# Patient Record
Sex: Female | Born: 1965 | Race: Asian | Hispanic: No | Smoking: Never smoker
Health system: Southern US, Community
[De-identification: ages and names within clinical notes are randomized; demographics above are authoritative.]

## PROBLEM LIST (undated history)

## (undated) DIAGNOSIS — E039 Hypothyroidism, unspecified: Secondary | ICD-10-CM

## (undated) DIAGNOSIS — D649 Anemia, unspecified: Secondary | ICD-10-CM

## (undated) DIAGNOSIS — J45909 Unspecified asthma, uncomplicated: Secondary | ICD-10-CM

## (undated) DIAGNOSIS — E079 Disorder of thyroid, unspecified: Secondary | ICD-10-CM

## (undated) HISTORY — DX: Hypothyroidism, unspecified: E03.9

## (undated) HISTORY — DX: Unspecified asthma, uncomplicated: J45.909

---

## 1993-01-21 HISTORY — PX: TUBAL LIGATION: SHX77

## 1995-01-22 HISTORY — PX: THYROID SURGERY: SHX805

## 1998-04-20 ENCOUNTER — Other Ambulatory Visit: Admission: RE | Admit: 1998-04-20 | Discharge: 1998-04-20 | Payer: Self-pay | Admitting: Obstetrics and Gynecology

## 1998-12-05 ENCOUNTER — Encounter: Admission: RE | Admit: 1998-12-05 | Discharge: 1998-12-05 | Payer: Self-pay | Admitting: Family Medicine

## 1998-12-05 ENCOUNTER — Encounter: Payer: Self-pay | Admitting: Family Medicine

## 2000-05-29 ENCOUNTER — Encounter: Admission: RE | Admit: 2000-05-29 | Discharge: 2000-05-29 | Payer: Self-pay | Admitting: Family Medicine

## 2000-05-29 ENCOUNTER — Encounter: Payer: Self-pay | Admitting: Family Medicine

## 2000-05-30 ENCOUNTER — Emergency Department (HOSPITAL_COMMUNITY): Admission: EM | Admit: 2000-05-30 | Discharge: 2000-05-30 | Payer: Self-pay

## 2000-06-02 ENCOUNTER — Encounter (INDEPENDENT_AMBULATORY_CARE_PROVIDER_SITE_OTHER): Payer: Self-pay | Admitting: Specialist

## 2000-06-02 ENCOUNTER — Ambulatory Visit (HOSPITAL_COMMUNITY): Admission: RE | Admit: 2000-06-02 | Discharge: 2000-06-02 | Payer: Self-pay | Admitting: Gastroenterology

## 2003-11-17 ENCOUNTER — Ambulatory Visit: Payer: Self-pay | Admitting: Internal Medicine

## 2004-05-16 ENCOUNTER — Ambulatory Visit: Payer: Self-pay | Admitting: Internal Medicine

## 2006-05-22 ENCOUNTER — Ambulatory Visit: Payer: Self-pay | Admitting: Cardiovascular Disease

## 2006-05-28 ENCOUNTER — Ambulatory Visit: Payer: Self-pay | Admitting: Internal Medicine

## 2006-06-03 ENCOUNTER — Ambulatory Visit: Payer: Self-pay

## 2006-06-03 ENCOUNTER — Encounter: Payer: Self-pay | Admitting: Cardiology

## 2006-06-03 LAB — CBC WITH DIFFERENTIAL/PLATELET
BASO%: 2.5 % — ABNORMAL HIGH (ref 0.0–2.0)
Basophils Absolute: 0.1 10*3/uL (ref 0.0–0.1)
EOS%: 2.5 % (ref 0.0–7.0)
Eosinophils Absolute: 0.1 10*3/uL (ref 0.0–0.5)
HCT: 32.9 % — ABNORMAL LOW (ref 34.8–46.6)
HGB: 9.8 g/dL — ABNORMAL LOW (ref 11.6–15.9)
LYMPH%: 36.6 % (ref 14.0–48.0)
MCH: 18 pg — ABNORMAL LOW (ref 26.0–34.0)
MCHC: 29.7 g/dL — ABNORMAL LOW (ref 32.0–36.0)
MCV: 60.6 fL — ABNORMAL LOW (ref 81.0–101.0)
MONO#: 0.3 10*3/uL (ref 0.1–0.9)
MONO%: 7.6 % (ref 0.0–13.0)
NEUT#: 2.1 10*3/uL (ref 1.5–6.5)
NEUT%: 50.8 % (ref 39.6–76.8)
Platelets: 294 10*3/uL (ref 145–400)
RBC: 5.44 10*6/uL — ABNORMAL HIGH (ref 3.70–5.32)
RDW: 15 % — ABNORMAL HIGH (ref 11.3–14.5)
WBC: 4.2 10*3/uL (ref 3.9–10.0)
lymph#: 1.5 10*3/uL (ref 0.9–3.3)

## 2006-06-05 LAB — COMPREHENSIVE METABOLIC PANEL
BUN: 11 mg/dL (ref 6–23)
CO2: 24 mEq/L (ref 19–32)
Creatinine, Ser: 0.79 mg/dL (ref 0.40–1.20)
Glucose, Bld: 87 mg/dL (ref 70–99)
Total Bilirubin: 1 mg/dL (ref 0.3–1.2)

## 2006-06-05 LAB — IRON AND TIBC
TIBC: 416 ug/dL (ref 250–470)
UIBC: 189 ug/dL

## 2006-06-05 LAB — VITAMIN B12: Vitamin B-12: 372 pg/mL (ref 211–911)

## 2006-06-05 LAB — PROTEIN ELECTROPHORESIS, SERUM
Albumin ELP: 58.9 % (ref 55.8–66.1)
Total Protein, Serum Electrophoresis: 7.1 g/dL (ref 6.0–8.3)

## 2006-06-05 LAB — FERRITIN: Ferritin: 30 ng/mL (ref 10–291)

## 2006-06-05 LAB — LACTATE DEHYDROGENASE: LDH: 110 U/L (ref 94–250)

## 2009-08-01 ENCOUNTER — Ambulatory Visit (HOSPITAL_COMMUNITY): Admission: RE | Admit: 2009-08-01 | Discharge: 2009-08-01 | Payer: Self-pay | Admitting: Family Medicine

## 2010-06-05 NOTE — Assessment & Plan Note (Signed)
[facility]                            CARDIOLOGY OFFICE NOTE   Geralynn Ochs              MRN:          [id number]  DATE:05/22/2006                            DOB:          [birth year]    Ms. Beverly Campbell is a pleasant 45 year old [place] refugee who is  referred for chest pain and shortness of breath.  Her chest pain is  atypical, she has had it for 6 months.  She feels a pinching in her  chest.  It is not associated with diaphoresis.  She has had increasing  exertional dyspnea.  She actually finds that when she goes to the gym or  bowls the pain is less noticeable.   The pain can be fleeting, it does not last but a minute or two.  However, she feels it has been progressive over the last 6 months.   She has not had any significant musculoskeletal trauma.   There has been no previous history of cardiac problems.  The patient  does have a history of anemia.  Apparently she saw a hematologist many  years ago.  She had lab work done at [facility].  Of note is a  significant anemia.  Her hemoglobin was only 8.5, her hematocrit was  26.8 and her MCV was quite low at 57.8.   The patient had attributed this in the past to a different reference  value for agents and heavy periods.   From a cardiac perspective otherwise, she has not had any significant  PND or orthopnea, she has not had any significant syncope or  palpitations.  She has noticed some increasing lower extremity edema  over the last week.   There is no history of DVT or PE.   REVIEW OF SYSTEMS:  Otherwise negative.   PAST MEDICAL HISTORY:  Remarkable for occasional headaches.   She is a nonsmoker, nondrinker.  She has no known allergies.  She has  had a previous thyroid nodule removed in 1988 and has been on thyroid  replacement.  She has had a previous tubal ligation.   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  She is divorced.  She has  3 children.  She enjoys bowling and walking  when she has time.   There is a little bit of stress in regards to everything that she does  in raising the 3 kids.   Her mother is alive and has high blood pressure at age 34.  Father died  of colon cancer at age 69.   MEDS:  1. Levoxyl 88 mcg a day.  2. Multivitamins.   She has not started iron yet.   EXAM:  Remarkable for a healthy appearing Asian woman in no distress.  Her vital signs are remarkable for a weight of 156, blood pressure is  114/70, she is not postural.  Pulse 73 and regular, she is afebrile,  respiratory rate is 12.  HEENT:  Normal.  She has a previous thyroidectomy scar.  There is no  thyromegaly currently.  There is no bruits, there is no JVP elevation.  LUNGS:  Clear.  She is not using  accessory muscles.  There is an S1, S2  with normal heart sounds.  PMI is normal.  ABDOMEN:  Benign, I do not feel any masses.  There is no  hepatosplenomegaly.  No AAA.  No hepatojugular reflux.  Distal pulses are intact.  There is no edema, there is no bruits.  Pulses are +3 bilaterally.  SKIN:  Normal in coloration.  There is no particular pallor.  NEUROLOGICAL EXAM:  Unremarkable.  MUSCULAR EXAM:  Normal.  I can actually find no edema on her despite her  subjective complaints.   Her electrocardiogram is totally normal.   I did review the remainder of her labs from [facility].  Her TSH was  normal and she did not have an elevated BUN to go with her anemia.   IMPRESSION:  The patient's chest pain is atypical, it is not always  exertional, it is sharp, it is fleeting, I do not think it is anginal.  If she does have anginal type pain it may actually be from her fairly  significant anemia.  She will be referred for stress Myoview testing.   I would also like to do a 2D echocardiogram to further assess her LV and  RV function in the setting of dyspnea.   More importantly, I would like to refer her to Hematology.  I  encouraged  her to start taking iron.  We will get a serum ferritin and iron level  as well as a TIBC and a retic count.   She clearly has a microcytic anemia which is somewhat profound to say  that it is only an ethnic variation with every menorrhagia.   The patient will follow up with me.  She will see the hematologist and  have further workup for anemia. We will check an Iron, TIBC, Ferritin  and Reticulocyte count    Peter C. Eden Emms, MD, [facility]  Electronically Signed   PCN/MedQ  DD: 05/22/2006  DT: 05/22/2006  Job #: [id number]   cc:   Gabriel Earing, M.D.

## 2010-06-08 NOTE — Procedures (Signed)
[place]  Patient:    Beverly Campbell, Beverly Campbell           MRN: [id number] Proc. Date: 06/02/00 Adm. Date:  [id number] Attending:  Orland Mustard CC:         Crista Luria, M.D., [facility]   Procedure Report  PROCEDURE:  Esophagogastroduodenoscopy and biopsy.  MEDICATIONS:  Hurricaine spray, fentanyl 20 mcg, Versed 3 mg IV.  INDICATION:  Patient with abdominal pain that has been persistent. Gallbladder ultrasound was negative.  DESCRIPTION OF PROCEDURE:  The procedure had been explained to the patient and consent obtained.  Patient in left lateral decubitus position, the Olympus video endoscope inserted blindly into the esophagus and advanced under direct visualization.  The stomach was reached.  The antrum revealed several areas of streaky gastritis and what appeared to be a small, healing ulceration near the junction of the body and the antrum.  It was quite superficial, it was one-third centimeter or so in diameter.  This was biopsied, and a test for Helicobacter was sent as well.  The duodenum, including the bulb and the second portion, was seen well and was unremarkable.  The scope was drawn back into the stomach with the original findings confirmed.  Fundus and cardia seen well on the retroflex view and were normal.  The distal and proximal esophagus were seen well upon withdrawal of the scope and were normal.  The scope was withdrawn.  The patient tolerated the procedure well and was maintained on low-flow oxygen and pulse oximetry throughout the procedure.  ASSESSMENT:  Gastritis with possible small gastric ulcer.  PLAN:  Will continue the patient on Aciphex and see back in the office in one month.  Have her call to report tests in one week, check on the results of the biopsies and pathology. DD:  06/02/00 TD:  06/03/00 Job: [id number]

## 2010-12-06 ENCOUNTER — Other Ambulatory Visit (HOSPITAL_COMMUNITY): Payer: Self-pay | Admitting: Family Medicine

## 2010-12-06 DIAGNOSIS — Z1231 Encounter for screening mammogram for malignant neoplasm of breast: Secondary | ICD-10-CM

## 2011-01-03 ENCOUNTER — Ambulatory Visit (HOSPITAL_COMMUNITY): Admission: RE | Admit: 2011-01-03 | Payer: Self-pay | Source: Ambulatory Visit

## 2011-06-10 ENCOUNTER — Other Ambulatory Visit (HOSPITAL_COMMUNITY): Payer: Self-pay | Admitting: Family Medicine

## 2011-06-10 DIAGNOSIS — Z1231 Encounter for screening mammogram for malignant neoplasm of breast: Secondary | ICD-10-CM

## 2011-07-09 ENCOUNTER — Ambulatory Visit (HOSPITAL_COMMUNITY)
Admission: RE | Admit: 2011-07-09 | Discharge: 2011-07-09 | Disposition: A | Discharge status: Home / Self Care | Payer: 59 | Source: Ambulatory Visit | Attending: Family Medicine | Admitting: Family Medicine

## 2011-07-09 DIAGNOSIS — Z1231 Encounter for screening mammogram for malignant neoplasm of breast: Secondary | ICD-10-CM

## 2012-08-20 ENCOUNTER — Other Ambulatory Visit: Payer: Self-pay

## 2012-08-20 ENCOUNTER — Encounter (HOSPITAL_BASED_OUTPATIENT_CLINIC_OR_DEPARTMENT_OTHER): Payer: Self-pay | Admitting: *Deleted

## 2012-08-20 ENCOUNTER — Emergency Department (HOSPITAL_BASED_OUTPATIENT_CLINIC_OR_DEPARTMENT_OTHER)
Admission: EM | Admit: 2012-08-20 | Discharge: 2012-08-20 | Disposition: A | Discharge status: Home / Self Care | Payer: 59 | Attending: Emergency Medicine | Admitting: Emergency Medicine

## 2012-08-20 ENCOUNTER — Emergency Department (HOSPITAL_BASED_OUTPATIENT_CLINIC_OR_DEPARTMENT_OTHER): Payer: 59

## 2012-08-20 DIAGNOSIS — Z79899 Other long term (current) drug therapy: Secondary | ICD-10-CM | POA: Insufficient documentation

## 2012-08-20 DIAGNOSIS — Z862 Personal history of diseases of the blood and blood-forming organs and certain disorders involving the immune mechanism: Secondary | ICD-10-CM | POA: Insufficient documentation

## 2012-08-20 DIAGNOSIS — R0789 Other chest pain: Secondary | ICD-10-CM | POA: Insufficient documentation

## 2012-08-20 DIAGNOSIS — E079 Disorder of thyroid, unspecified: Secondary | ICD-10-CM | POA: Insufficient documentation

## 2012-08-20 DIAGNOSIS — R079 Chest pain, unspecified: Secondary | ICD-10-CM

## 2012-08-20 HISTORY — DX: Disorder of thyroid, unspecified: E07.9

## 2012-08-20 HISTORY — DX: Anemia, unspecified: D64.9

## 2012-08-20 LAB — CBC WITH DIFFERENTIAL/PLATELET
Eosinophils Relative: 3 % (ref 0–5)
HCT: 31.7 % — ABNORMAL LOW (ref 36.0–46.0)
Lymphocytes Relative: 34 % (ref 12–46)
Lymphs Abs: 1.7 10*3/uL (ref 0.7–4.0)
MCV: 55.4 fL — ABNORMAL LOW (ref 78.0–100.0)
Monocytes Relative: 9 % (ref 3–12)
Neutro Abs: 2.7 10*3/uL (ref 1.7–7.7)
RBC: 5.72 MIL/uL — ABNORMAL HIGH (ref 3.87–5.11)
WBC: 4.9 10*3/uL (ref 4.0–10.5)

## 2012-08-20 LAB — COMPREHENSIVE METABOLIC PANEL
ALT: 14 U/L (ref 0–35)
CO2: 26 mEq/L (ref 19–32)
Calcium: 9.9 mg/dL (ref 8.4–10.5)
Chloride: 102 mEq/L (ref 96–112)
Creatinine, Ser: 0.8 mg/dL (ref 0.50–1.10)
GFR calc Af Amer: >90 mL/min (ref >90)
GFR calc non Af Amer: 87 mL/min — ABNORMAL LOW (ref >90)
Glucose, Bld: 98 mg/dL (ref 70–99)
Sodium: 140 mEq/L (ref 135–145)
Total Bilirubin: 0.9 mg/dL (ref 0.3–1.2)

## 2012-08-20 NOTE — ED Provider Notes (Signed)
CSN: [id number]     Arrival date & time 08/20/12  1500 History     First MD Initiated Contact with Patient 08/20/12 [phone]     Chief Complaint  Patient presents with  . Chest Pain   (Consider location/radiation/quality/duration/timing/severity/associated sxs/prior Treatment) HPI Comments: Patient sent from pcp office for evaluation of chest pain.  She tells me she had an episode of tightness in the chest that occurred last week and resolved after about 20 minutes.  Since that time she has had additional episodes of pressure in her throat and left side of her neck.  She denies shortness of breath, nausea, diaphoresis, or radiation to the arm or jaw.  She denies any exertional component.    Patient is a 47 y.o. female presenting with chest pain. The history is provided by the patient.  Chest Pain Pain location:  Substernal area Pain quality: throbbing   Pain radiates to:  Does not radiate Pain radiates to the back: no   Pain severity:  Moderate Onset quality:  Sudden Timing:  Intermittent Chronicity:  New Context: not breathing   Relieved by:  Nothing Worsened by:  Nothing tried   Past Medical History  Diagnosis Date  . Thyroid disease   . Anemia    History reviewed. No pertinent past surgical history. No family history on file. History  Substance Use Topics  . Smoking status: Never Smoker   . Smokeless tobacco: Not on file  . Alcohol Use: Yes   OB History   Grav Para Term Preterm Abortions TAB SAB Ect Mult Living                 Review of Systems  Cardiovascular: Positive for chest pain.  All other systems reviewed and are negative.    Allergies  Review of patient's allergies indicates no known allergies.  Home Medications   Current Outpatient Rx  Name  Route  Sig  Dispense  Refill  . ferrous sulfate 325 (65 FE) MG tablet   Oral   Take 325 mg by mouth daily with breakfast.         . levothyroxine (SYNTHROID, LEVOTHROID) 100 MCG tablet   Oral   Take 100  mcg by mouth daily before breakfast.          BP 130/90  Pulse 72  Temp(Src) 98.5 F (36.9 C) (Oral)  Resp 18  Wt 180 lb (81.647 kg)  SpO2 100%  LMP 08/17/2012 Physical Exam  Nursing note and vitals reviewed. Constitutional: She is oriented to person, place, and time. She appears well-developed and well-nourished. No distress.  HENT:  Head: Normocephalic and atraumatic.  Neck: Normal range of motion. Neck supple.  Cardiovascular: Normal rate and regular rhythm.  Exam reveals no gallop and no friction rub.   No murmur heard. Pulmonary/Chest: Effort normal and breath sounds normal. No respiratory distress. She has no wheezes.  Abdominal: Soft. Bowel sounds are normal. She exhibits no distension. There is no tenderness.  Musculoskeletal: Normal range of motion.  Neurological: She is alert and oriented to person, place, and time.  Skin: Skin is warm and dry. She is not diaphoretic.    ED Course   Procedures (including critical care time)  Labs Reviewed - No data to display No results found. No diagnosis found.   Date: 08/20/2012  Rate: 56  Rhythm: sinus bradycardia  QRS Axis: normal  Intervals: normal  ST/T Wave abnormalities: normal  Conduction Disutrbances:none  Narrative Interpretation:   Old EKG Reviewed: none available  MDM  The patient presents with symptoms that are atypical for cardiac pain. The workup was unremarkable including EKG and troponin. She has no cardiac risk factors and had a negative stress test performed approximately 3 years ago in [place]. She's been having discomfort in the past week and her troponin remains negative.  She will be discharged to home with instructions to take ibuprofen as an anti-inflammatory and for her discomfort. She is to followup with cardiology if her symptoms are not resolving in the next few days and return to the ER if her symptoms worsen or change.   Geoffery Lyons, MD 08/20/12 [phone]

## 2012-08-20 NOTE — ED Notes (Signed)
Chest pain last week. Today she is having pain in her throat and soreness in her chest. She had an EKG at [facility] today that was sinus bradycardia. Heaviness in her head and chest. She feels tired.

## 2013-03-19 ENCOUNTER — Telehealth: Payer: Self-pay | Admitting: Internal Medicine

## 2013-03-19 NOTE — Telephone Encounter (Signed)
S/W PATIENT AND GAVE NEW PATIENT APPT FOR 03/11 @ 10:30 W/DR. CHISM REFERRING DR. Rosana Hoes CLOWARD DX- CONSIDER FOR IV IRON THERAPY WELCOME PACKET MAILED.

## 2013-03-29 ENCOUNTER — Other Ambulatory Visit (HOSPITAL_BASED_OUTPATIENT_CLINIC_OR_DEPARTMENT_OTHER): Payer: 59

## 2013-03-29 ENCOUNTER — Ambulatory Visit: Payer: 59

## 2013-03-29 ENCOUNTER — Encounter: Payer: Self-pay | Admitting: Internal Medicine

## 2013-03-29 ENCOUNTER — Ambulatory Visit (HOSPITAL_BASED_OUTPATIENT_CLINIC_OR_DEPARTMENT_OTHER): Payer: 59 | Admitting: Internal Medicine

## 2013-03-29 ENCOUNTER — Other Ambulatory Visit: Payer: Self-pay | Admitting: Internal Medicine

## 2013-03-29 ENCOUNTER — Encounter (INDEPENDENT_AMBULATORY_CARE_PROVIDER_SITE_OTHER): Payer: Self-pay

## 2013-03-29 VITALS — BP 128/80 | HR 89 | Temp 98.5°F | Resp 18 | Wt 180.7 lb

## 2013-03-29 DIAGNOSIS — D649 Anemia, unspecified: Secondary | ICD-10-CM

## 2013-03-29 DIAGNOSIS — D509 Iron deficiency anemia, unspecified: Secondary | ICD-10-CM

## 2013-03-29 LAB — CBC & DIFF AND RETIC
BASO%: 1.1 % (ref 0.0–2.0)
BASOS ABS: 0.1 10*3/uL (ref 0.0–0.1)
EOS ABS: 0.2 10*3/uL (ref 0.0–0.5)
EOS%: 4.5 % (ref 0.0–7.0)
HCT: 31.8 % — ABNORMAL LOW (ref 34.8–46.6)
HEMOGLOBIN: 10.4 g/dL — AB (ref 11.6–15.9)
IMMATURE RETIC FRACT: 14.3 % — AB (ref 1.60–10.00)
LYMPH#: 1.4 10*3/uL (ref 0.9–3.3)
LYMPH%: 25.9 % (ref 14.0–49.7)
MCH: 20.1 pg — ABNORMAL LOW (ref 25.1–34.0)
MCHC: 32.7 g/dL (ref 31.5–36.0)
MCV: 61.4 fL — AB (ref 79.5–101.0)
MONO#: 0.4 10*3/uL (ref 0.1–0.9)
MONO%: 7.7 % (ref 0.0–14.0)
NEUT%: 60.8 % (ref 38.4–76.8)
NEUTROS ABS: 3.2 10*3/uL (ref 1.5–6.5)
Platelets: 225 10*3/uL (ref 145–400)
RBC: 5.18 10*6/uL (ref 3.70–5.45)
RDW: 15.8 % — AB (ref 11.2–14.5)
RETIC CT ABS: 177.67 10*3/uL — AB (ref 33.70–90.70)
Retic %: 3.43 % — ABNORMAL HIGH (ref 0.70–2.10)
WBC: 5.3 10*3/uL (ref 3.9–10.3)

## 2013-03-29 LAB — TECHNOLOGIST REVIEW

## 2013-03-29 LAB — COMPREHENSIVE METABOLIC PANEL (CC13)
ALBUMIN: 3.8 g/dL (ref 3.5–5.0)
ALT: 13 U/L (ref 0–55)
AST: 12 U/L (ref 5–34)
Alkaline Phosphatase: 50 U/L (ref 40–150)
Anion Gap: 9 mEq/L (ref 3–11)
BUN: 13 mg/dL (ref 7.0–26.0)
CALCIUM: 8.9 mg/dL (ref 8.4–10.4)
CHLORIDE: 105 meq/L (ref 98–109)
CO2: 26 mEq/L (ref 22–29)
Creatinine: 0.8 mg/dL (ref 0.6–1.1)
Glucose: 99 mg/dl (ref 70–140)
POTASSIUM: 3.9 meq/L (ref 3.5–5.1)
Sodium: 141 mEq/L (ref 136–145)
Total Bilirubin: 0.87 mg/dL (ref 0.20–1.20)
Total Protein: 6.8 g/dL (ref 6.4–8.3)

## 2013-03-29 LAB — IRON AND TIBC CHCC
%SAT: 14 % — ABNORMAL LOW (ref 21–57)
Iron: 49 ug/dL (ref 41–142)
TIBC: 359 ug/dL (ref 236–444)
UIBC: 309 ug/dL (ref 120–384)

## 2013-03-29 LAB — FERRITIN CHCC: Ferritin: 22 ng/ml (ref 9–269)

## 2013-03-29 LAB — CHCC SMEAR

## 2013-03-29 LAB — LACTATE DEHYDROGENASE (CC13): LDH: 128 U/L (ref 125–245)

## 2013-03-29 NOTE — Progress Notes (Signed)
Checked in new pt with no financial concerns. °

## 2013-03-29 NOTE — Patient Instructions (Signed)
Ferumoxytol injection What is this medicine? FERUMOXYTOL is an iron complex. Iron is used to make healthy red blood cells, which carry oxygen and nutrients throughout the body. This medicine is used to treat iron deficiency anemia in people with chronic kidney disease. This medicine may be used for other purposes; ask your health care provider or pharmacist if you have questions. COMMON BRAND NAME(S): Feraheme  What should I tell my health care provider before I take this medicine? They need to know if you have any of these conditions: -anemia not caused by low iron levels -high levels of iron in the blood -magnetic resonance imaging (MRI) test scheduled -an unusual or allergic reaction to iron, other medicines, foods, dyes, or preservatives -pregnant or trying to get pregnant -breast-feeding How should I use this medicine? This medicine is for injection into a vein. It is given by a health care professional in a hospital or clinic setting. Talk to your pediatrician regarding the use of this medicine in children. Special care may be needed. Overdosage: If you think you've taken too much of this medicine contact a poison control center or emergency room at once. Overdosage: If you think you have taken too much of this medicine contact a poison control center or emergency room at once. NOTE: This medicine is only for you. Do not share this medicine with others. What if I miss a dose? It is important not to miss your dose. Call your doctor or health care professional if you are unable to keep an appointment. What may interact with this medicine? This medicine may interact with the following medications: -other iron products This list may not describe all possible interactions. Give your health care provider a list of all the medicines, herbs, non-prescription drugs, or dietary supplements you use. Also tell them if you smoke, drink alcohol, or use illegal drugs. Some items may interact with your  medicine. What should I watch for while using this medicine? Visit your doctor or healthcare professional regularly. Tell your doctor or healthcare professional if your symptoms do not start to get better or if they get worse. You may need blood work done while you are taking this medicine. You may need to follow a special diet. Talk to your doctor. Foods that contain iron include: whole grains/cereals, dried fruits, beans, or peas, leafy green vegetables, and organ meats (liver, kidney). What side effects may I notice from receiving this medicine? Side effects that you should report to your doctor or health care professional as soon as possible: -allergic reactions like skin rash, itching or hives, swelling of the face, lips, or tongue -breathing problems -changes in blood pressure -feeling faint or lightheaded, falls -fever or chills -flushing, sweating, or hot feelings -swelling of the ankles or feet Side effects that usually do not require medical attention (Report these to your doctor or health care professional if they continue or are bothersome.): -diarrhea -headache -nausea, vomiting -stomach pain This list may not describe all possible side effects. Call your doctor for medical advice about side effects. You may report side effects to FDA at 1-800-FDA-1088. Where should I keep my medicine? This drug is given in a hospital or clinic and will not be stored at home. NOTE: This sheet is a summary. It may not cover all possible information. If you have questions about this medicine, talk to your doctor, pharmacist, or health care provider.  2014, Elsevier/Gold Standard. (2011-08-23 15:23:36) Iron Deficiency Anemia, Adult Anemia is a condition in which there are less  red blood cells or hemoglobin in the blood than normal. Hemoglobin is this part of red blood cells that carries oxygen. Iron deficiency anemia is anemia caused by too little iron. It is the most common type of anemia. It may  leave you tired and short of breath. CAUSES   Lack of iron in the diet.  Poor absorption of iron, as seen with intestinal disorders.  Intestinal bleeding.  Heavy periods. SIGNS AND SYMPTOMS  Mild anemia may not be noticeable. Symptoms may include:  Fatigue.  Headache.  Pale skin.  Weakness.  Tiredness.  Shortness of breath.  Dizziness.  Cold hands and feet.  Fast or irregular heartbeat. DIAGNOSIS  Diagnosis requires a thorough evaluation and physical exam by your health care provider. Blood tests are generally used to confirm iron deficiency anemia. Additional tests may be done to find the underlying cause of your anemia. These may include:  Testing for blood in the stool (fecal occult blood test).  A procedure to see inside the colon and rectum (colonoscopy).  A procedure to see inside the esophagus and stomach (endoscopy). TREATMENT  Iron deficiency anemia is treated by correcting the cause of the deficiency. Treatment may involve:  Adding iron-rich foods to your diet.  Taking iron supplements. Pregnant or breastfeeding women need to take extra iron, because their normal diet usually does not provide the required amount.  Taking vitamins. Vitamin C improves the absorption of iron. Your health care provider may recommend taking your iron tablets with a glass of orange juice or vitamin C supplement.  Medicines to make heavy menstrual flow lighter.  Surgery. HOME CARE INSTRUCTIONS   Take iron as directed by your health care provider.  If you cannot tolerate taking iron supplements by mouth, talk to your health care provider about taking them through a vein (intravenously) or an injection into a muscle.  For the best iron absorption, iron supplements should be taken on an empty stomach. If you cannot tolerate them on an empty stomach, you may need to take them with food.  Do not drink milk or take antacids at the same time as your iron supplements. Milk and  antacids may interfere with the absorption of iron.  Iron supplements can cause constipation. Make sure to include fiber in your diet to prevent constipation. A stool softener may also be recommended.  Take vitamins as directed by your health care provider.  Eat a diet rich in iron. Foods high in iron include liver, lean beef, whole-grain bread, eggs, dried fruit, and dark green, leafy vegetables. SEEK IMMEDIATE MEDICAL CARE IF:   You faint. If this happens, do not drive. Call your local emergency services (911 in [place] if no other help is available.  You have chest pain.  You feel nauseous or vomit.  You have severe or increased shortness of breath with activity.  You feel weak.  You have a rapid heartbeat.  You have unexplained sweating.  You become lightheaded when getting up from a chair or bed. MAKE SURE YOU:   Understand these instructions.  Will watch your condition.  Will get help right away if you are not doing well or get worse. Document Released: 01/05/2000 Document Revised: 10/28/2012 Document Reviewed: 09/14/2012 [facility] Patient Information 2014 [place]. Ferritin This is done to test for anemia. Anemia occurs when the amount of hemoglobin (found in the red blood cells) drops below normal. Hemoglobin is necessary for the transportation of oxygen throughout the body. Blood tests may show a variety of  common, treatable abnormalities that can lead to problems associated with anemia. Iron deficiency anemia is the most common of the anemias. It is usually due to bleeding. In women, iron deficiency may be due to heavy menstrual periods. In older women and in men, the bleeding is usually from disease of the intestines. In children and in pregnant women, the body needs more iron. Iron deficiency may be due to simply not eating enough iron in the diet. Iron deficiency may also result from some extreme diets. Treatment of iron deficiency usually involves iron supplements.  In older women and in men, there is usually some further testing needed to determine why the person is iron deficient.  PREPARATION FOR TEST A blood sample is obtained by inserting a needle into a vein in the arm. NORMAL FINDINGS Female: 12-300ng/ml or 12-300  g/L (SI units) Female:  10-150 ng/ml or 10-150  g/L (SI units) Children/adolescent:  Newborn: 25-200 ng/ml  1 month: 200-600 ng/ml  2-5 months: 50-200 ng/ml  6 months-15 years: 7-142 ng/ml Ranges for normal findings may vary among different laboratories and hospitals. You should always check with your doctor after having lab work or other tests done to discuss the meaning of your test results and whether your values are considered within normal limits. MEANING OF TEST  Your caregiver will go over the test results with you and discuss the importance and meaning of your results, as well as treatment options and the need for additional tests if necessary. OBTAINING THE TEST RESULTS  It is your responsibility to obtain your test results. Ask the lab or department performing the test when and how you will get your results. Document Released: 01/31/2004 Document Revised: 04/01/2011 Document Reviewed: 12/18/2007 [facility] Patient Information 2014 [place], [place].

## 2013-03-31 ENCOUNTER — Telehealth: Payer: Self-pay | Admitting: Internal Medicine

## 2013-03-31 NOTE — Telephone Encounter (Signed)
, °

## 2013-03-31 NOTE — Progress Notes (Signed)
[place] [phone]   [phone]  NEW PATIENT EVALUATION   Name: Beverly Campbell Date: 03/31/2013 MRN: [id number] DOB: Feb 08, 1965  PCP: Christie Nottingham., MD   REFERRING PHYSICIAN: Christie Nottingham, MD  REASON FOR REFERRAL: Iron deficency anemia (IDA)    HISTORY OF PRESENT ILLNESS:Beverly Campbell is a 48 y.o. female who is .[place] refugee who is being referred to our office for IDA.  She has been referred to hematology several years ago based on symptoms of anemia.  Her hemoglobin was 8.5, hematocrit of 26.6 and MCV of 57.8 at that time.  She attribute her IDA to menorrhagia.  She has been seen by gynecology and reports having an ultrasound consistent with fibroids.  She also reports that they discussed endometrial ablation but she declined previously.  She report long-standing history of menorrhagia with her last period last week.  She reports it last 5-7 days with heavy flow (up to 7-10 pads per day) doing the first few days.  She also reports feeling lightheadedness doing her periods.  In fact, she was recently evaluated for dizziness and vertigo symptoms.  She was treated with meclizine and diagnosed with benign positional vertigo.  However, she is started to have copious diarrhea on this medication.  She denies picca or pagophagia.  She has had a previous thyroid nodule removed in 1988 and has been on thyroid replacement.  She denies a family history of anemia.   PAST MEDICAL HISTORY:  has a past medical history of Thyroid disease; Anemia; and Hypothyroidism.     PAST SURGICAL HISTORY:No past surgical history on file.   CURRENT MEDICATIONS: has a current medication list which includes the following prescription(s): ferrous sulfate, levothyroxine, meclizine, and ondansetron.   ALLERGIES: Review of patient's allergies indicates no known allergies.   SOCIAL HISTORY:  reports that she has never smoked. She does not have any  smokeless tobacco history on file. She reports that she drinks alcohol. She reports that she does not use illicit drugs.   FAMILY HISTORY: Mother is alive and has HTN at age 48.  Father died of colon cancer at age 75.   LABORATORY DATA:  Results for orders placed in visit on 03/29/13 (from the past 48 hour(s))  [facility] SMEAR     Status: None   Collection Time    03/29/13 11:16 AM      Result Value Ref Range   Smear Result Smear Available    CBC & DIFF AND RETIC     Status: Abnormal   Collection Time    03/29/13 11:16 AM      Result Value Ref Range   WBC 5.3  3.9 - 10.3 10e3/uL   NEUT# 3.2  1.5 - 6.5 10e3/uL   HGB 10.4 (*) 11.6 - 15.9 g/dL   HCT 31.8 (*) 34.8 - 46.6 %   Platelets 225  145 - 400 10e3/uL   MCV 61.4 (*) 79.5 - 101.0 fL   MCH 20.1 (*) 25.1 - 34.0 pg   MCHC 32.7  31.5 - 36.0 g/dL   RBC 5.18  3.70 - 5.45 10e6/uL   RDW 15.8 (*) 11.2 - 14.5 %   lymph# 1.4  0.9 - 3.3 10e3/uL   MONO# 0.4  0.1 - 0.9 10e3/uL   Eosinophils Absolute 0.2  0.0 - 0.5 10e3/uL   Basophils Absolute 0.1  0.0 - 0.1 10e3/uL   NEUT% 60.8  38.4 - 76.8 %   LYMPH% 25.9  14.0 - 49.7 %  MONO% 7.7  0.0 - 14.0 %   EOS% 4.5  0.0 - 7.0 %   BASO% 1.1  0.0 - 2.0 %   Retic % 3.43 (*) 0.70 - 2.10 %   Retic Ct Abs 177.67 (*) 33.70 - 90.70 10e3/uL   Immature Retic Fract 14.30 (*) 1.60 - 10.00 %  FERRITIN [facility]     Status: None   Collection Time    03/29/13 11:16 AM      Result Value Ref Range   Ferritin 22  9 - 269 ng/ml  TECHNOLOGIST REVIEW     Status: None   Collection Time    03/29/13 11:16 AM      Result Value Ref Range   Technologist Review       Value: Occ Large & giant platelets, moderate target cells, few helmets and ovalocytes  LACTATE DEHYDROGENASE (CC13)     Status: None   Collection Time    03/29/13 11:16 AM      Result Value Ref Range   LDH 128  125 - 245 U/L  COMPREHENSIVE METABOLIC PANEL (YW73)     Status: None   Collection Time    03/29/13 11:16 AM      Result Value Ref Range   Sodium  141  136 - 145 mEq/L   Potassium 3.9  3.5 - 5.1 mEq/L   Chloride 105  98 - 109 mEq/L   CO2 26  22 - 29 mEq/L   Glucose 99  70 - 140 mg/dl   BUN 13.0  7.0 - 26.0 mg/dL   Creatinine 0.8  0.6 - 1.1 mg/dL   Total Bilirubin 0.87  0.20 - 1.20 mg/dL   Alkaline Phosphatase 50  40 - 150 U/L   AST 12  5 - 34 U/L   ALT 13  0 - 55 U/L   Total Protein 6.8  6.4 - 8.3 g/dL   Albumin 3.8  3.5 - 5.0 g/dL   Calcium 8.9  8.4 - 10.4 mg/dL   Anion Gap 9  3 - 11 mEq/L  IRON AND TIBC [facility]     Status: Abnormal   Collection Time    03/29/13 11:17 AM      Result Value Ref Range   Iron 49  41 - 142 ug/dL   TIBC 359  236 - 444 ug/dL   UIBC 309  120 - 384 ug/dL   %SAT 14 (*) 21 - 57 %       RADIOGRAPHY: No results found.     REVIEW OF SYSTEMS:  Constitutional: Denies fevers, chills or abnormal weight loss Eyes: Denies blurriness of vision Ears, nose, mouth, throat, and face: Denies mucositis or sore throat Respiratory: Denies cough, dyspnea or wheezes Cardiovascular: Denies palpitation, chest discomfort or lower extremity swelling Gastrointestinal:  Denies nausea, heartburn or change in bowel habits Skin: Denies abnormal skin rashes Lymphatics: Denies new lymphadenopathy or easy bruising Neurological:Denies numbness, tingling or new weaknesses; she does report dizziness.  Behavioral/Psych: Mood is stable, no new changes  All other systems were reviewed with the patient and are negative.  PHYSICAL EXAM:  weight is 180 lb 11.2 oz (81.965 kg). Her oral temperature is 98.5 F (36.9 C). Her blood pressure is 128/80 and her pulse is 89. Her respiration is 18.    GENERAL:alert, no distress and comfortable SKIN: skin color, texture, turgor are normal, no rashes or significant lesions;  EYES: normal, Conjunctiva are pink and non-injected, sclera clear OROPHARYNX:no exudate, no erythema and lips, buccal mucosa,  and tongue normal  NECK: supple, thyroidectomy scar, non-tender, without nodularity LYMPH:  no  palpable lymphadenopathy in the cervical, axillary or inguinal LUNGS: clear to auscultation and percussion with normal breathing effort HEART: regular rate & rhythm and no murmurs and no lower extremity edema ABDOMEN:abdomen soft, non-tender and normal bowel sounds Musculoskeletal:no cyanosis of digits and no clubbing  NEURO: alert & oriented x 3 with fluent speech, no focal motor/sensory deficits   IMPRESSION: Brigid Blue is a 48 y.o. female with a history of    PLAN:  1.  IDA.   --We reviewed her labs consistent with moderate IDA.  We will facilitate receipt of intravenous feraheme given her persistence in her microcytic, hypoproliferative anemia.  She was instructed on the symptoms of anemia including  Palpitations, chest pain or discomfort.   She will continue her oral ferrous sulfate 325 mg bid.   She has also been referred to Gynecology to determine if endometrial ablation is still warranted.  Her ferritin is 22; Hemoglobin of 10.4 with MCV of 61.4; RDW of 15.8.  Technologist review of her peripheral smear revealed occasional large and giant platelets, moderate target cells, few helmets and ovalocytes.   2. Follow up.  --We will repeat labs monthly including iron indices and have her return for follow up in 2 months.   All questions were answered. The patient knows to call the clinic with any problems, questions or concerns. We can certainly see the patient much sooner if necessary.  I spent 25 minutes counseling the patient face to face. The total time spent in the appointment was 45 minutes.    Fatoumata Albaugh, MD 03/31/2013 5:26 AM

## 2013-04-01 ENCOUNTER — Ambulatory Visit: Payer: 59

## 2013-04-01 ENCOUNTER — Telehealth: Payer: Self-pay | Admitting: *Deleted

## 2013-04-01 NOTE — Telephone Encounter (Signed)
Patient called and left message to cancel her appt for this afternoon. I have canceled appts and left message to call me back to reschedule.  JMW

## 2013-04-01 NOTE — Telephone Encounter (Signed)
Patient called back and moved her appt from tomorrow to Monday

## 2013-04-05 ENCOUNTER — Telehealth: Payer: Self-pay | Admitting: Internal Medicine

## 2013-04-05 ENCOUNTER — Ambulatory Visit (HOSPITAL_BASED_OUTPATIENT_CLINIC_OR_DEPARTMENT_OTHER): Payer: 59

## 2013-04-05 VITALS — BP 115/76 | HR 66 | Temp 98.5°F | Resp 18

## 2013-04-05 DIAGNOSIS — D509 Iron deficiency anemia, unspecified: Secondary | ICD-10-CM

## 2013-04-05 MED ORDER — FERUMOXYTOL INJECTION 510 MG/17 ML
1020.0000 mg | Freq: Once | INTRAVENOUS | Status: AC
  Administered 2013-04-05: 1020 mg via INTRAVENOUS
  Filled 2013-04-05: qty 34

## 2013-04-05 MED ORDER — SODIUM CHLORIDE 0.9 % IV SOLN
Freq: Once | INTRAVENOUS | Status: AC
  Administered 2013-04-05: 09:00:00 via INTRAVENOUS

## 2013-04-05 NOTE — Patient Instructions (Signed)
(  Feraheme) Ferumoxytol injection What is this medicine? FERUMOXYTOL is an iron complex. Iron is used to make healthy red blood cells, which carry oxygen and nutrients throughout the body. This medicine is used to treat iron deficiency anemia in people with chronic kidney disease. This medicine may be used for other purposes; ask your health care provider or pharmacist if you have questions. COMMON BRAND NAME(S): Feraheme  What should I tell my health care provider before I take this medicine? They need to know if you have any of these conditions: -anemia not caused by low iron levels -high levels of iron in the blood -magnetic resonance imaging (MRI) test scheduled -an unusual or allergic reaction to iron, other medicines, foods, dyes, or preservatives -pregnant or trying to get pregnant -breast-feeding How should I use this medicine? This medicine is for injection into a vein. It is given by a health care professional in a hospital or clinic setting. Talk to your pediatrician regarding the use of this medicine in children. Special care may be needed. Overdosage: If you think you've taken too much of this medicine contact a poison control center or emergency room at once. Overdosage: If you think you have taken too much of this medicine contact a poison control center or emergency room at once. NOTE: This medicine is only for you. Do not share this medicine with others. What if I miss a dose? It is important not to miss your dose. Call your doctor or health care professional if you are unable to keep an appointment. What may interact with this medicine? This medicine may interact with the following medications: -other iron products This list may not describe all possible interactions. Give your health care provider a list of all the medicines, herbs, non-prescription drugs, or dietary supplements you use. Also tell them if you smoke, drink alcohol, or use illegal drugs. Some items may  interact with your medicine. What should I watch for while using this medicine? Visit your doctor or healthcare professional regularly. Tell your doctor or healthcare professional if your symptoms do not start to get better or if they get worse. You may need blood work done while you are taking this medicine. You may need to follow a special diet. Talk to your doctor. Foods that contain iron include: whole grains/cereals, dried fruits, beans, or peas, leafy green vegetables, and organ meats (liver, kidney). What side effects may I notice from receiving this medicine? Side effects that you should report to your doctor or health care professional as soon as possible: -allergic reactions like skin rash, itching or hives, swelling of the face, lips, or tongue -breathing problems -changes in blood pressure -feeling faint or lightheaded, falls -fever or chills -flushing, sweating, or hot feelings -swelling of the ankles or feet Side effects that usually do not require medical attention (Report these to your doctor or health care professional if they continue or are bothersome.): -diarrhea -headache -nausea, vomiting -stomach pain This list may not describe all possible side effects. Call your doctor for medical advice about side effects. You may report side effects to FDA at 1-800-FDA-1088. Where should I keep my medicine? This drug is given in a hospital or clinic and will not be stored at home. NOTE: This sheet is a summary. It may not cover all possible information. If you have questions about this medicine, talk to your doctor, pharmacist, or health care provider.  2014, [employer]vier/Gold Standard. (2011-08-23 15:23:36)

## 2013-04-13 ENCOUNTER — Ambulatory Visit: Payer: Self-pay | Admitting: Physician Assistant

## 2013-04-26 ENCOUNTER — Other Ambulatory Visit (HOSPITAL_BASED_OUTPATIENT_CLINIC_OR_DEPARTMENT_OTHER): Payer: 59

## 2013-04-26 ENCOUNTER — Other Ambulatory Visit: Payer: 59

## 2013-04-26 DIAGNOSIS — D509 Iron deficiency anemia, unspecified: Secondary | ICD-10-CM

## 2013-04-26 LAB — IRON AND TIBC CHCC
%SAT: 45 % (ref 21–57)
IRON: 117 ug/dL (ref 41–142)
TIBC: 258 ug/dL (ref 236–444)
UIBC: 141 ug/dL (ref 120–384)

## 2013-04-26 LAB — CBC WITH DIFFERENTIAL/PLATELET
BASO%: 2.1 % — ABNORMAL HIGH (ref 0.0–2.0)
BASOS ABS: 0.1 10*3/uL (ref 0.0–0.1)
EOS%: 4.3 % (ref 0.0–7.0)
Eosinophils Absolute: 0.2 10*3/uL (ref 0.0–0.5)
HCT: 32.3 % — ABNORMAL LOW (ref 34.8–46.6)
HEMOGLOBIN: 10.4 g/dL — AB (ref 11.6–15.9)
LYMPH#: 1.6 10*3/uL (ref 0.9–3.3)
LYMPH%: 30.7 % (ref 14.0–49.7)
MCH: 20.4 pg — AB (ref 25.1–34.0)
MCHC: 32.2 g/dL (ref 31.5–36.0)
MCV: 63.4 fL — ABNORMAL LOW (ref 79.5–101.0)
MONO#: 0.5 10*3/uL (ref 0.1–0.9)
MONO%: 9 % (ref 0.0–14.0)
NEUT%: 53.9 % (ref 38.4–76.8)
NEUTROS ABS: 2.9 10*3/uL (ref 1.5–6.5)
Platelets: 196 10*3/uL (ref 145–400)
RBC: 5.09 10*6/uL (ref 3.70–5.45)
RDW: 17.3 % — AB (ref 11.2–14.5)
WBC: 5.3 10*3/uL (ref 3.9–10.3)

## 2013-04-26 LAB — FERRITIN CHCC: FERRITIN: 273 ng/mL — AB (ref 9–269)

## 2013-05-10 ENCOUNTER — Ambulatory Visit: Payer: Self-pay

## 2013-05-24 ENCOUNTER — Ambulatory Visit: Payer: 59

## 2013-05-24 ENCOUNTER — Other Ambulatory Visit: Payer: 59

## 2013-05-24 ENCOUNTER — Other Ambulatory Visit (HOSPITAL_BASED_OUTPATIENT_CLINIC_OR_DEPARTMENT_OTHER): Payer: 59

## 2013-05-24 ENCOUNTER — Telehealth: Payer: Self-pay | Admitting: Internal Medicine

## 2013-05-24 ENCOUNTER — Ambulatory Visit (HOSPITAL_BASED_OUTPATIENT_CLINIC_OR_DEPARTMENT_OTHER): Payer: 59 | Admitting: Internal Medicine

## 2013-05-24 VITALS — BP 118/81 | HR 70 | Temp 97.7°F | Resp 18 | Ht 61.0 in | Wt 176.5 lb

## 2013-05-24 DIAGNOSIS — N920 Excessive and frequent menstruation with regular cycle: Secondary | ICD-10-CM

## 2013-05-24 DIAGNOSIS — D509 Iron deficiency anemia, unspecified: Secondary | ICD-10-CM

## 2013-05-24 LAB — CBC WITH DIFFERENTIAL/PLATELET
BASO%: 2.7 % — ABNORMAL HIGH (ref 0.0–2.0)
Basophils Absolute: 0.1 10*3/uL (ref 0.0–0.1)
EOS%: 4.6 % (ref 0.0–7.0)
Eosinophils Absolute: 0.2 10*3/uL (ref 0.0–0.5)
HCT: 32.4 % — ABNORMAL LOW (ref 34.8–46.6)
HGB: 10.6 g/dL — ABNORMAL LOW (ref 11.6–15.9)
LYMPH%: 35.5 % (ref 14.0–49.7)
MCH: 21 pg — ABNORMAL LOW (ref 25.1–34.0)
MCHC: 32.7 g/dL (ref 31.5–36.0)
MCV: 64 fL — ABNORMAL LOW (ref 79.5–101.0)
MONO#: 0.5 10*3/uL (ref 0.1–0.9)
MONO%: 10.3 % (ref 0.0–14.0)
NEUT#: 2.1 10*3/uL (ref 1.5–6.5)
NEUT%: 46.9 % (ref 38.4–76.8)
Platelets: 213 10*3/uL (ref 145–400)
RBC: 5.06 10*6/uL (ref 3.70–5.45)
RDW: 17.9 % — ABNORMAL HIGH (ref 11.2–14.5)
WBC: 4.5 10*3/uL (ref 3.9–10.3)
lymph#: 1.6 10*3/uL (ref 0.9–3.3)

## 2013-05-24 LAB — IRON AND TIBC CHCC
%SAT: 48 % (ref 21–57)
Iron: 127 ug/dL (ref 41–142)
TIBC: 265 ug/dL (ref 236–444)
UIBC: 138 ug/dL (ref 120–384)

## 2013-05-24 LAB — FERRITIN CHCC: Ferritin: 108 ng/mL (ref 9–269)

## 2013-05-24 NOTE — Progress Notes (Signed)
Beverly [facility] OFFICE PROGRESS NOTE  Beverly Campbell., MD [place] [address]  DIAGNOSIS: IDA (iron deficiency anemia)  Chief Complaint  Patient presents with  . Follow-up    CURRENT TREATMENT:  She received feraheme 1,020 mg on 04/05/2013.  Ferrous Sulfate 325 mg bid.   INTERVAL HISTORY: Beverly Campbell 48 y.o. female  [place] refugee who was referred to our office for IDA and seen on 03/29/2013. She reports continued heavy menstruation.  Her LMP started on 04/23 and stopped on 04/29.  She used 10 pads per day for the first few days.  She uses tampons and pads for the first three days due it being heavy with changes in pads every other hour.  She had an ultrasound consistent with uterine fibroids and is scheduled for vaginal ultrasound on the 14th of May with [facility]. She denies picca, shortness of breath, chest pain.    MEDICAL HISTORY: Past Medical History  Diagnosis Date  . Thyroid disease   . Anemia   . Hypothyroidism     INTERIM HISTORY: has IDA (iron deficiency anemia) on her problem list.    ALLERGIES:  has No Known Allergies.  MEDICATIONS: has a current medication list which includes the following prescription(s): ferrous sulfate, levothyroxine, meclizine, and ondansetron.  SURGICAL HISTORY: No past surgical history on file.  REVIEW OF SYSTEMS:   Constitutional: Denies fevers, chills or abnormal weight loss Eyes: Denies blurriness of vision Ears, nose, mouth, throat, and face: Denies mucositis or sore throat Respiratory: Denies cough, dyspnea or wheezes Cardiovascular: Denies palpitation, chest discomfort or lower extremity swelling Gastrointestinal:  Denies nausea, heartburn or change in bowel habits Skin: Denies abnormal skin rashes Lymphatics: Denies new lymphadenopathy or easy bruising Neurological:Denies numbness, tingling or new weaknesses Behavioral/Psych: Mood is stable, no new changes  All other systems were  reviewed with the patient and are negative.  PHYSICAL EXAMINATION: ECOG PERFORMANCE STATUS: 0 - Asymptomatic  Blood pressure 118/81, pulse 70, temperature 97.7 F (36.5 C), temperature source Oral, resp. rate 18, height 5\' 1"  (1.549 m), weight 176 lb 8 oz (80.06 kg), SpO2 100.00%.  GENERAL:alert, no distress and comfortable  SKIN: skin color, texture, turgor are normal, no rashes or significant lesions;  EYES: normal, Conjunctiva are pink and non-injected, sclera clear  OROPHARYNX:no exudate, no erythema and lips, buccal mucosa, and tongue normal  NECK: supple, thyroidectomy scar, non-tender, without nodularity  LYMPH: no palpable lymphadenopathy in the cervical, axillary or inguinal  LUNGS: clear to auscultation and percussion with normal breathing effort  HEART: regular rate & rhythm and no murmurs and no lower extremity edema  ABDOMEN:abdomen soft, non-tender and normal bowel sounds  Musculoskeletal:no cyanosis of digits and no clubbing  NEURO: alert & oriented x 3 with fluent speech, no focal motor/sensory deficits  Labs:  Lab Results  Component Value Date   WBC 4.5 05/24/2013   HGB 10.6* 05/24/2013   HCT 32.4* 05/24/2013   MCV 64.0* 05/24/2013   PLT 213 05/24/2013   NEUTROABS 2.1 05/24/2013      Chemistry      Component Value Date/Time   NA 141 03/29/2013 1116   NA 140 08/20/2012 1542   K 3.9 03/29/2013 1116   K 3.8 08/20/2012 1542   CL 102 08/20/2012 1542   CO2 26 03/29/2013 1116   CO2 26 08/20/2012 1542   BUN 13.0 03/29/2013 1116   BUN 11 08/20/2012 1542   CREATININE 0.8 03/29/2013 1116   CREATININE 0.80 08/20/2012 1542  Component Value Date/Time   CALCIUM 8.9 03/29/2013 1116   CALCIUM 9.9 08/20/2012 1542   ALKPHOS 50 03/29/2013 1116   ALKPHOS 55 08/20/2012 1542   AST 12 03/29/2013 1116   AST 17 08/20/2012 1542   ALT 13 03/29/2013 1116   ALT 14 08/20/2012 1542   BILITOT 0.87 03/29/2013 1116   BILITOT 0.9 08/20/2012 1542     CBC:  Recent Labs Lab 05/24/13 0915  WBC 4.5  NEUTROABS 2.1   HGB 10.6*  HCT 32.4*  MCV 64.0*  PLT 213    Anemia work up No results found for this basename: VITAMINB12, FOLATE, FERRITIN, TIBC, IRON, RETICCTPCT,  in the last 72 hours  Studies:  No results found.   RADIOGRAPHIC STUDIES: No results found.  ASSESSMENT: Beverly Campbell 48 y.o. female with a history of IDA (iron deficiency anemia)   PLAN:   1. IDA, moderate.  --We reviewed her labs consistent with moderate IDA. We will facilitate receipt of intravenous feraheme given her persistence in her microcytic, hypoproliferative anemia based on the results of her ferritin today (which is pending). She was instructed on the symptoms of anemia including Palpitations, chest pain or discomfort.  She was referred to Gynecology to determine if endometrial ablation is still warranted and was found to have uterine fibroids and is scheduled for vaginal ultrasound this month. Continue ferrous sulfate 325 mg bid as tolerated.   2. Follow up.  --We will repeat labs monthly including iron indices and have her return for follow up in 3 months.   All questions were answered. The patient knows to call the clinic with any problems, questions or concerns. We can certainly see the patient much sooner if necessary.  I spent 15 minutes counseling the patient face to face. The total time spent in the appointment was 25 minutes.    Concha Norway, MD 05/24/2013 9:51 AM  HPI

## 2013-05-24 NOTE — Patient Instructions (Signed)
Iron Deficiency Anemia, Adult Anemia is a condition in which there are less red blood cells or hemoglobin in the blood than normal. Hemoglobin is this part of red blood cells that carries oxygen. Iron deficiency anemia is anemia caused by too little iron. It is the most common type of anemia. It may leave you tired and short of breath. CAUSES   Lack of iron in the diet.  Poor absorption of iron, as seen with intestinal disorders.  Intestinal bleeding.  Heavy periods. SIGNS AND SYMPTOMS  Mild anemia may not be noticeable. Symptoms may include:  Fatigue.  Headache.  Pale skin.  Weakness.  Tiredness.  Shortness of breath.  Dizziness.  Cold hands and feet.  Fast or irregular heartbeat. DIAGNOSIS  Diagnosis requires a thorough evaluation and physical exam by your health care provider. Blood tests are generally used to confirm iron deficiency anemia. Additional tests may be done to find the underlying cause of your anemia. These may include:  Testing for blood in the stool (fecal occult blood test).  A procedure to see inside the colon and rectum (colonoscopy).  A procedure to see inside the esophagus and stomach (endoscopy). TREATMENT  Iron deficiency anemia is treated by correcting the cause of the deficiency. Treatment may involve:  Adding iron-rich foods to your diet.  Taking iron supplements. Pregnant or breastfeeding women need to take extra iron, because their normal diet usually does not provide the required amount.  Taking vitamins. Vitamin C improves the absorption of iron. Your health care provider may recommend taking your iron tablets with a glass of orange juice or vitamin C supplement.  Medicines to make heavy menstrual flow lighter.  Surgery. HOME CARE INSTRUCTIONS   Take iron as directed by your health care provider.  If you cannot tolerate taking iron supplements by mouth, talk to your health care provider about taking them through a vein  (intravenously) or an injection into a muscle.  For the best iron absorption, iron supplements should be taken on an empty stomach. If you cannot tolerate them on an empty stomach, you may need to take them with food.  Do not drink milk or take antacids at the same time as your iron supplements. Milk and antacids may interfere with the absorption of iron.  Iron supplements can cause constipation. Make sure to include fiber in your diet to prevent constipation. A stool softener may also be recommended.  Take vitamins as directed by your health care provider.  Eat a diet rich in iron. Foods high in iron include liver, lean beef, whole-grain bread, eggs, dried fruit, and dark green, leafy vegetables. SEEK IMMEDIATE MEDICAL CARE IF:   You faint. If this happens, do not drive. Call your local emergency services (911 in [place] if no other help is available.  You have chest pain.  You feel nauseous or vomit.  You have severe or increased shortness of breath with activity.  You feel weak.  You have a rapid heartbeat.  You have unexplained sweating.  You become lightheaded when getting up from a chair or bed. MAKE SURE YOU:   Understand these instructions.  Will watch your condition.  Will get help right away if you are not doing well or get worse. Document Released: 01/05/2000 Document Revised: 10/28/2012 Document Reviewed: 09/14/2012 [facility] Patient Information 2014 [place]. Uterine Fibroid A uterine fibroid is a growth (tumor) that occurs in your uterus. This type of tumor is not cancerous and does not spread out of the uterus. You  can have one or many fibroids. Fibroids can vary in size, weight, and where they grow in the uterus. Some can become quite large. Most fibroids do not require medical treatment, but some can cause pain or heavy bleeding during and between periods. CAUSES  A fibroid is the result of a single uterine cell that keeps growing (unregulated), which is  different than most cells in the human body. Most cells have a control mechanism that keeps them from reproducing without control.  SIGNS AND SYMPTOMS   Bleeding.  Pelvic pain and pressure.  Bladder problems due to the size of the fibroid.  Infertility and miscarriages depending on the size and location of the fibroid. DIAGNOSIS  Uterine fibroids are diagnosed through a physical exam. Your health care provider may feel the lumpy tumors during a pelvic exam. Ultrasonography may be done to get information regarding size, location, and number of tumors.  TREATMENT   Your health care provider may recommend watchful waiting. This involves getting the fibroid checked by your health care provider to see if it grows or shrinks.   Hormone treatment or an intrauterine device (IUD) may be prescribed.   Surgery may be needed to remove the fibroids (myomectomy) or the uterus (hysterectomy). This depends on your situation. When fibroids interfere with fertility and a woman wants to become pregnant, a health care provider may recommend having the fibroids removed.  [place] care depends on how you were treated. In general:   Keep all follow-up appointments with your health care provider.   Only take over-the-counter or prescription medicines as directed by your health care provider. If you were prescribed a hormone treatment, take the hormone medicines exactly as directed. Do not take aspirin. It can cause bleeding.   Talk to your health care provider about taking iron pills.  If your periods are troublesome but not so heavy, lie down with your feet raised slightly above your heart. Place cold packs on your lower abdomen.   If your periods are heavy, write down the number of pads or tampons you use per month. Bring this information to your health care provider.   Include green vegetables in your diet.  SEEK IMMEDIATE MEDICAL CARE IF:  You have pelvic pain or cramps not  controlled with medicines.   You have a sudden increase in pelvic pain.   You have an increase in bleeding between and during periods.   You have excessive periods and soak tampons or pads in a half hour or less.  You feel lightheaded or have fainting episodes. Document Released: 01/05/2000 Document Revised: 10/28/2012 Document Reviewed: 08/06/2012 [facility] Patient Information 2014 [place], [place]. Menorrhagia Menorrhagia is the medical term for when your menstrual periods are heavy or last longer than usual. With menorrhagia, every period you have may cause enough blood loss and cramping that you are unable to maintain your usual activities. CAUSES  In some cases, the cause of heavy periods is unknown, but a number of conditions may cause menorrhagia. Common causes include:  A problem with the hormone-producing thyroid gland (hypothyroid).  Noncancerous growths in the uterus (polyps or fibroids).  An imbalance of the estrogen and progesterone hormones.  One of your ovaries not releasing an egg during one or more months.  Side effects of having an intrauterine device (IUD).  Side effects of some medicines, such as anti-inflammatory medicines or blood thinners.  A bleeding disorder that stops your blood from clotting normally. SIGNS AND SYMPTOMS  During a normal  period, bleeding lasts between 4 and 8 days. Signs that your periods are too heavy include:  You routinely have to change your pad or tampon every 1 or 2 hours because it is completely soaked.  You pass blood clots larger than 1 inch (2.5 cm) in size.  You have bleeding for more than 7 days.  You need to use pads and tampons at the same time because of heavy bleeding.  You need to wake up to change your pads or tampons during the night.  You have symptoms of anemia, such as tiredness, fatigue, or shortness of breath. DIAGNOSIS  Your health care provider will perform a physical exam and ask you questions about  your symptoms and menstrual history. Other tests may be ordered based on what the health care provider finds during the exam. These tests can include:  Blood tests To check if you are pregnant or have hormonal changes, a bleeding or thyroid disorder, low iron levels (anemia), or other problems.  Endometrial biopsy Your health care provider takes a sample of tissue from the inside of your uterus to be examined under a microscope.  Pelvic ultrasound This test uses sound waves to make a picture of your uterus, ovaries, and vagina. The pictures can show if you have fibroids or other growths.  Hysteroscopy For this test, your health care provider will use a small telescope to look inside your uterus. Based on the results of your initial tests, your health care provider may recommend further testing. TREATMENT  Treatment may not be needed. If it is needed, your health care provider may recommend treatment with one or more medicines first. If these do not reduce bleeding enough, a surgical treatment might be an option. The best treatment for you will depend on:   Whether you need to prevent pregnancy.  Your desire to have children in the future.  The cause and severity of your bleeding.  Your opinion and personal preference.  Medicines for menorrhagia may include:  Birth control methods that use hormones These include the pill, skin patch, vaginal ring, shots that you get every 3 months, hormonal IUD, and implant. These treatments reduce bleeding during your menstrual period.  Medicines that thicken blood and slow bleeding.  Medicines that reduce swelling, such as ibuprofen.  Medicines that contain a synthetic hormone called progestin.   Medicines that make the ovaries stop working for a short time.  You may need surgical treatment for menorrhagia if the medicines are unsuccessful. Treatment options include:  Dilation and curettage (D&C) In this procedure, your health care provider  opens (dilates) your cervix and then scrapes or suctions tissue from the lining of your uterus to reduce menstrual bleeding.  Operative hysteroscopy This procedure uses a tiny tube with a light (hysteroscope) to view your uterine cavity and can help in the surgical removal of a polyp that may be causing heavy periods.  Endometrial ablation Through various techniques, your health care provider permanently destroys the entire lining of your uterus (endometrium). After endometrial ablation, most women have little or no menstrual flow. Endometrial ablation reduces your ability to become pregnant.  Endometrial resection This surgical procedure uses an electrosurgical wire loop to remove the lining of the uterus. This procedure also reduces your ability to become pregnant.  Hysterectomy Surgical removal of the uterus and cervix is a permanent procedure that stops menstrual periods. Pregnancy is not possible after a hysterectomy. This procedure requires anesthesia and hospitalization. HOME CARE INSTRUCTIONS   Only take over-the-counter or  prescription medicines as directed by your health care provider. Take prescribed medicines exactly as directed. Do not change or switch medicines without consulting your health care provider.  Take any prescribed iron pills exactly as directed by your health care provider. Long-term heavy bleeding may result in low iron levels. Iron pills help replace the iron your body lost from heavy bleeding. Iron may cause constipation. If this becomes a problem, increase the bran, fruits, and roughage in your diet.  Do not take aspirin or medicines that contain aspirin 1 week before or during your menstrual period. Aspirin may make the bleeding worse.  If you need to change your sanitary pad or tampon more than once every 2 hours, stay in bed and rest as much as possible until the bleeding stops.  Eat well-balanced meals. Eat foods high in iron. Examples are leafy green vegetables,  meat, liver, eggs, and whole grain breads and cereals. Do not try to lose weight until the abnormal bleeding has stopped and your blood iron level is back to normal. SEEK MEDICAL CARE IF:   You soak through a pad or tampon every 1 or 2 hours, and this happens every time you have a period.  You need to use pads and tampons at the same time because you are bleeding so much.  You need to change your pad or tampon during the night.  You have a period that lasts for more than 8 days.  You pass clots bigger than 1 inch wide.  You have irregular periods that happen more or less often than once a month.  You feel dizzy or faint.  You feel very weak or tired.  You feel short of breath or feel your heart is beating too fast when you exercise.  You have nausea and vomiting or diarrhea while you are taking your medicine.  You have any problems that may be related to the medicine you are taking. SEEK IMMEDIATE MEDICAL CARE IF:   You soak through 4 or more pads or tampons in 2 hours.  You have any bleeding while you are pregnant. MAKE SURE YOU:   Understand these instructions.  Will watch your condition.  Will get help right away if you are not doing well or get worse. Document Released: 01/07/2005 Document Revised: 10/28/2012 Document Reviewed: 06/28/2012 [facility] Patient Information 2014 [place].

## 2013-05-24 NOTE — Telephone Encounter (Signed)
gve the pt her June,july and aug 2015 appt calendar along with the avs.

## 2013-06-24 ENCOUNTER — Other Ambulatory Visit: Payer: 59

## 2013-06-24 ENCOUNTER — Other Ambulatory Visit: Payer: Self-pay | Admitting: Medical Oncology

## 2013-06-25 ENCOUNTER — Telehealth: Payer: Self-pay | Admitting: Internal Medicine

## 2013-06-25 NOTE — Telephone Encounter (Signed)
LEFT VM ASKING PT TO CALL THE OFFICE TO R/S 6/4 MISSED LAB APPT PER POF 6/4.

## 2013-07-26 ENCOUNTER — Other Ambulatory Visit: Payer: 59

## 2013-08-19 ENCOUNTER — Telehealth: Payer: Self-pay | Admitting: Internal Medicine

## 2013-08-19 NOTE — Telephone Encounter (Signed)
, °

## 2013-08-24 ENCOUNTER — Ambulatory Visit: Payer: 59

## 2013-08-24 ENCOUNTER — Other Ambulatory Visit: Payer: 59

## 2013-09-09 ENCOUNTER — Other Ambulatory Visit: Payer: 59

## 2013-09-09 ENCOUNTER — Ambulatory Visit: Payer: 59

## 2013-12-30 ENCOUNTER — Ambulatory Visit: Payer: Self-pay | Admitting: Oncology

## 2014-01-21 ENCOUNTER — Ambulatory Visit: Payer: Self-pay | Admitting: Oncology

## 2014-07-12 ENCOUNTER — Other Ambulatory Visit: Payer: Self-pay | Admitting: Nurse Practitioner

## 2014-07-12 DIAGNOSIS — Z1231 Encounter for screening mammogram for malignant neoplasm of breast: Secondary | ICD-10-CM

## 2014-07-13 ENCOUNTER — Ambulatory Visit
Admission: RE | Admit: 2014-07-13 | Discharge: 2014-07-13 | Disposition: A | Discharge status: Home / Self Care | Payer: 59 | Source: Ambulatory Visit | Attending: Nurse Practitioner | Admitting: Nurse Practitioner

## 2014-07-13 DIAGNOSIS — Z1231 Encounter for screening mammogram for malignant neoplasm of breast: Secondary | ICD-10-CM

## 2014-08-18 ENCOUNTER — Telehealth: Payer: Self-pay | Admitting: Gastroenterology

## 2014-08-18 NOTE — Telephone Encounter (Signed)
Colonoscopy triage patient speaks good [place]

## 2014-08-23 ENCOUNTER — Telehealth: Payer: Self-pay | Admitting: Gastroenterology

## 2014-08-23 ENCOUNTER — Other Ambulatory Visit: Payer: Self-pay

## 2014-08-23 NOTE — Telephone Encounter (Signed)
Patient has Colonoscopy 10-21-2014 [facility] and has [facility] insurance

## 2014-08-23 NOTE — Telephone Encounter (Signed)
Gastroenterology Pre-Procedure Review  Request Date: 10-21-2014 Requesting Physician: PCP turner  PATIENT REVIEW QUESTIONS: The patient responded to the following health history questions as indicated:    1. Are you having any GI issues? no 2. Do you have a personal history of Polyps? no 3. Do you have a family history of Colon Cancer or Polyps? yes (patient thinks dad had colon cancer) 4. Diabetes Mellitus? no 5. Joint replacements in the past 12 months?no 6. Major health problems in the past 3 months?no 7. Any artificial heart valves, MVP, or defibrillator?no    MEDICATIONS & ALLERGIES:    Patient reports the following regarding taking any anticoagulation/antiplatelet therapy:   Plavix, Coumadin, Eliquis, Xarelto, Lovenox, Pradaxa, Brilinta, or Effient? no Aspirin? no  Patient confirms/reports the following medications:  Current Outpatient Prescriptions  Medication Sig Dispense Refill   ferrous sulfate 325 (65 FE) MG tablet Take 325 mg by mouth daily with breakfast.     levothyroxine (SYNTHROID, LEVOTHROID) 100 MCG tablet Take 100 mcg by mouth daily before breakfast.     meclizine (ANTIVERT) 25 MG tablet Take 25 mg by mouth 3 (three) times daily as needed for dizziness.     ondansetron (ZOFRAN) 8 MG tablet Take by mouth every 8 (eight) hours as needed for nausea or vomiting.     No current facility-administered medications for this visit.    Patient confirms/reports the following allergies:  No Known Allergies  No orders of the defined types were placed in this encounter.    AUTHORIZATION INFORMATION Primary Insurance: 1D#: Group #:  Secondary Insurance: 1D#: Group #:  SCHEDULE INFORMATION: Date: 10-21-2014 Time: Location:[facility]

## 2014-08-29 ENCOUNTER — Telehealth: Payer: Self-pay | Admitting: Gastroenterology

## 2014-08-29 NOTE — Telephone Encounter (Signed)
Authorization has been obtained for CPT: 45378--Colonoscopy. Authorization# [id number] [employer] online.

## 2014-09-15 ENCOUNTER — Encounter: Payer: Self-pay | Admitting: Obstetrics and Gynecology

## 2014-10-21 ENCOUNTER — Ambulatory Visit: Admit: 2014-10-21 | Payer: 59 | Admitting: Gastroenterology

## 2014-10-21 SURGERY — COLONOSCOPY WITH PROPOFOL
Anesthesia: Choice

## 2014-10-25 ENCOUNTER — Ambulatory Visit (INDEPENDENT_AMBULATORY_CARE_PROVIDER_SITE_OTHER): Payer: 59 | Admitting: Obstetrics and Gynecology

## 2014-10-25 ENCOUNTER — Encounter: Payer: Self-pay | Admitting: Obstetrics and Gynecology

## 2014-10-25 VITALS — BP 125/83 | HR 89 | Temp 98.9°F | Resp 16 | Ht 61.0 in | Wt 171.8 lb

## 2014-10-25 DIAGNOSIS — N939 Abnormal uterine and vaginal bleeding, unspecified: Secondary | ICD-10-CM | POA: Diagnosis not present

## 2014-10-25 DIAGNOSIS — N921 Excessive and frequent menstruation with irregular cycle: Secondary | ICD-10-CM

## 2014-10-25 DIAGNOSIS — N923 Ovulation bleeding: Secondary | ICD-10-CM

## 2014-10-25 LAB — POCT URINE PREGNANCY: Preg Test, Ur: NEGATIVE

## 2014-10-26 ENCOUNTER — Encounter: Payer: Self-pay | Admitting: Obstetrics and Gynecology

## 2014-10-26 DIAGNOSIS — E079 Disorder of thyroid, unspecified: Secondary | ICD-10-CM | POA: Insufficient documentation

## 2014-10-26 NOTE — Progress Notes (Signed)
GYNECOLOGY CLINIC PROGRESS NOTE  Subjective:     Beverly Campbell is an 49 y.o. P57 woman with h/o chronic anemia and hypothyroidism who presents for abnormal menses.  Referred from [facility].  Patient states that she has always had heavy menses since onset of menarche, however over the past year the bleeding has increased even more.  Menses are lasting 7-10 days. She uses approximately 10 pads per day in the first 3 days of her cycle. Clots are 3-4 cm in size. Dysmenorrhea:moderate, occurring first 1-2 days of flow.  Notes that last menstrual cycle she had 2 periods within the month.  The first period was her usual cycle, lasting 7-10 days, then ~ 1 eek later, she had a 2nd episode of bleeding that was light flow, but lasted 6 days.  Reports that this period was more painful than her previous periods, and had more unilateral pain (left sided). Cyclic symptoms include: irritability, moodiness, pelvic pain and hot flushes. Current contraception: none. History of infertility: no. History of abnormal Pap smear: no.  Menstrual History: OB History -  SVD x 3, full term, no complications.  Menarche age: 35 Patient's last menstrual period was 10/05/2014.    Past Medical History  Diagnosis Date  . Anemia   . Hypothyroidism    No past surgical history on file.  Family History  Problem Relation Age of Onset  . Colon cancer Father Deceased, age 62  . Hypertension Mother    Social History   Social History  . Marital Status: Divorced    Spouse Name: N/A  . Number of Children: N/A  . Years of Education: N/A   Occupational History  . Not on file.   Social History Main Topics  . Smoking status: Never Smoker   . Smokeless tobacco: Not on file  . Alcohol Use: Yes  . Drug Use: No  . Sexual Activity: Not on file   Other Topics Concern  . Not on file   Social History Narrative    Current Outpatient Prescriptions on File Prior to Visit  Medication Sig Dispense Refill  .  ferrous sulfate 325 (65 FE) MG tablet Take 325 mg by mouth daily with breakfast.    . levothyroxine (SYNTHROID, LEVOTHROID) 100 MCG tablet Take 100 mcg by mouth daily before breakfast.    . meclizine (ANTIVERT) 25 MG tablet Take 25 mg by mouth 3 (three) times daily as needed for dizziness.    . ondansetron (ZOFRAN) 8 MG tablet Take by mouth every 8 (eight) hours as needed for nausea or vomiting.     No current facility-administered medications on file prior to visit.    No Known Allergies  Review of Systems Pertinent items noted in HPI and remainder of comprehensive ROS otherwise negative.    Objective:    BP 125/83 mmHg  Pulse 89  Temp(Src) 98.9 F (37.2 C) (Oral)  Resp 16  Ht 5\' 1"  (1.549 m)  Wt 171 lb 12.8 oz (77.928 kg)  BMI 32.48 kg/m2  LMP 10/05/2014  General:   alert and no distress  Skin:    normal and no rash or abnormalities  Abdomen:  soft, non-tender; bowel sounds normal; no masses,  no organomegaly  Pelvic:   VULVA: normal appearing vulva with no masses, tenderness or lesions, VAGINA: normal appearing vagina with normal color and discharge, no lesions. BLADDER:  Physical Exam notes no bladder neck or suprapubic tenderness.  Normal urethra.  CERVIX: normal appearing cervix without discharge or lesions, Nabothian cyst at  3 o'clock,  UTERUS: enlarged to 13 week's size, irregular, mobile,  ADNEXA: normal adnexa in size, nontender and no masses,  RECTAL: normal external sphincter, no lesions. Internal exam not performed. Rectovaginal septum normal  Extremities: Extremities: extremities normal, atraumatic, no cyanosis or edema  Neurologic: Neurologic: Grossly normal       Labs from [facility] ([facility] (10/05/14):  H/H - 10.3/32.6 LH 3.2  Pelvic Ultrasound (10/05/14):  Uterus 13.6 x 8.6 x 10.2 cm.  2 fibroids noted, 1) 5.5 x 5.2 x 5.7 cm, and 2) 3.9 x 4.1 x 2.6 cm.  Endometrial stripe thickness 7 mm.  Right ovary not identified.  Left ovary wnl, no masses.    Assessment:    Menometrorrhagia (possibly perimenopausal bleeding)  uterine fibroids   Vasomotor symptoms   Plan:   All questions answered. Blood tests: Free T4 level and TSH. Endometrial biopsy - see separate procedure note. Discussed management options for abnormal uterine bleeding including tranexamic acid (Lysteda), oral progesterone (Megace), Depo Provera,  endometrial ablation (Novasure/Hydrothermal Ablation) or hysterectomy as definitive surgical management.  Discussed risks and benefits of each method.   Patient is not a candidate for an IUD due to uterine size.  Patient desires endometrial ablation.  Informed patient that her uterine size is at the upper limits of normal for a successful ablation to be completed (specifically the Novasure, however Thermachoice might still be an option).  She also has a fibroid uterus, which, depending on the location of the fibroids could be contraindication to performing an ablation.  Will need to contact [facility] for further details of fibroid location (i.e. Fundal, cervical, etc) and nature (submucosal vs intramural vs subserosal). May be possible to perform Myosure also depending on location of fibroids. In addition, is also willing to consider Lupron as a means to aid with heavy menses and shrink fibroid uterus.  Declines definitive management with hysterectomy at this time. Printed patient education handouts were given to the patient to review at home. Bleeding precautions reviewed.  To continue iron therapy per hematology.  Follow up in 2 weeks for further discussion of management options.  Depending on which option patient chooses, will also need to address management of vasomotor symptoms.     Endometrial Biopsy Procedure Note  Pre-operative Diagnosis: Abnormal uterine bleeding (menometrorrhagia), perimenopausal, fibroid uterus  Post-operative Diagnosis: same  Indications: abnormal uterine bleeding, enlarged uterus  Procedure Details    Urine pregnancy test was done today and result was negative.  The risks (including infection, bleeding, pain, and uterine perforation) and benefits of the procedure were explained to the patient and Verbal informed consent was obtained.  Antibiotic prophylaxis against endocarditis was not indicated.   The patient was placed in the dorsal lithotomy position.  Bimanual exam showed the uterus to be in the anteroflexed position.  A Graves' speculum inserted in the vagina, and the cervix prepped with povidone iodine.  A sharp tenaculum was applied to the anterior lip of the cervix for stabilization.  A sterile uterine sound was used to sound the uterus to a depth of 13 cm.  A Pipelle endometrial aspirator was used to sample the endometrium.  Sample was sent for pathologic examination.  Condition: Stable  Complications: None  Plan: The patient was advised to call for any fever or for prolonged or severe pain or bleeding. She was advised to use OTC ibuprofen as needed for mild to moderate pain. She was advised to avoid vaginal intercourse for 48 hours or until the bleeding has  completely stopped.   Rubie Maid, MD [facility]

## 2014-10-27 LAB — PATHOLOGY

## 2014-11-08 ENCOUNTER — Encounter: Payer: Self-pay | Admitting: Obstetrics and Gynecology

## 2014-11-08 ENCOUNTER — Ambulatory Visit (INDEPENDENT_AMBULATORY_CARE_PROVIDER_SITE_OTHER): Payer: 59 | Admitting: Obstetrics and Gynecology

## 2014-11-08 VITALS — BP 126/82 | HR 82 | Resp 16 | Ht 62.0 in | Wt 172.2 lb

## 2014-11-08 DIAGNOSIS — N841 Polyp of cervix uteri: Secondary | ICD-10-CM | POA: Diagnosis not present

## 2014-11-08 DIAGNOSIS — D251 Intramural leiomyoma of uterus: Secondary | ICD-10-CM | POA: Diagnosis not present

## 2014-11-08 DIAGNOSIS — N924 Excessive bleeding in the premenopausal period: Secondary | ICD-10-CM

## 2014-11-08 NOTE — Progress Notes (Signed)
GYNECOLOGY PROGRESS NOTE  Subjective:    Patient ID: Beverly Campbell, female    DOB: 03-24-1965, 49 y.o.   MRN: [id number]  HPI  Patient is a 49 y.o. [id number] female who presents for f/u discussion of biopsy results and management options for abnormal perimenopausal bleeding and fibroid uterus.   The following portions of the patient's history were reviewed and updated as appropriate: allergies, current medications, past family history, past medical history, past social history, past surgical history and problem list.  Review of Systems A comprehensive review of systems was negative.   Objective:   Blood pressure 126/82, pulse 82, resp. rate 16, height 5\' 2"  (1.575 m), weight 172 lb 3.2 oz (78.109 kg), last menstrual period 10/05/2014. General appearance: alert and no distress Exam deferred.   Endometrial Biopsy (10/25/2014):  SECRETORY ENDOMETRIUM.  NO HYPERPLASIA OR CARCINOMA.  SMALL BENIGN ENDOCERVICAL POLYP.   Labs from [facility] (10/05/14):  H/H - 10.3/32.6 LH 3.2  Pelvic Ultrasound (10/05/14):  Uterus 13.6 x 8.6 x 10.2 cm.  2 fibroids noted, 1) 5.5 x 5.2 x 5.7 cm, and 2) 3.9 x 4.1 x 2.6 cm.  Endometrial stripe thickness 7 mm.  Right ovary not identified. Left ovary wnl, no masses.  Additional information obtained from radiologist (addendum report): Fibroids appear to be intramural, with the larger fibroid at fundal location, and smaller fibroid right lateral.   Assessment:   1. Fibroid uterus 2. Perimenopausal bleeding 3. Small endocervical polyp, benign  Plan:   Discussed management options for abnormal uterine bleeding including tranexamic acid (Lysteda), oral progesterone, Depo Provera, Mirena IUD, endometrial ablation (Hydrothermal Ablation;due to large uterine size and fibroids would not benefit from Novasure) or hysterectomy as definitive surgical management.  Discussed risks and benefits of each method.   Also within discussion was  management of fibroid uterus, would could be treated with the majority of treatment options noted above, with the addition of Uterine Artery Embolization and Lupron.  After lengthy discussion, patient now desires trial of Lupron to help shrink fibroids and decrease bleeding.  Printed patient education handouts were given to the patient to review at home.  Discussed side effects of medication and use for no longer than 6 months. Will order.   Endocervical polyp - discussion had on endocervical polyp and likely benign nature of polyp.  Patient notes understanding. To f/u once Lupron injection arrives. Scheduled for nurse visit.    A total of 15 minutes were spent face-to-face with the patient during this encounter and over half of that time dealt with counseling and coordination of care.   Rubie Maid, MD [facility]

## 2014-11-09 DIAGNOSIS — D251 Intramural leiomyoma of uterus: Secondary | ICD-10-CM | POA: Insufficient documentation

## 2014-11-09 DIAGNOSIS — N924 Excessive bleeding in the premenopausal period: Secondary | ICD-10-CM | POA: Insufficient documentation

## 2014-12-23 ENCOUNTER — Telehealth: Payer: Self-pay

## 2014-12-23 NOTE — Telephone Encounter (Signed)
Called this pt to inform her that her insurance will not pay for Lupron. Pt states that her insurance had already called her and informed her of this. Asked pt if she has thought about her other options that were previously discussed at last office visit. Pt states that she has not given it much thought, and would like more time to think about it specifically next year. Advised pt that it was at her discretion when she made follow up appt however advised pt of the risks of waiting. Also reiterated choices to pt (IUD, ablation, hysterectomy, oral progesterone, depo-provera).

## 2015-08-22 ENCOUNTER — Other Ambulatory Visit: Payer: Self-pay | Admitting: Nurse Practitioner

## 2015-08-22 DIAGNOSIS — Z1231 Encounter for screening mammogram for malignant neoplasm of breast: Secondary | ICD-10-CM

## 2015-09-06 ENCOUNTER — Ambulatory Visit: Payer: 59

## 2015-09-06 ENCOUNTER — Inpatient Hospital Stay: Payer: 59 | Admitting: Hematology and Oncology

## 2015-09-12 ENCOUNTER — Ambulatory Visit
Admission: RE | Admit: 2015-09-12 | Discharge: 2015-09-12 | Disposition: A | Discharge status: Home / Self Care | Payer: 59 | Source: Ambulatory Visit | Attending: Nurse Practitioner | Admitting: Nurse Practitioner

## 2015-09-12 ENCOUNTER — Other Ambulatory Visit: Payer: Self-pay | Admitting: Nurse Practitioner

## 2015-09-12 DIAGNOSIS — Z1231 Encounter for screening mammogram for malignant neoplasm of breast: Secondary | ICD-10-CM

## 2015-09-13 ENCOUNTER — Inpatient Hospital Stay: Payer: 59 | Attending: Hematology and Oncology | Admitting: Hematology and Oncology

## 2015-09-13 ENCOUNTER — Other Ambulatory Visit: Payer: Self-pay | Admitting: Hematology and Oncology

## 2015-09-13 ENCOUNTER — Encounter: Payer: Self-pay | Admitting: Hematology and Oncology

## 2015-09-13 VITALS — BP 111/76 | HR 66 | Temp 98.7°F | Resp 18 | Ht 61.0 in | Wt 161.5 lb

## 2015-09-13 DIAGNOSIS — E039 Hypothyroidism, unspecified: Secondary | ICD-10-CM | POA: Diagnosis not present

## 2015-09-13 DIAGNOSIS — D509 Iron deficiency anemia, unspecified: Secondary | ICD-10-CM | POA: Diagnosis not present

## 2015-09-13 DIAGNOSIS — N920 Excessive and frequent menstruation with regular cycle: Secondary | ICD-10-CM

## 2015-09-13 DIAGNOSIS — Z79899 Other long term (current) drug therapy: Secondary | ICD-10-CM | POA: Diagnosis not present

## 2015-09-13 DIAGNOSIS — D569 Thalassemia, unspecified: Secondary | ICD-10-CM | POA: Diagnosis not present

## 2015-09-13 DIAGNOSIS — D649 Anemia, unspecified: Secondary | ICD-10-CM

## 2015-09-13 DIAGNOSIS — N924 Excessive bleeding in the premenopausal period: Secondary | ICD-10-CM

## 2015-09-13 NOTE — Progress Notes (Addendum)
[facility] day:  09/13/15  Chief Complaint: Beverly Campbell is a 50 y.o. female with anemia who is referred in consultation by Leretha Pol, NP for assessment and management.  HPI:  The patient notes anemia her "whole life".  She moved to the [place] from [place] in 1980.  She believes that she was anemic as a teenager when she started having menses.  Records dating back to 06/03/2006 revealed persistent microcytic anemia.  MCV has ranged from 55. to 64.  Ferritin has ranged to 22 to 273.  Iron saturation has ranged from 14% to 55%.  Labs on 06/03/2006 noted a normal CMP, B12, folate, and SPEP.  The patient has a history of iron deficiency anemia secondary to heavy menses.  She was initially seen by Dr. Concha Norway on 03/31/2013.  Labs on 03/29/2013 included a hematocrit of 31.8, hemoglobin 10.4, and MCV 61.4.  Ferritin was 22.  She received Feraheme 1020 mg on 04/05/2013.  Hematocrit was 32.3 with a ferritin of 273 on 04/26/2013.  On 05/24/2013, hematocrit was 32.4 with a ferritin of 108.  She has seen gynecology.  Ultrasound was consistent with fibroids.  She initially declined endometrial ablation.  Menses last 7-10 days with heavy flow (7-10 pads/day plus tampons for the first 3 days).  She described passing clots 3-4 cm in size.  Last menses was only heavy x 2 days.  She did not use tampons, but 20 pads the first day.  She describes "gushing blood" on standing up.  She was seen by Dr. Rubie Maid, gynecologist, on 10/25/2014.  Ultrasound on 10/05/2014 revealed 2 fibroids (5.5 x 5.2 x 5.7 cm and 3.9 x 4.1 x 2.6 cm).  She underwent endometrial biopsy on 10/25/2014.  Biopsy was benign.  Patient decided on a trial of Lupron after follow-up discussions with Dr. Marcelline Mates on 11/08/2014.  However, note from 12/23/2014 revealed insurance denial of Lupron.  Other options were discussed (IUD, ablation, hysterectomy, oral progesterone,  Depo-Provera).  She has not had a colonoscopy as she just recently turned 21.  She denies any melena or hematochezia.  She denies any hematuria.  Diet is "healthy".  She eats a lot of chicken and fish.  She eats little red meat.  She eats green leafy vegetables.  She eats ice (pica).  She takes a MVI with iron (15 mg).  It is a natural preparation called [employer] with opc-3, MVI, activated B complex, and calcium plus.  She notes nausea with oral iron.  Labs on 08/24/2015 at [facility] revealed a hematocrit of 30.2, hemoglobin 9.3, MCV 58, platelets 257,000, and WBC 3800.  CMP revealed a creatinine of 0.67, calcium 8.9, protein 6.1, albumen 3.9, and normal LFTs.  Ferritin was 8 (low),  Iron studies revealed a saturation of 8% (low) and TIBC 365 (normal).  TSH was 1.050 with a free T4 of 1.74.  B12 was 642 with a folate of > 20.  Symptomatically, she feels "pretty good".  Energy level is good.  She denies any shortness of breath unless she does strenuous exercise.  She denies any family history of anemia or thalassemia.  Bilateral mammogram on 09/12/2015 revealed no evidence of malignancy.   Past Medical History:  Diagnosis Date  . Anemia   . Hypothyroidism   . Thyroid disease     Past Surgical History:  Procedure Laterality Date  . THYROID SURGERY  1997   had thyroid removed  . [place]  Family History  Problem Relation Age of Onset  . Colon cancer Father   . Hypertension Mother   . Diabetes Mother     Social History:  reports that she has never smoked. She does not have any smokeless tobacco history on file. She reports that she drinks alcohol. She reports that she does not use drugs.  The patient is from [place].  She came to the [place] in 1980.  She lives in [place].  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  She has 3 children (ages 71, 68, 28) who are alive and well.  No family history of anemia or thalassesmia.  The patient is alone  today.  Allergies: No Known Allergies  Current Medications: Current Outpatient Prescriptions  Medication Sig Dispense Refill  . levothyroxine (SYNTHROID, LEVOTHROID) 100 MCG tablet Take 100 mcg by mouth daily before breakfast.    . Multiple Vitamins-Iron (MULTIVITAMIN/IRON PO) Take by mouth.    . ferrous sulfate 325 (65 FE) MG tablet Take 325 mg by mouth daily with breakfast.    . meclizine (ANTIVERT) 25 MG tablet Take 25 mg by mouth 3 (three) times daily as needed for dizziness.    . ondansetron (ZOFRAN) 8 MG tablet Take by mouth every 8 (eight) hours as needed for nausea or vomiting.     No current facility-administered medications for this visit.     Review of Systems:  GENERAL:  Feels "pretty good".  Active.  No fevers, sweats or weight loss. PERFORMANCE STATUS (ECOG):  0 HEENT:  No visual changes, runny nose, sore throat, mouth sores or tenderness.  No vision check in 2 years. Lungs: Shortness of breath with strenuous exercise".  No cough.  No hemoptysis. Cardiac:  No chest pain, palpitations, orthopnea, or PND. GI:  No nausea, vomiting, diarrhea, constipation, melena or hematochezia.  No prior colonoscopy. GU:  No urgency, frequency, dysuria, or hematuria. Musculoskeletal:  No back pain.  No joint pain.  No muscle tenderness. Extremities:  No pain or swelling. Skin:  No rashes or skin changes. Neuro:  Rare headache.  No numbness or weakness, balance or coordination issues. Endocrine:  No diabetes, thyroid issues, hot flashes or night sweats. Psych:  No mood changes, depression or anxiety. Pain:  No focal pain. Review of systems:  All other systems reviewed and found to be negative.  Physical Exam: Blood pressure 111/76, pulse 66, temperature 98.7 F (37.1 C), temperature source Tympanic, resp. rate 18, height 5\' 1"  (1.549 m), weight 161 lb 7.8 oz (73.2 kg), last menstrual period 09/09/2015. GENERAL:  Well developed, well nourished, woman sitting comfortably in the exam room  in no acute distress. MENTAL STATUS:  Alert and oriented to person, place and time. HEAD:  Long brown hair.  Normocephalic, atraumatic, face symmetric, no Cushingoid features. EYES:  Glasses.  Brown eyes.  Pupils equal round and reactive to light and accomodation.  No conjunctivitis or scleral icterus. ENT:  Oropharynx clear without lesion.  Tongue normal. Mucous membranes moist.  RESPIRATORY:  Clear to auscultation without rales, wheezes or rhonchi. CARDIOVASCULAR:  Regular rate and rhythm without murmur, rub or gallop. ABDOMEN:  Soft, non-tender, with active bowel sounds, and no hepatosplenomegaly.  No masses. SKIN:  No rashes, ulcers or lesions. EXTREMITIES: No edema, no skin discoloration or tenderness.  No palpable cords. LYMPH NODES: No palpable cervical, supraclavicular, axillary or inguinal adenopathy  NEUROLOGICAL: Unremarkable. PSYCH:  Appropriate.   No visits with results within 3 Day(s) from this visit.  Latest known  visit with results is:  Office Visit on 10/25/2014  Component Date Value Ref Range Status  . Preg Test, Ur 10/25/2014 Negative  Negative Final  . PATH REPORT.SITE OF ORIGIN [facility] 10/27/2014 Comment   Final   Comment: Material submitted:                                        Marland Kitchen EMDOMETRIUM, BIOPSY   . Marland Kitchen 10/27/2014 Comment   Final   Comment: Clinician provided ICD-10: N93.9 N92.1   . PATH REPORT.FINAL DX [facility] 10/27/2014 Comment   Final   Comment:  Diagnosis: EMDOMETRIUM, BIOPSY: SECRETORY ENDOMETRIUM. NO HYPERPLASIA OR CARCINOMA. SMALL BENIGN ENDOCERVICAL POLYP. BUL/10/27/2014    . SIGNED OUT BY: 10/27/2014 Comment   Final   Comment: Electronically signed:                                     . Hoy Finlay, MD, Pathologist   . GROSS DESCRIPTION: 10/27/2014 Comment   Final   Comment: Johney Maine description:                                         . 1 Container, formalin-filled, labeled with patient identification. EMDOMETRIUM, BIOPSY: Received in  formalin is 1.5 X 3 X .3 cm in aggregate of tan and brown material.  Tissue is submitted in toto in 1 cassette(s). /SBB /SBB   . Marland Kitchen 10/27/2014 Comment   Final   Comment: Pathologist provided ICD-10: N84.1   . PAYMENT PROCEDURE 10/27/2014 Comment   Final   Comment: CPT                                                        . [id number]     Assessment:  Elysa Hornback is a 50 y.o. female from [place] with a long standing history of anemia.  She has a history of iron deficiency anemia secondary to menorrhagia.  She likely has underlying thalassemia as her RBC have remained microcytic despite replete iron stores in the past.  Oral iron causes diarrhea.  She has never had a colonoscopy (just turned 29).  She denies any melena or hematochezia.  Diet is good.  She has ice pica.  Labs on 08/24/2015 revealed a hematocrit of 30.2, hemoglobin 9.3, MCV 58, platelets 257,000, and WBC 3800.  Ferritin was 8 (low),  Iron saturation was 8% (low) and TIBC 365 (normal).  B12, folate, and TSH were normal.  She received Feraheme 1020 mg on 04/05/2013.  Ferritin improved from 22 to 273.  Hematocrit increased to 32.3 with MCV 63.4 (microcytic indices).  Symptomatically, she feels "pretty good".  Exam is unremarkable.  Plan: 1.  Discuss recent labs and diagnosis of iron deficiency.  Anemia is secondary to heavy menses.  She is currently following up with gynecology.  Multiple options are being discussed. 2.  Discuss IV versus oral ion.  Patient interested in IV iron secondary to GI issues with oral iron.  Discuss response to IV iron in the past.  Despite a normal ferritin, she remained  mildy anemic and microcytic.  The etiology is felt secondary to thalassemia.  We discussed checking a hemoglobinopathy panel. 3.  Preauth Feraheme. 4.  [facility] slip for:  CBC with diff, ferritin, hemoglobinopathy panel. 5.  RTC for Feraheme 510 mg IV. 6.  [facility] slip for: CBC with diff, ferritin in 5 weeks. 7.  Anticipate  colonoscopy this year. 8.  RTC in 5-6 weeks for MD assessment, review of labs, and +/- Feraheme.   Lequita Asal, MD  09/13/2015, 10:15 AM

## 2015-09-13 NOTE — Progress Notes (Signed)
Patient is here for new evaluation no complaints.

## 2015-09-15 ENCOUNTER — Encounter: Payer: Self-pay | Admitting: Hematology and Oncology

## 2015-09-15 DIAGNOSIS — D582 Other hemoglobinopathies: Secondary | ICD-10-CM | POA: Insufficient documentation

## 2015-09-20 ENCOUNTER — Inpatient Hospital Stay: Payer: 59

## 2015-09-20 VITALS — BP 108/74 | HR 68 | Temp 98.4°F | Resp 18

## 2015-09-20 DIAGNOSIS — D509 Iron deficiency anemia, unspecified: Secondary | ICD-10-CM | POA: Diagnosis not present

## 2015-09-20 MED ORDER — SODIUM CHLORIDE 0.9 % IV SOLN
Freq: Once | INTRAVENOUS | Status: AC
  Administered 2015-09-20: 10:00:00 via INTRAVENOUS
  Filled 2015-09-20: qty 1000

## 2015-09-20 MED ORDER — SODIUM CHLORIDE 0.9 % IV SOLN
510.0000 mg | Freq: Once | INTRAVENOUS | Status: AC
  Administered 2015-09-20: 510 mg via INTRAVENOUS
  Filled 2015-09-20: qty 17

## 2015-09-20 NOTE — Patient Instructions (Signed)

## 2015-10-12 ENCOUNTER — Encounter: Payer: Self-pay | Admitting: Hematology and Oncology

## 2015-10-18 ENCOUNTER — Encounter: Payer: Self-pay | Admitting: Hematology and Oncology

## 2015-10-18 ENCOUNTER — Inpatient Hospital Stay: Payer: 59 | Attending: Hematology and Oncology | Admitting: Hematology and Oncology

## 2015-10-18 VITALS — BP 110/78 | HR 67 | Wt 162.5 lb

## 2015-10-18 DIAGNOSIS — D509 Iron deficiency anemia, unspecified: Secondary | ICD-10-CM | POA: Diagnosis not present

## 2015-10-18 DIAGNOSIS — N920 Excessive and frequent menstruation with regular cycle: Secondary | ICD-10-CM | POA: Insufficient documentation

## 2015-10-18 DIAGNOSIS — R17 Unspecified jaundice: Secondary | ICD-10-CM

## 2015-10-18 DIAGNOSIS — D582 Other hemoglobinopathies: Secondary | ICD-10-CM | POA: Diagnosis not present

## 2015-10-18 DIAGNOSIS — Z79899 Other long term (current) drug therapy: Secondary | ICD-10-CM | POA: Insufficient documentation

## 2015-10-18 DIAGNOSIS — E039 Hypothyroidism, unspecified: Secondary | ICD-10-CM | POA: Insufficient documentation

## 2015-10-18 NOTE — Progress Notes (Signed)
Patient ambulates independently without assistance, brought to exam room 3.  Patient denies pain or discomfort at this time.  Vitals stable and documented

## 2015-10-18 NOTE — Progress Notes (Signed)
[facility] day:  10/18/15  Chief Complaint: Beverly Campbell is a 50 y.o. female with anemia who is seen for review of work-up and discussion regarding direction of therapy.  HPI:  The patient was last seen in the hematology clinic on 09/13/2015. At that time, she was seen for initial consultation.  She is from [place] with a long standing history of anemia.  She had a history of iron deficiency anemia secondary to menorrhagia.  She likely had an underlying thalassemia as her RBC had remained microcytic despite repletion of iron stores in the past.  Oral iron caused diarrhea.  CBC on 08/24/2015 revealed a hematocrit of 30.2, hemoglobin 11.9, and MCV 58.  Ferritin was 8 with an iron saturation of 8%.  She received Feraheme 510 mg on 09/20/2015.  Hemoglobin electrophoresis on 09/15/2015 revealed 1.2% Hgb F, 0% Hgb A, 0% Hgb S,Hgb C 0%, hemoglobin A2 5.2% (high) and hemoglobin variant 93.6% (high). 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  Symptomatically, she denies any symptoms. She is less tired. She plans to schedule her colonoscopy. She denies any melena or hematochezia.   Past Medical History:  Diagnosis Date  . Anemia   . Hypothyroidism   . Thyroid disease     Past Surgical History:  Procedure Laterality Date  . THYROID SURGERY  1997   had thyroid removed  . TUBAL LIGATION  1995    Family History  Problem Relation Age of Onset  . Colon cancer Father   . Hypertension Mother   . Diabetes Mother     Social History:  reports that she has never smoked. She does not have any smokeless tobacco history on file. She reports that she drinks alcohol. She reports that she does not use drugs.  The patient is from [place].  She came to the [place] in 1980.  She lives in [place].  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  She has 3 children (ages 24, 58, 47) who are alive and well.  No family  history of anemia or thalassesmia.  The patient is alone today.  Allergies: No Known Allergies  Current Medications: Current Outpatient Prescriptions  Medication Sig Dispense Refill  . levothyroxine (SYNTHROID, LEVOTHROID) 100 MCG tablet Take 100 mcg by mouth daily before breakfast.    . meclizine (ANTIVERT) 25 MG tablet Take 25 mg by mouth 3 (three) times daily as needed for dizziness.    . Multiple Vitamins-Iron (MULTIVITAMIN/IRON PO) Take by mouth.    . ondansetron (ZOFRAN) 8 MG tablet Take by mouth every 8 (eight) hours as needed for nausea or vomiting.    . ferrous sulfate 325 (65 FE) MG tablet Take 325 mg by mouth daily with breakfast.     No current facility-administered medications for this visit.     Review of Systems:  GENERAL:  Feels "good".  Less tired.  No fevers, sweats or weight loss. PERFORMANCE STATUS (ECOG):  0 HEENT:  No visual changes, runny nose, sore throat, mouth sores or tenderness.   Lungs:  No shortness of breath or cough.  No hemoptysis. Cardiac:  No chest pain, palpitations, orthopnea, or PND. GI:  No nausea, vomiting, diarrhea, constipation, melena or hematochezia.  Plans to schedule 1st colonoscopy. GU:  No urgency, frequency, dysuria, or hematuria. Musculoskeletal:  No back pain.  No joint pain.  No muscle tenderness. Extremities:  No pain or swelling. Skin:  No rashes or skin changes. Neuro:  Rare headache.  No numbness or weakness, balance or coordination issues. Endocrine:  No diabetes, thyroid issues, hot flashes or night sweats. Psych:  No mood changes, depression or anxiety. Pain:  No focal pain. Review of systems:  All other systems reviewed and found to be negative.  Physical Exam: Blood pressure 110/78, pulse 67, weight 162 lb 7.7 oz (73.7 kg). GENERAL:  Well developed, well nourished, woman sitting comfortably in the exam room in no acute distress. MENTAL STATUS:  Alert and oriented to person, place and time. HEAD:  Long brown hair.   Normocephalic, atraumatic, face symmetric, no Cushingoid features. EYES:  Glasses.  Brown eyes.  Pupils equal round and reactive to light and accomodation.  No conjunctivitis.  Faint scleral icterus. ENT:  Oropharynx clear without lesion.  Tongue normal. Mucous membranes moist.  RESPIRATORY:  Clear to auscultation without rales, wheezes or rhonchi. CARDIOVASCULAR:  Regular rate and rhythm without murmur, rub or gallop. ABDOMEN:  Soft, non-tender, with active bowel sounds, and no hepatosplenomegaly.  No masses. SKIN:  Tan.  No rashes, ulcers or lesions. EXTREMITIES: No edema, no skin discoloration or tenderness.  No palpable cords. LYMPH NODES: No palpable cervical, supraclavicular, axillary or inguinal adenopathy  NEUROLOGICAL: Unremarkable. PSYCH:  Appropriate.   No visits with results within 3 Day(s) from this visit.  Latest known visit with results is:  Office Visit on 10/25/2014  Component Date Value Ref Range Status  . Preg Test, Ur 10/25/2014 Negative  Negative Final  . PATH REPORT.SITE OF ORIGIN [facility] 10/27/2014 Comment   Final   Comment: Material submitted:                                        Marland Kitchen EMDOMETRIUM, BIOPSY   . Marland Kitchen 10/27/2014 Comment   Final   Comment: Clinician provided ICD-10: N93.9 N92.1   . PATH REPORT.FINAL DX [facility] 10/27/2014 Comment   Final   Comment:  Diagnosis: EMDOMETRIUM, BIOPSY: SECRETORY ENDOMETRIUM. NO HYPERPLASIA OR CARCINOMA. SMALL BENIGN ENDOCERVICAL POLYP. BUL/10/27/2014    . SIGNED OUT BY: 10/27/2014 Comment   Final   Comment: Electronically signed:                                     . Hoy Finlay, MD, Pathologist   . GROSS DESCRIPTION: 10/27/2014 Comment   Final   Comment: Johney Maine description:                                         . 1 Container, formalin-filled, labeled with patient identification. EMDOMETRIUM, BIOPSY: Received in formalin is 1.5 X 3 X .3 cm in aggregate of tan and brown material.  Tissue is submitted in toto in 1  cassette(s). /SBB /SBB   . Marland Kitchen 10/27/2014 Comment   Final   Comment: Pathologist provided ICD-10: N84.1   . PAYMENT PROCEDURE 10/27/2014 Comment   Final   Comment: CPT                                                        . [id number]  [facility] Labs: CBC on 08/24/2015 revealed a hematocrit of 30.2, hemoglobin 11.9, and MCV 58.  Ferritin was 8 with an iron saturation of 8% and a TIBC of 365. CBC on 10/12/2015 revealed a hematocrit of 33.5, hemoglobin 10.5, and MCV 60.  Ferritin was 98.   Assessment:  Geneva Cerny is a 50 y.o. female from [place] with a long standing history of anemia.  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  Oral iron causes diarrhea.  She has never had a colonoscopy (just turned 60).  She denies any melena or hematochezia.  Diet is good.  Ice pica has resolved.  Normal labs on 08/24/2015 included:  B12, folate, and TSH.  She received Feraheme 1020 mg on 04/05/2013.  Ferritin improved from 22 to 273.  Hematocrit increased to 32.3.  Microcytic indices persisted.  She received Feraheme 510 mg on 09/20/2015.  Ferritin improved from 8 to 98.  Symptomatically, she feels good.  Exam reveals faint scleral icterus.  Plan: 1.  Review [facility] labs.  Discuss hemoglobin E disease.  It is a rare form of thalassemia caused by a mutation in the beta globin chain.  RBC life span is shortened.  Anemia is usually mild.  RBCs are microcytic.  No intervention is needed. 2.  Discuss need to monitor ferritin as patient's RBCs will aways be microcytic.  When ferritin or iron saturations low +/- elevated TIBC, iron deficiency can be documented. 3.  Encourage follow-up colonoscopy. 4.  [facility] labs in 3 and 6 months 5.  RTC in 6 months for MD assess, review of [facility] labs and+/- Feraheme   Lequita Asal, MD  10/18/2015, 10:50 AM

## 2015-10-18 NOTE — Patient Instructions (Signed)
Hemoglobin Variants Hemoglobin is a substance in red blood cells that carries oxygen to the tissues of the body. A hemoglobin variant is an abnormal form of hemoglobin. Hemoglobin variants are caused by gene mutations. These mutations are passed down through families (inherited). DO HEMOGLOBIN VARIANTS AFFECT HEALTH? Different hemoglobin variants can affect a person's health in different ways. Hemoglobin variants can:  Cause a type of blood disorder called hemoglobinopathy.  Limit the ability of hemoglobin to carry oxygen.  Cause red blood cells to have an abnormal size or shape.  Cause red blood cells to break down too soon. The most common health problem caused by hemoglobin variants is hemolytic anemia. Anemia is a condition in which you do not have enough red blood cells to carry oxygen throughout your body. Hemolytic anemia occurs when your red blood cells are being destroyed faster than they are being produced.  WHAT ARE THE HEMOGLOBIN VARIANTS? There are six hemoglobin variants. The most common variants include:  Hemoglobin S. This variant causessickle cell disease. In sickle cell diseases, red-blood cells have an abnormal "sickle" shape. These sickle cells can clog small blood vessels. They can also cause hemolytic anemia.  Hemoglobin C. This variant causes hemolytic anemia.  Hemoglobin E. This variant causes red blood cells to be smaller than normal. It may also cause enlargement of the spleen and hemolytic anemia. This variant is more common in people from [place]. Less common hemoglobin variants include:  Hemoglobin F. This variant is common in infants but uncommon in children older than 2 years. Large amounts of hemoglobin F in the blood after age 26 can mean a child has a type of anemia called beta thalassemia major.  Hemoglobin H. This variant may be found in some cases of another type of childhood anemia (alpha thalassemia). This condition begins in early childhood. It  causes mild anemia and enlargement of the spleen and liver. It is more common among people from [place], [place], the [place], and the [place].  Hemoglobin Bart's. This variant causes a severe type of alpha thalassemia. HOW ARE HEMOGLOBIN VARIANTS IDENTIFIED? Hemoglobin variants can be identified with a set of blood tests called hemoglobinopathy evaluation. The tests require a blood sample from a vein in the hand or arm. Tests may include:  A hemoglobin solubility test. This test looks for hemoglobin S.  Hemoglobin electrophoresis. This test looks for many different hemoglobin variants.  Hemoglobin isoelectric focusing and high performance liquid chromatography.  Genetic blood tests. These are done to check for the mutations that cause hemoglobinopathy. WHY IS TESTING FOR HEMOGLOBIN VARIANTS DONE? A person may be tested for hemoglobin variants if he or she has:  Hemoglobinopathy or a family history of hemoglobinopathy.  Anemia.  An abnormal level of hemoglobin.  Signs or symptoms of hemolytic anemia. In the [place], laws in many states require newborns to be tested for many hemoglobin variants.   This information is not intended to replace advice given to you by your health care provider. Make sure you discuss any questions you have with your health care provider.   Document Released: 02/10/2004 Document Revised: 01/28/2014 Document Reviewed: 04/09/2013 Elsevier Interactive Patient Education [employer].

## 2015-11-24 ENCOUNTER — Telehealth: Payer: Self-pay | Admitting: Gastroenterology

## 2015-11-24 NOTE — Telephone Encounter (Signed)
Left voice message for patient to call and schedule appointment for Consult colonoscopy and anemia with GI

## 2015-11-27 ENCOUNTER — Encounter: Payer: Self-pay | Admitting: Nurse Practitioner

## 2015-11-27 ENCOUNTER — Encounter: Payer: Self-pay | Admitting: Internal Medicine

## 2015-12-05 ENCOUNTER — Encounter: Payer: 59 | Admitting: Obstetrics and Gynecology

## 2015-12-19 ENCOUNTER — Ambulatory Visit (INDEPENDENT_AMBULATORY_CARE_PROVIDER_SITE_OTHER): Payer: 59 | Admitting: Gastroenterology

## 2015-12-19 ENCOUNTER — Other Ambulatory Visit: Payer: Self-pay

## 2015-12-19 ENCOUNTER — Encounter: Payer: Self-pay | Admitting: Gastroenterology

## 2015-12-19 VITALS — BP 116/74 | HR 80 | Temp 98.7°F | Ht 61.0 in | Wt 167.0 lb

## 2015-12-19 DIAGNOSIS — D500 Iron deficiency anemia secondary to blood loss (chronic): Secondary | ICD-10-CM | POA: Diagnosis not present

## 2015-12-19 MED ORDER — NA SULFATE-K SULFATE-MG SULF 17.5-3.13-1.6 GM/177ML PO SOLN: 1.0000 | ORAL | 0 refills | Status: DC

## 2015-12-19 NOTE — Progress Notes (Signed)
Gastroenterology Consultation  Referring Provider:     Ronnell Freshwater, NP Primary Care Physician:  Ronnell Freshwater, NP Primary Gastroenterologist:  Dr. Jonathon Bellows  Reason for Consultation:     Anemia         HPI:   Beverly Campbell is a 50 y.o. y/o female referred for consultation & management  by Dr. Ronnell Freshwater, NP.  She has a history of iron deficiency anemia felt secondary to menorrhagia .She also has underlying thalassemia. She follows with Hematology Dr Mike Gip. She has never had a colonoscopy . She is on iron rep;lacement therapy.  Per Dr Drenda Freeze last note CBC on 08/24/2015 revealed a hematocrit of 30.2, hemoglobin 11.9, and MCV 58.  Ferritin was 8 with an iron saturation of 8%.  She says that she has had anemia all her life. She says that it got worse a while back. Denies any hematemesis, blood in stool. No blood in the urine. She has heavy perios, lasts 5-7 days, changes a "heavy pad every hour" . She says she has had a colonoscopy 15 years back again for anemia. Her father passed from colon cancer at age 34. No change in bowel habits. Not on any blood thinners.     Past Medical History:  Diagnosis Date  . Anemia   . Hypothyroidism   . Thyroid disease     Past Surgical History:  Procedure Laterality Date  . THYROID SURGERY  1997   had thyroid removed  . TUBAL LIGATION  1995    Prior to Admission medications   Medication Sig Start Date End Date Taking? Authorizing Provider  levothyroxine (SYNTHROID, LEVOTHROID) 100 MCG tablet Take 100 mcg by mouth daily before breakfast.   Yes Historical Provider, MD  ferrous sulfate 325 (65 FE) MG tablet Take 325 mg by mouth daily with breakfast.    Historical Provider, MD  meclizine (ANTIVERT) 25 MG tablet Take 25 mg by mouth 3 (three) times daily as needed for dizziness.    Historical Provider, MD  Multiple Vitamins-Iron (MULTIVITAMIN/IRON PO) Take by mouth.    Historical Provider, MD  ondansetron (ZOFRAN) 8  MG tablet Take by mouth every 8 (eight) hours as needed for nausea or vomiting.    Historical Provider, MD    Family History  Problem Relation Age of Onset  . Colon cancer Father   . Hypertension Mother   . Diabetes Mother      Social History  Substance Use Topics  . Smoking status: Never Smoker  . Smokeless tobacco: Never Used  . Alcohol use Yes    Allergies as of 12/19/2015  . (No Known Allergies)    Review of Systems:    All systems reviewed and negative except where noted in HPI.   Physical Exam:  BP 116/74   Pulse 80   Temp 98.7 F (37.1 C) (Oral)   Ht 5\' 1"  (1.549 m)   Wt 167 lb (75.8 kg)   BMI 31.55 kg/m  No LMP recorded. Psych:  Alert and cooperative. Normal mood and affect. General:   Alert,  Well-developed, well-nourished, pleasant and cooperative in NAD Head:  Normocephalic and atraumatic. Eyes:  Sclera clear, no icterus.   Conjunctiva pink. Ears:  Normal auditory acuity. Nose:  No deformity, discharge, or lesions. Mouth:  No deformity or lesions,oropharynx pink & moist. Neck:  Supple; no masses or thyromegaly. Lungs:  Respirations even and unlabored.  Clear throughout to auscultation.   No wheezes, crackles, or rhonchi. No acute distress. Heart:  Regular rate and rhythm; no murmurs, clicks, rubs, or gallops. Abdomen:  Normal bowel sounds.  No bruits.  Soft, non-tender and non-distended without masses, hepatosplenomegaly or hernias noted.  No guarding or rebound tenderness.    Msk:  Symmetrical without gross deformities. Good, equal movement & strength bilaterally. Pulses:  Normal pulses noted.   Imaging Studies: No results found.  Assessment and Plan:   Beverly Campbell is a 50 y.o. y/o female has been referred for anemia . She has a family history of colon cancer. She has not had a colonoscopy in 10 years.  She also has iron deficiency anemia. Most likely from menorrhagia.   Plan:  1. Plan for EGD+colonoscopy   I have discussed  alternative options, risks & benefits,  which include, but are not limited to, bleeding, infection, perforation,respiratory complication & drug reaction.  The patient agrees with this plan & written consent will be obtained.     Follow up in 3 months   Dr Jonathon Bellows MD

## 2015-12-20 ENCOUNTER — Ambulatory Visit (INDEPENDENT_AMBULATORY_CARE_PROVIDER_SITE_OTHER): Payer: 59 | Admitting: Obstetrics and Gynecology

## 2015-12-20 ENCOUNTER — Encounter: Payer: Self-pay | Admitting: Obstetrics and Gynecology

## 2015-12-20 VITALS — BP 110/72 | HR 82 | Ht 61.0 in | Wt 164.7 lb

## 2015-12-20 DIAGNOSIS — D250 Submucous leiomyoma of uterus: Secondary | ICD-10-CM | POA: Diagnosis not present

## 2015-12-20 DIAGNOSIS — D251 Intramural leiomyoma of uterus: Secondary | ICD-10-CM | POA: Diagnosis not present

## 2015-12-20 DIAGNOSIS — E039 Hypothyroidism, unspecified: Secondary | ICD-10-CM

## 2015-12-20 DIAGNOSIS — N924 Excessive bleeding in the premenopausal period: Secondary | ICD-10-CM

## 2015-12-20 NOTE — Patient Instructions (Signed)
Endometrial Ablation Endometrial ablation removes the lining of the uterus (endometrium). It is usually a same-day, outpatient treatment. Ablation helps avoid major surgery, such as surgery to remove the cervix and uterus (hysterectomy). After endometrial ablation, you will have little or no menstrual bleeding and may not be able to have children. However, if you are premenopausal, you will need to use a reliable method of birth control following the procedure because of the small chance that pregnancy can occur. There are different reasons to have this procedure. These reasons include:  Heavy periods.  Bleeding that is causing anemia.  Irregular bleeding.  Bleeding fibroids on the lining inside the uterus if they are smaller than 3 centimeters. This procedure may not be possible for you if:   You want to have children in the future.   You have severe cramps with your menstrual period.   You have precancerous or cancerous cells in your uterus.   You were recently pregnant.   You have gone through menopause.   You have had major surgery on your uterus, resulting in thinning of the uterine wall. Surgeries may include:  The removal of one or more uterine fibroids (myomectomy).  A cesarean section with a classic (vertical) incision on your uterus. Ask your health care provider what type of cesarean you had. Sometimes the scar on your skin is different than the scar on your uterus. Even if you have had surgery on your uterus, certain types of ablation may still be safe for you. Talk with your health care provider. LET YOUR HEALTH CARE PROVIDER KNOW ABOUT:  Any allergies you have.  All medicines you are taking, including vitamins, herbs, eye drops, creams, and over-the-counter medicines.  Previous problems you or members of your family have had with the use of anesthetics.  Any blood disorders you have.  Previous surgeries you have had.  Medical conditions you have. RISKS AND  COMPLICATIONS  Generally, this is a safe procedure. However, as with any procedure, complications can occur. Possible complications include:  Perforation of the uterus.  Bleeding.  Infection of the uterus, bladder, or vagina.  Injury to surrounding organs.  An air bubble to the lung (air embolus).  Pregnancy following the procedure.  Failure of the procedure to help the problem, requiring hysterectomy.  Decreased ability to diagnose cancer in the lining of the uterus. BEFORE THE PROCEDURE  The lining of the uterus must be tested to make sure there is no pre-cancerous or cancer cells present.  An ultrasound may be performed to look at the size of the uterus and to check for abnormalities.  Medicines may be given to thin the lining of the uterus. PROCEDURE  During the procedure, your health care provider will use a tool called a resectoscope to help see inside your uterus. There are different ways to remove the lining of your uterus.   Radiofrequency - This method uses a radiofrequency-alternating electric current to remove the lining of the uterus.  Cryotherapy - This method uses extreme cold to freeze the lining of the uterus.  Heated-Free Liquid - This method uses heated salt (saline) solution to remove the lining of the uterus.  Microwave - This method uses high-energy microwaves to heat up the lining of the uterus to remove it.  Thermal balloon - This method involves inserting a catheter with a balloon tip into the uterus. The balloon tip is filled with heated fluid to remove the lining of the uterus. AFTER THE PROCEDURE  After your procedure, do   not have sexual intercourse or insert anything into your vagina until permitted by your health care provider. After the procedure, you may experience:  Cramps.  Vaginal discharge.  Frequent urination. This information is not intended to replace advice given to you by your health care provider. Make sure you discuss any  questions you have with your health care provider. Document Released: 11/17/2003 Document Revised: 09/28/2014 Document Reviewed: 06/10/2012 Elsevier Interactive Patient Education  2017 Elsevier Inc.  

## 2015-12-20 NOTE — Progress Notes (Signed)
GYNECOLOGY PROGRESS NOTE  Subjective:    Patient ID: Beverly Campbell, female    DOB: Mar 19, 1965, 50 y.o.   MRN: [id number]  HPI  Patient is a 50 y.o. [id number] female with a h/o abnormal uterine bleeding and fibroid uterus who presents for consultation for possible surgical management. Considering endometrial ablation.  Of note, patient was seen last year for similar complaint, however decided to postpone intervention as her symptoms spontaneously improved.   However over the past 6 months, her symptoms have returned.   Menses are lasting 7-10 days. She uses approximately 8-10 pads per day in the first 3 days of her cycle. Clots are 3-4 cm in size. Dysmenorrhea:moderate, occurring first 1-2 days of flow.  Is also still having occasional intermenstrual bleeding.    Past Medical History:  Diagnosis Date  . Anemia   . Hypothyroidism   . Thyroid disease     Family History  Problem Relation Age of Onset  . Colon cancer Father   . Hypertension Mother   . Diabetes Mother     Past Surgical History:  Procedure Laterality Date  . THYROID SURGERY  1997   had thyroid removed  . TUBAL LIGATION  1995    Social History  Substance Use Topics  . Smoking status: Never Smoker  . Smokeless tobacco: Never Used  . Alcohol use Yes    Current Outpatient Prescriptions on File Prior to Visit  Medication Sig Dispense Refill  . levothyroxine (SYNTHROID, LEVOTHROID) 100 MCG tablet Take 100 mcg by mouth daily before breakfast.    . Multiple Vitamins-Iron (MULTIVITAMIN/IRON PO) Take by mouth.     No current facility-administered medications on file prior to visit.     No Known Allergies   Review of Systems Pertinent items noted in HPI and remainder of comprehensive ROS otherwise negative.   Objective:   Blood pressure 110/72, pulse 82, height 5\' 1"  (1.549 m), weight 164 lb 11.2 oz (74.7 kg), last menstrual period 12/09/2015. General appearance: alert and no distress Abdomen:  soft, non-tender; bowel sounds normal; no masses,  no organomegaly Pelvic: external genitalia normal, rectovaginal septum normal.  Vagina without discharge.  Cervix normal appearing, no lesions and no motion tenderness.  Uterus mobile, nontender, normal shape and size.  Adnexae non-palpable, nontender bilaterally.  Extremities: extremities normal, atraumatic, no cyanosis or edema Neurologic: Grossly normal   Labs (from outside office, [facility]):  10/12/15: 3.7>10.5/33.5<188   Imaging (Pelvic Ultrasound) 09/06/2015:   Finding:  Uterus is enlarged at 12.7 x 8.1 x 11.8 cm. Heterogeneous in echotexture with multiple solid masses consistent with fibroid formation noted' largest of which is 3.5 x 3.7 x 3.9 cm, possibly all intramural in location but with the potential submucosal location not excluded.   Endometrium: Not abnormally thickened; No abnormal fluid collection Ovaries unremarkable in size and echotexture. Normal adnexa. Call-de-sac: No significant free fluid visible.  Impression: Enlarged leiomyomatous uterus   Pathology:  Endometrial Biopsy (10/25/2014):  SECRETORY ENDOMETRIUM.  NO HYPERPLASIA OR CARCINOMA.  SMALL BENIGN ENDOCERVICAL POLYP.   Assessment:   Abnormal uterine bleeding Fibroid uterus H/o hypothyroidism  Plan:   1. Abnormal uterine bleeding -  Bleeding is likely due to perimenopausal status, in addition to fibroid uterus. Discussed management options for abnormal uterine bleeding including tranexamic acid (Lysteda), oral progesterone, Depo Provera, Mirena IUD, endometrial ablation, or hysterectomy as definitive surgical management.  Patient attempted to try Lupron last year but was denied by insurance.  Discussed risks and benefits of each method.  patient would like to have endometrial ablation, however is concerned as to whether her insurance will cover this or not. Will have patient discuss with the financial billing department to see if surgery is a  viable option for her. Otherwise patient may be willing to consider Depo-Provera. Prior to any interventions however, inform patient of the need to repeat her endometrial biopsy as it has been over a year.  2. Fibroid uterus - Appears that the uterus and fibroids have slightly decreased in size from prior ultrasound last year. Fibroids with intramural and possible submucosal component. If endometrial ablation in possible for patient, will likely also need a hysteroscopic fibroid resection using the Myosure.  3. History of hypothyroidism - Patient notes that she has had her thyroid levels checked within the past months, and were normal. Not likely a cause of abnormal uterine bleeding if normal.   Patient to schedule follow-up appointment for preop if approved for surgical intervention by her insurance. Otherwise patient can schedule an appointment for Depo-Provera and the medication will be called into her pharmacy.  Perform endometrial biopsy at time of next appointment.   A total of 25 minutes were spent face-to-face with the patient during this encounter and over half of that time involved counseling and coordination of care.  Rubie Maid, MD [facility]

## 2015-12-29 ENCOUNTER — Telehealth: Payer: Self-pay | Admitting: Gastroenterology

## 2015-12-29 ENCOUNTER — Encounter: Payer: Self-pay | Admitting: *Deleted

## 2015-12-29 NOTE — Telephone Encounter (Signed)
Please obtain prior authorization for patient with [facility] insurance. Patient is having and EGD and Colonoscopy at [facility] on 01/01/16. CPT: H7044205 and [id number].

## 2015-12-29 NOTE — Telephone Encounter (Signed)
The notification/prior authorization case information was transmitted on 12/29/2015 at 9:46 AM CST.  The notification/prior authorization reference number is [id number]  Received from [facility]

## 2016-01-01 ENCOUNTER — Encounter
Admission: RE | Disposition: A | Discharge status: Home / Self Care | Payer: Self-pay | Source: Ambulatory Visit | Attending: Gastroenterology

## 2016-01-01 ENCOUNTER — Ambulatory Visit: Payer: 59 | Admitting: Anesthesiology

## 2016-01-01 ENCOUNTER — Encounter: Payer: Self-pay | Admitting: Anesthesiology

## 2016-01-01 ENCOUNTER — Ambulatory Visit
Admission: RE | Admit: 2016-01-01 | Discharge: 2016-01-01 | Disposition: A | Discharge status: Home / Self Care | Payer: 59 | Source: Ambulatory Visit | Attending: Gastroenterology | Admitting: Gastroenterology

## 2016-01-01 DIAGNOSIS — K3189 Other diseases of stomach and duodenum: Secondary | ICD-10-CM | POA: Diagnosis not present

## 2016-01-01 DIAGNOSIS — K648 Other hemorrhoids: Secondary | ICD-10-CM

## 2016-01-01 DIAGNOSIS — E039 Hypothyroidism, unspecified: Secondary | ICD-10-CM | POA: Diagnosis not present

## 2016-01-01 DIAGNOSIS — K5730 Diverticulosis of large intestine without perforation or abscess without bleeding: Secondary | ICD-10-CM | POA: Diagnosis not present

## 2016-01-01 DIAGNOSIS — D508 Other iron deficiency anemias: Secondary | ICD-10-CM | POA: Diagnosis not present

## 2016-01-01 DIAGNOSIS — D509 Iron deficiency anemia, unspecified: Secondary | ICD-10-CM | POA: Insufficient documentation

## 2016-01-01 HISTORY — PX: ESOPHAGOGASTRODUODENOSCOPY (EGD) WITH PROPOFOL: SHX5813

## 2016-01-01 HISTORY — PX: COLONOSCOPY WITH PROPOFOL: SHX5780

## 2016-01-01 LAB — POCT PREGNANCY, URINE: Preg Test, Ur: NEGATIVE

## 2016-01-01 SURGERY — COLONOSCOPY WITH PROPOFOL
Anesthesia: General

## 2016-01-01 MED ORDER — SODIUM CHLORIDE 0.9 % IV SOLN
INTRAVENOUS | Status: DC
  Administered 2016-01-01: 1000 mL via INTRAVENOUS

## 2016-01-01 MED ORDER — LIDOCAINE 2% (20 MG/ML) 5 ML SYRINGE
INTRAMUSCULAR | Status: DC | PRN
  Administered 2016-01-01: 25 mg via INTRAVENOUS

## 2016-01-01 MED ORDER — PROPOFOL 500 MG/50ML IV EMUL
INTRAVENOUS | Status: DC | PRN
  Administered 2016-01-01: 120 ug/kg/min via INTRAVENOUS

## 2016-01-01 MED ORDER — SODIUM CHLORIDE 0.9 % IV SOLN
INTRAVENOUS | Status: DC
  Administered 2016-01-01: 11:00:00 via INTRAVENOUS

## 2016-01-01 MED ORDER — FENTANYL CITRATE (PF) 100 MCG/2ML IJ SOLN
INTRAMUSCULAR | Status: DC | PRN
  Administered 2016-01-01: 50 ug via INTRAVENOUS

## 2016-01-01 MED ORDER — MIDAZOLAM HCL 5 MG/5ML IJ SOLN
INTRAMUSCULAR | Status: DC | PRN
  Administered 2016-01-01: 1 mg via INTRAVENOUS

## 2016-01-01 MED ORDER — PROPOFOL 10 MG/ML IV BOLUS
INTRAVENOUS | Status: DC | PRN
  Administered 2016-01-01: 70 mg via INTRAVENOUS
  Administered 2016-01-01: 30 mg via INTRAVENOUS

## 2016-01-01 MED ORDER — GLYCOPYRROLATE 0.2 MG/ML IJ SOLN
INTRAMUSCULAR | Status: DC | PRN
  Administered 2016-01-01: 0.2 mg via INTRAVENOUS

## 2016-01-01 NOTE — H&P (Signed)
  Jonathon Bellows MD [address] [place], [place] [address] Phone: [phone] Fax : [phone]  Primary Care Physician:  Ronnell Freshwater, NP Primary Gastroenterologist:  Dr. Jonathon Bellows   Pre-Procedure History & Physical: HPI:  Beverly Campbell is a 50 y.o. female is here for an endoscopy and colonoscopy.   Past Medical History:  Diagnosis Date  . Anemia   . Hypothyroidism   . Thyroid disease     Past Surgical History:  Procedure Laterality Date  . THYROID SURGERY  1997   had thyroid removed  . TUBAL LIGATION  1995    Prior to Admission medications   Medication Sig Start Date End Date Taking? Authorizing Provider  levothyroxine (SYNTHROID, LEVOTHROID) 100 MCG tablet Take 100 mcg by mouth daily before breakfast.   Yes Historical Provider, MD  Multiple Vitamins-Iron (MULTIVITAMIN/IRON PO) Take by mouth.   Yes Historical Provider, MD    Allergies as of 12/19/2015  . (No Known Allergies)    Family History  Problem Relation Age of Onset  . Colon cancer Father   . Hypertension Mother   . Diabetes Mother     Social History   Social History  . Marital status: Divorced    Spouse name: N/A  . Number of children: N/A  . Years of education: N/A   Occupational History  . Not on file.   Social History Main Topics  . Smoking status: Never Smoker  . Smokeless tobacco: Never Used  . Alcohol use Yes  . Drug use: No  . Sexual activity: Yes    Birth control/ protection: None   Other Topics Concern  . Not on file   Social History Narrative  . No narrative on file    Review of Systems: See HPI, otherwise negative ROS  Physical Exam: BP 112/74   Pulse 78   Temp 98.4 F (36.9 C) (Tympanic)   Resp 16   Ht 5\' 1"  (1.549 m)   Wt 152 lb (68.9 kg)   LMP 12/09/2015 Comment: Urine Preg. Negative.  SpO2 99%   BMI 28.72 kg/m  General:   Alert,  pleasant and cooperative in NAD Head:  Normocephalic and atraumatic. Neck:  Supple; no masses or  thyromegaly. Lungs:  Clear throughout to auscultation.    Heart:  Regular rate and rhythm. Abdomen:  Soft, nontender and nondistended. Normal bowel sounds, without guarding, and without rebound.   Neurologic:  Alert and  oriented x4;  grossly normal neurologically.  Impression/Plan: Beverly Campbell is here for an endoscopy and colonoscopy to be performed for iron deficiency anemia  Risks, benefits, limitations, and alternatives regarding  endoscopy and colonoscopy have been reviewed with the patient.  Questions have been answered.  All parties agreeable.   Jonathon Bellows, MD  01/01/2016, 10:05 AM

## 2016-01-01 NOTE — Op Note (Signed)
[facility] Gastroenterology Patient Name: Beverly Campbell Procedure Date: 01/01/2016 10:31 AM MRN: [id number] Account #: [id number] Date of Birth: Mar 19, 1965 Admit Type: Outpatient Age: 50 Room: [facility] ENDO ROOM 1 Gender: Female Note Status: Finalized Procedure:            Colonoscopy Indications:          Iron deficiency anemia Providers:            Jonathon Bellows MD, MD Referring MD:         No Local Md, MD (Referring MD) Medicines:            Monitored Anesthesia Care Complications:        No immediate complications. Procedure:            Pre-Anesthesia Assessment:                       - Prior to the procedure, a History and Physical was                        performed, and patient medications, allergies and                        sensitivities were reviewed. The patient's tolerance of                        previous anesthesia was reviewed.                       - The risks and benefits of the procedure and the                        sedation options and risks were discussed with the                        patient. All questions were answered and informed                        consent was obtained.                       - The risks and benefits of the procedure and the                        sedation options and risks were discussed with the                        patient. All questions were answered and informed                        consent was obtained.                       - ASA Grade Assessment: III - A patient with severe                        systemic disease.                       After obtaining informed consent, the colonoscope was  passed under direct vision. Throughout the procedure,                        the patient's blood pressure, pulse, and oxygen                        saturations were monitored continuously. The                        Colonoscope was introduced through the anus and   advanced to the the cecum, identified by the                        appendiceal orifice, IC valve and transillumination.                        The colonoscopy was performed with ease. The patient                        tolerated the procedure well. The quality of the bowel                        preparation was excellent. Findings:      The perianal and digital rectal examinations were normal.      A few medium-mouthed diverticula were found in the entire colon.      Non-bleeding internal hemorrhoids were found during retroflexion. The       hemorrhoids were medium-sized.      The exam was otherwise without abnormality. Impression:           - Diverticulosis in the entire examined colon.                       - Non-bleeding internal hemorrhoids.                       - The examination was otherwise normal.                       - No specimens collected. Recommendation:       - Patient has a contact number available for                        emergencies. The signs and symptoms of potential                        delayed complications were discussed with the patient.                        Return to normal activities tomorrow. Written discharge                        instructions were provided to the patient.                       - Resume previous diet.                       - Continue present medications.                       - Repeat colonoscopy in 10 years for screening purposes.                       -  Return to my office as previously scheduled.                       - To visualize the small bowel, perform video capsule                        endoscopy in 2 weeks. Procedure Code(s):    --- Professional ---                       [phone], Colonoscopy, flexible; diagnostic, including                        collection of specimen(s) by brushing or washing, when                        performed (separate procedure) Diagnosis Code(s):    --- Professional ---                       K64.8,  Other hemorrhoids                       D50.9, Iron deficiency anemia, unspecified                       K57.30, Diverticulosis of large intestine without                        perforation or abscess without bleeding CPT copyright 2016 American Medical Association. All rights reserved. The codes documented in this report are preliminary and upon coder review may  be revised to meet current compliance requirements. Jonathon Bellows, MD Jonathon Bellows MD, MD 01/01/2016 11:00:25 AM This report has been signed electronically. Number of Addenda: 0 Note Initiated On: 01/01/2016 10:31 AM Scope Withdrawal Time: 0 hours 12 minutes 29 seconds  Total Procedure Duration: 0 hours 15 minutes 25 seconds       [facility]

## 2016-01-01 NOTE — Anesthesia Postprocedure Evaluation (Signed)
Anesthesia Post Note  Patient: Beverly Campbell  Procedure(s) Performed: Procedure(s) (LRB): COLONOSCOPY WITH PROPOFOL (N/A) ESOPHAGOGASTRODUODENOSCOPY (EGD) WITH PROPOFOL (N/A)  Patient location during evaluation: Endoscopy Anesthesia Type: General Level of consciousness: awake and alert Pain management: pain level controlled Vital Signs Assessment: post-procedure vital signs reviewed and stable Respiratory status: spontaneous breathing, nonlabored ventilation, respiratory function stable and patient connected to nasal cannula oxygen Cardiovascular status: blood pressure returned to baseline and stable Postop Assessment: no signs of nausea or vomiting Anesthetic complications: no    Last Vitals:  Vitals:   01/01/16 1123 01/01/16 1133  BP: 106/76 107/76  Pulse: 64 (!) 57  Resp: 16 14  Temp:      Last Pain:  Vitals:   01/01/16 1103  TempSrc: Tympanic                 Precious Haws Piscitello

## 2016-01-01 NOTE — Anesthesia Preprocedure Evaluation (Signed)
Anesthesia Evaluation  Patient identified by MRN, date of birth, ID band Patient awake    Reviewed: Allergy & Precautions, H&P , NPO status , Patient's Chart, lab work & pertinent test results  History of Anesthesia Complications Negative for: history of anesthetic complications  Airway Mallampati: III  TM Distance: <3 FB Neck ROM: full    Dental  (+) Teeth Intact   Pulmonary neg pulmonary ROS, neg shortness of breath,    Pulmonary exam normal breath sounds clear to auscultation       Cardiovascular Exercise Tolerance: Good (-) angina(-) Past MI and (-) DOE negative cardio ROS Normal cardiovascular exam Rhythm:regular Rate:Normal     Neuro/Psych negative neurological ROS  negative psych ROS   GI/Hepatic negative GI ROS, Neg liver ROS, neg GERD  ,  Endo/Other  Hypothyroidism   Renal/GU negative Renal ROS  negative genitourinary   Musculoskeletal   Abdominal   Peds  Hematology negative hematology ROS (+)   Anesthesia Other Findings Signs and symptoms suggestive of sleep apnea   Past Medical History: No date: Anemia No date: Hypothyroidism No date: Thyroid disease  Past Surgical History: 1997: THYROID SURGERY     Comment: had thyroid removed 1995: TUBAL LIGATION  BMI    Body Mass Index:  28.72 kg/m      Reproductive/Obstetrics negative OB ROS                             Anesthesia Physical Anesthesia Plan  ASA: III  Anesthesia Plan: General   Post-op Pain Management:    Induction:   Airway Management Planned:   Additional Equipment:   Intra-op Plan:   Post-operative Plan:   Informed Consent: I have reviewed the patients History and Physical, chart, labs and discussed the procedure including the risks, benefits and alternatives for the proposed anesthesia with the patient or authorized representative who has indicated his/her understanding and acceptance.    Dental Advisory Given  Plan Discussed with: Anesthesiologist, CRNA and Surgeon  Anesthesia Plan Comments:         Anesthesia Quick Evaluation

## 2016-01-01 NOTE — Op Note (Addendum)
[facility] Gastroenterology Patient Name: Beverly Campbell Procedure Date: 01/01/2016 10:32 AM MRN: [id number] Account #: [id number] Date of Birth: [birth year] Admit Type: Outpatient Age: 50 Room: [facility] ENDO ROOM 1 Gender: Female Note Status: Finalized Procedure:            Upper GI endoscopy Indications:          Iron deficiency anemia Providers:            Jonathon Bellows MD, MD Referring MD:         No Local Md, MD (Referring MD) Medicines:            Monitored Anesthesia Care Complications:        No immediate complications. Procedure:            Pre-Anesthesia Assessment:                       - Prior to the procedure, a History and Physical was                        performed, and patient medications, allergies and                        sensitivities were reviewed. The patient's tolerance of                        previous anesthesia was reviewed.                       - The risks and benefits of the procedure and the                        sedation options and risks were discussed with the                        patient. All questions were answered and informed                        consent was obtained.                       - The risks and benefits of the procedure and the                        sedation options and risks were discussed with the                        patient. All questions were answered and informed                        consent was obtained.                       - ASA Grade Assessment: III - A patient with severe                        systemic disease.                       After obtaining informed consent, the endoscope was  passed under direct vision. Throughout the procedure,                        the patient's blood pressure, pulse, and oxygen                        saturations were monitored continuously. The Endoscope                        was introduced through the mouth, and advanced to the                        third part of duodenum. The upper GI endoscopy was                        accomplished with ease. The patient tolerated the                        procedure well. Findings:      The stomach was normal.      The esophagus was normal.      The examined duodenum was normal. Biopsies for histology were taken with       a cold forceps for evaluation of celiac disease. Impression:           - Normal stomach.                       - Normal esophagus.                       - Normal examined duodenum. Biopsied. Recommendation:       - Patient has a contact number available for                        emergencies. The signs and symptoms of potential                        delayed complications were discussed with the patient.                        Return to normal activities tomorrow. Written discharge                        instructions were provided to the patient.                       - Resume previous diet.                       - Continue present medications.                       - Await pathology results.                       - Discharge patient to home (with escort).                       - Return to my office as previously scheduled. Jonathon Bellows, MD Jonathon Bellows MD, MD 01/01/2016 10:41:34 AM This report has been signed electronically. Number of Addenda: 0 Note Initiated On: 01/01/2016 10:32 AM  [facility]

## 2016-01-01 NOTE — Transfer of Care (Signed)
Immediate Anesthesia Transfer of Care Note  Patient: Beverly Campbell  Procedure(s) Performed: Procedure(s): COLONOSCOPY WITH PROPOFOL (N/A) ESOPHAGOGASTRODUODENOSCOPY (EGD) WITH PROPOFOL (N/A)  Patient Location: Endoscopy Unit  Anesthesia Type:General  Level of Consciousness: awake and alert   Airway & Oxygen Therapy: Patient Spontanous Breathing and Patient connected to nasal cannula oxygen  Post-op Assessment: Report given to RN and Post -op Vital signs reviewed and stable  Post vital signs: Reviewed  Last Vitals:  Vitals:   01/01/16 0951 01/01/16 1102  BP: 112/74 97/72  Pulse: 78 76  Resp: 16 16  Temp: 36.9 C 36.3 C    Last Pain:  Vitals:   01/01/16 1102  TempSrc: Tympanic         Complications: No apparent anesthesia complications

## 2016-01-02 ENCOUNTER — Encounter: Payer: Self-pay | Admitting: Gastroenterology

## 2016-01-02 LAB — SURGICAL PATHOLOGY

## 2016-01-17 ENCOUNTER — Telehealth: Payer: Self-pay

## 2016-01-17 NOTE — Telephone Encounter (Signed)
Pt notified of EGD/Colonoscopy results. Dr. Vicente Males wanted pt to be scheduled for a capsule study but pt stated she is doing okay and does not want to schedule at this time.

## 2016-01-17 NOTE — Telephone Encounter (Signed)
-----   Message from Jonathon Bellows, MD sent at 01/03/2016  8:58 AM EST ----- Normal duodenal bx

## 2016-04-17 ENCOUNTER — Inpatient Hospital Stay: Payer: 59

## 2016-04-17 ENCOUNTER — Encounter: Payer: Self-pay | Admitting: Hematology and Oncology

## 2016-04-17 ENCOUNTER — Inpatient Hospital Stay: Payer: 59 | Attending: Hematology and Oncology | Admitting: Hematology and Oncology

## 2016-04-17 VITALS — BP 120/81 | HR 52 | Resp 18

## 2016-04-17 VITALS — BP 120/78 | HR 76 | Temp 98.6°F | Resp 18 | Wt 168.0 lb

## 2016-04-17 DIAGNOSIS — N920 Excessive and frequent menstruation with regular cycle: Secondary | ICD-10-CM

## 2016-04-17 DIAGNOSIS — K648 Other hemorrhoids: Secondary | ICD-10-CM | POA: Insufficient documentation

## 2016-04-17 DIAGNOSIS — K5730 Diverticulosis of large intestine without perforation or abscess without bleeding: Secondary | ICD-10-CM | POA: Diagnosis not present

## 2016-04-17 DIAGNOSIS — D582 Other hemoglobinopathies: Secondary | ICD-10-CM

## 2016-04-17 DIAGNOSIS — D500 Iron deficiency anemia secondary to blood loss (chronic): Secondary | ICD-10-CM | POA: Insufficient documentation

## 2016-04-17 DIAGNOSIS — D509 Iron deficiency anemia, unspecified: Secondary | ICD-10-CM

## 2016-04-17 DIAGNOSIS — E039 Hypothyroidism, unspecified: Secondary | ICD-10-CM | POA: Insufficient documentation

## 2016-04-17 DIAGNOSIS — Z79899 Other long term (current) drug therapy: Secondary | ICD-10-CM | POA: Diagnosis not present

## 2016-04-17 LAB — CBC WITH DIFFERENTIAL/PLATELET
Basophils Absolute: 0 10*3/uL (ref 0–0.1)
Basophils Relative: 1 %
Eosinophils Absolute: 0.1 10*3/uL (ref 0–0.7)
Eosinophils Relative: 3 %
HCT: 26.3 % — ABNORMAL LOW (ref 35.0–47.0)
Hemoglobin: 7.9 g/dL — ABNORMAL LOW (ref 12.0–16.0)
Lymphocytes Relative: 50 %
Lymphs Abs: 1.9 10*3/uL (ref 1.0–3.6)
MCH: 15.2 pg — ABNORMAL LOW (ref 26.0–34.0)
MCHC: 29.9 g/dL — ABNORMAL LOW (ref 32.0–36.0)
MCV: 50.7 fL — ABNORMAL LOW (ref 80.0–100.0)
Monocytes Absolute: 0.3 10*3/uL (ref 0.2–0.9)
Monocytes Relative: 7 %
Neutro Abs: 1.5 10*3/uL (ref 1.4–6.5)
Neutrophils Relative %: 39 %
Platelets: 255 10*3/uL (ref 150–440)
RBC: 5.18 MIL/uL (ref 3.80–5.20)
RDW: 20.6 % — ABNORMAL HIGH (ref 11.5–14.5)
WBC: 3.8 10*3/uL (ref 3.6–11.0)

## 2016-04-17 MED ORDER — HEPARIN SOD (PORK) LOCK FLUSH 100 UNIT/ML IV SOLN: 500.0000 [IU] | Freq: Once | INTRAVENOUS | Status: DC | PRN

## 2016-04-17 MED ORDER — HEPARIN SOD (PORK) LOCK FLUSH 100 UNIT/ML IV SOLN: 250.0000 [IU] | Freq: Once | INTRAVENOUS | Status: DC | PRN

## 2016-04-17 MED ORDER — SODIUM CHLORIDE 0.9 % IV SOLN
Freq: Once | INTRAVENOUS | Status: AC
  Administered 2016-04-17: 14:00:00 via INTRAVENOUS
  Filled 2016-04-17: qty 1000

## 2016-04-17 MED ORDER — ALTEPLASE 2 MG IJ SOLR: 2.0000 mg | Freq: Once | INTRAMUSCULAR | Status: DC | PRN

## 2016-04-17 MED ORDER — SODIUM CHLORIDE 0.9 % IJ SOLN
3.0000 mL | Freq: Once | INTRAMUSCULAR | Status: DC | PRN
  Filled 2016-04-17: qty 10

## 2016-04-17 MED ORDER — FERUMOXYTOL INJECTION 510 MG/17 ML
510.0000 mg | Freq: Once | INTRAVENOUS | Status: AC
  Administered 2016-04-17: 510 mg via INTRAVENOUS
  Filled 2016-04-17: qty 17

## 2016-04-17 MED ORDER — SODIUM CHLORIDE 0.9 % IJ SOLN
10.0000 mL | INTRAMUSCULAR | Status: DC | PRN
  Filled 2016-04-17: qty 10

## 2016-04-17 NOTE — Patient Instructions (Signed)

## 2016-04-17 NOTE — Progress Notes (Signed)
[place] [facility] day:  04/17/16  Chief Complaint: Beverly Campbell is a 51 y.o. female with iron deficiency anemia who is seen for 6 month assessment.  HPI:  The patient was last seen in the hematology clinic on 10/18/2015. At that time,she felt good.  Exam revealed faint scleral icterus.  We discussed her hemoglobin E disease.  We discussed the plan for colonoscopy as she was turning 41.  She underwent EGD and colonoscopy on 01/01/2016 by Dr. Jonathon Bellows.  EGD was normal.  Duodenal biopsy was negative   Colonoscopy revealed diverticulosis in the entire colon and non-bleeding internal hemorrhoids.  She has had follow-up [facility] labs: CBC on 03/01/2016 revealed a hematocrit of 25.1, hemoglobin 7.7, MCV 55, platelets 310,000, white count 4800.  Ferritin was 6. Iron saturation was 5% with a TIBC of 386. Comprehensive metabolic panel was normal.  CBC on 04/17/2016 revealed a hematocrit of 27.6, hemoglobin 8, MCV 54, platelets 282,000, white count 4000 with an ANC of 1700. Ferritin was apparent saturation is a TIBC of 386.  She states that her diet is good.  She eats chicken and green leafy vegetables.  She denies any melena or hematochezia.  She states that she is going through "the change".  She was 17 days late then had heavy menses on Monday, 04/08/2016, for 2 days.  She denies any hematuria.   Past Medical History:  Diagnosis Date  . Anemia   . Hypothyroidism   . Thyroid disease     Past Surgical History:  Procedure Laterality Date  . COLONOSCOPY WITH PROPOFOL N/A 01/01/2016   Procedure: COLONOSCOPY WITH PROPOFOL;  Surgeon: Jonathon Bellows, MD;  Location: [facility] ENDOSCOPY;  Service: Endoscopy;  Laterality: N/A;  . ESOPHAGOGASTRODUODENOSCOPY (EGD) WITH PROPOFOL N/A 01/01/2016   Procedure: ESOPHAGOGASTRODUODENOSCOPY (EGD) WITH PROPOFOL;  Surgeon: Jonathon Bellows, MD;  Location: [facility] ENDOSCOPY;  Service: Endoscopy;  Laterality: N/A;  . THYROID SURGERY   1997   had thyroid removed  . TUBAL LIGATION  1995    Family History  Problem Relation Age of Onset  . Colon cancer Father   . Hypertension Mother   . Diabetes Mother     Social History:  reports that she has never smoked. She has never used smokeless tobacco. She reports that she drinks alcohol. She reports that she does not use drugs.  The patient is from [place].  She came to the [place] in 1980.  She lives in [place].  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  She has 3 children (ages 58, 59, 26) who are alive and well.  No family history of anemia or thalassesmia.  The patient is alone today.  Allergies: No Known Allergies  Current Medications: Current Outpatient Prescriptions  Medication Sig Dispense Refill  . levothyroxine (SYNTHROID, LEVOTHROID) 100 MCG tablet Take 100 mcg by mouth daily before breakfast.    . Multiple Vitamins-Iron (MULTIVITAMIN/IRON PO) Take by mouth.     No current facility-administered medications for this visit.     Review of Systems:  GENERAL:  Feels "alright".  No fevers or sweats.  Weight up 5 pounds. PERFORMANCE STATUS (ECOG):  0 HEENT:  No visual changes, runny nose, sore throat, mouth sores or tenderness.   Lungs:  No shortness of breath or cough.  No hemoptysis. Cardiac:  No chest pain, palpitations, orthopnea, or PND. GI:  Diet is good.  No nausea, vomiting, diarrhea, constipation, melena or hematochezia. GU:  No urgency, frequency, dysuria, or  hematuria.  Notes "going through the change".  Musculoskeletal:  No back pain.  No joint pain.  No muscle tenderness. Extremities:  No pain or swelling. Skin:  No rashes or skin changes. Neuro:  No headache.  numbness or weakness, balance or coordination issues. Endocrine:  No diabetes.  Thyroid disease on Synthroid.  No  hot flashes or night sweats. Psych:  No mood changes, depression or anxiety. Pain:  No focal pain. Review of systems:  All other systems reviewed and found to be  negative.  Physical Exam: Blood pressure 120/78, pulse 76, temperature 98.6 F (37 C), temperature source Tympanic, resp. rate 18, weight 167 lb 15.9 oz (76.2 kg). GENERAL:  Well developed, well nourished, woman sitting comfortably in the exam room in no acute distress. MENTAL STATUS:  Alert and oriented to person, place and time. HEAD:  Long brown hair.  Normocephalic, atraumatic, face symmetric, no Cushingoid features. EYES:  Glasses.  Brown eyes.  Pupils equal round and reactive to light and accomodation.  No conjunctivitis.  Faint scleral icterus. ENT:  Oropharynx clear without lesion.  Tongue normal. Mucous membranes moist.  RESPIRATORY:  Clear to auscultation without rales, wheezes or rhonchi. CARDIOVASCULAR:  Regular rate and rhythm without murmur, rub or gallop. ABDOMEN:  Soft, non-tender, with active bowel sounds, and no hepatosplenomegaly.  No masses. SKIN:  Tan.  No rashes, ulcers or lesions. EXTREMITIES: No edema, no skin discoloration or tenderness.  No palpable cords. LYMPH NODES: No palpable cervical, supraclavicular, axillary or inguinal adenopathy  NEUROLOGICAL: Unremarkable. PSYCH:  Appropriate.   No visits with results within 3 Day(s) from this visit.  Latest known visit with results is:  Office Visit on 10/25/2014  Component Date Value Ref Range Status  . Preg Test, Ur 10/25/2014 Negative  Negative Final  . PATH REPORT.SITE OF ORIGIN [facility] 10/27/2014 Comment   Final   Comment: Material submitted:                                        Marland Kitchen EMDOMETRIUM, BIOPSY   . Marland Kitchen 10/27/2014 Comment   Final   Comment: Clinician provided ICD-10: N93.9 N92.1   . PATH REPORT.FINAL DX [facility] 10/27/2014 Comment   Final   Comment:  Diagnosis: EMDOMETRIUM, BIOPSY: SECRETORY ENDOMETRIUM. NO HYPERPLASIA OR CARCINOMA. SMALL BENIGN ENDOCERVICAL POLYP. BUL/10/27/2014    . SIGNED OUT BY: 10/27/2014 Comment   Final   Comment: Electronically signed:                                      . Hoy Finlay, MD, Pathologist   . GROSS DESCRIPTION: 10/27/2014 Comment   Final   Comment: Johney Maine description:                                         . 1 Container, formalin-filled, labeled with patient identification. EMDOMETRIUM, BIOPSY: Received in formalin is 1.5 X 3 X .3 cm in aggregate of tan and brown material.  Tissue is submitted in toto in 1 cassette(s). /SBB /SBB   . Marland Kitchen 10/27/2014 Comment   Final   Comment: Pathologist provided ICD-10: N84.1   . PAYMENT PROCEDURE 10/27/2014 Comment   Final   Comment: CPT                                                        .  [id number]    [facility] Labs: CBC on 08/24/2015 revealed a hematocrit of 30.2, hemoglobin 11.9, and MCV 58.  Ferritin was 8 with an iron saturation of 8% and a TIBC of 365. CBC on 10/12/2015 revealed a hematocrit of 33.5, hemoglobin 10.5, and MCV 60.  Ferritin was 98. CBC on 03/01/2016 revealed a hematocrit of 25.1, hemoglobin 7.7, MCV 55, platelets 310,000, white count 4800.  Ferritin was 6. Iron saturation was 5% with a TIBC of 386.  Comprehensive metabolic panel was normal.   CBC on 04/17/2016 revealed a hematocrit of 27.6, hemoglobin 8, MCV 54, platelets 282,000, white count 4000 with an ANC of 1700. Ferritin was 5.  Iron saturation was 6% with a TIBC of 386.   Assessment:  Beverly Campbell is a 51 y.o. female from [place] with a long standing history of anemia.  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  Oral iron causes diarrhea.  EGD on 01/01/2016 was normal.  Duodenal biopsy was negative   Colonoscopy on 01/01/2016 revealed diverticulosis in the entire colon and non-bleeding internal hemorrhoids.  She denies any melena or hematochezia.  Diet is good.  She has a history of of ice pica.  Normal labs on 08/24/2015 included:  B12, folate, and TSH.  She received Feraheme 1020 mg on 04/05/2013.  Ferritin improved from 22 to 273.  Hematocrit increased to  32.3.  Microcytic indices persisted.  She received Feraheme 510 mg on 09/20/2015.   Ferritin has been followed:  8 on 08/24/2015, 98 on 10/12/2015, 6 on 03/01/2016, and 5 on 04/17/2016.  Symptomatically, she feels good.  Exam reveals faint scleral icterus.  Plan: 1.  Review [facility] labs from 03/01/2016.  Discuss recurrent iron deficiency.  Discuss history of heavy menses.  Patient feels peri-menopausal.  Discuss repeat labs today.  Concern for unchecked iron deficiency anemia and marked anemia in 03/01/2016.   2.  Discuss EGD and colonoscopy.  Studies were normal.  Discuss consideration of capsule study.  Discuss urinalysis to ensure no hematuria. 3.  Feraheme 510 mg IV today and in 1 week. 4.  Patient has [facility] slips for 2 weeks and 6 weeks 5.  RTC after 6 week lab for MD assessment, review of labs and +/- additional Feraheme   Lequita Asal, MD  04/17/2016, 10:56 AM

## 2016-04-17 NOTE — Progress Notes (Signed)
Patient offers no concerns today. 

## 2016-04-18 ENCOUNTER — Encounter: Payer: Self-pay | Admitting: Hematology and Oncology

## 2016-04-22 ENCOUNTER — Other Ambulatory Visit: Payer: Self-pay | Admitting: *Deleted

## 2016-04-22 ENCOUNTER — Inpatient Hospital Stay: Payer: 59 | Attending: Hematology and Oncology

## 2016-04-22 DIAGNOSIS — Z79899 Other long term (current) drug therapy: Secondary | ICD-10-CM | POA: Insufficient documentation

## 2016-04-22 DIAGNOSIS — E039 Hypothyroidism, unspecified: Secondary | ICD-10-CM | POA: Insufficient documentation

## 2016-04-22 DIAGNOSIS — N920 Excessive and frequent menstruation with regular cycle: Secondary | ICD-10-CM | POA: Diagnosis not present

## 2016-04-22 DIAGNOSIS — D500 Iron deficiency anemia secondary to blood loss (chronic): Secondary | ICD-10-CM | POA: Insufficient documentation

## 2016-04-22 DIAGNOSIS — D509 Iron deficiency anemia, unspecified: Secondary | ICD-10-CM

## 2016-04-22 DIAGNOSIS — K648 Other hemorrhoids: Secondary | ICD-10-CM | POA: Diagnosis not present

## 2016-04-22 DIAGNOSIS — K5730 Diverticulosis of large intestine without perforation or abscess without bleeding: Secondary | ICD-10-CM | POA: Diagnosis not present

## 2016-04-22 LAB — PREGNANCY, URINE: Preg Test, Ur: NEGATIVE

## 2016-04-23 ENCOUNTER — Inpatient Hospital Stay: Payer: 59

## 2016-04-23 VITALS — BP 116/70 | HR 72 | Temp 97.2°F | Resp 18

## 2016-04-23 DIAGNOSIS — D500 Iron deficiency anemia secondary to blood loss (chronic): Secondary | ICD-10-CM | POA: Diagnosis not present

## 2016-04-23 DIAGNOSIS — D509 Iron deficiency anemia, unspecified: Secondary | ICD-10-CM

## 2016-04-23 MED ORDER — FERUMOXYTOL INJECTION 510 MG/17 ML
510.0000 mg | Freq: Once | INTRAVENOUS | Status: AC
  Administered 2016-04-23: 510 mg via INTRAVENOUS
  Filled 2016-04-23: qty 17

## 2016-04-23 MED ORDER — SODIUM CHLORIDE 0.9 % IV SOLN
Freq: Once | INTRAVENOUS | Status: AC
  Administered 2016-04-23: 11:00:00 via INTRAVENOUS
  Filled 2016-04-23: qty 1000

## 2016-04-23 NOTE — Patient Instructions (Signed)

## 2016-04-24 ENCOUNTER — Ambulatory Visit: Payer: 59

## 2016-04-26 ENCOUNTER — Other Ambulatory Visit: Payer: Self-pay | Admitting: *Deleted

## 2016-04-26 ENCOUNTER — Inpatient Hospital Stay: Payer: 59

## 2016-05-01 ENCOUNTER — Telehealth: Payer: Self-pay | Admitting: *Deleted

## 2016-05-01 NOTE — Telephone Encounter (Signed)
Called and left message for patient to pick up specimen container in [place] for stool guaic as the cards were not what [facility] required. Instructed to call with questions.

## 2016-05-15 ENCOUNTER — Telehealth: Payer: Self-pay | Admitting: *Deleted

## 2016-05-15 NOTE — Telephone Encounter (Signed)
Left message for patient to call back r/t need for ferritin level added to next lab draw

## 2016-05-29 ENCOUNTER — Ambulatory Visit: Payer: 59

## 2016-05-29 ENCOUNTER — Ambulatory Visit: Payer: 59 | Admitting: Hematology and Oncology

## 2016-06-05 ENCOUNTER — Inpatient Hospital Stay: Payer: 59 | Attending: Hematology and Oncology | Admitting: Hematology and Oncology

## 2016-06-05 ENCOUNTER — Inpatient Hospital Stay: Payer: 59

## 2016-06-05 ENCOUNTER — Encounter: Payer: Self-pay | Admitting: Hematology and Oncology

## 2016-06-05 VITALS — BP 100/68 | HR 64 | Temp 98.5°F | Resp 18 | Wt 167.8 lb

## 2016-06-05 DIAGNOSIS — D509 Iron deficiency anemia, unspecified: Secondary | ICD-10-CM | POA: Diagnosis not present

## 2016-06-05 DIAGNOSIS — E039 Hypothyroidism, unspecified: Secondary | ICD-10-CM

## 2016-06-05 DIAGNOSIS — K5730 Diverticulosis of large intestine without perforation or abscess without bleeding: Secondary | ICD-10-CM | POA: Diagnosis not present

## 2016-06-05 DIAGNOSIS — K648 Other hemorrhoids: Secondary | ICD-10-CM | POA: Diagnosis not present

## 2016-06-05 DIAGNOSIS — N841 Polyp of cervix uteri: Secondary | ICD-10-CM | POA: Diagnosis not present

## 2016-06-05 DIAGNOSIS — Z79899 Other long term (current) drug therapy: Secondary | ICD-10-CM | POA: Diagnosis not present

## 2016-06-05 DIAGNOSIS — D582 Other hemoglobinopathies: Secondary | ICD-10-CM | POA: Diagnosis not present

## 2016-06-05 DIAGNOSIS — N920 Excessive and frequent menstruation with regular cycle: Secondary | ICD-10-CM | POA: Diagnosis not present

## 2016-06-05 NOTE — Progress Notes (Signed)
Patient offers no complaints today. 

## 2016-06-05 NOTE — Progress Notes (Signed)
[facility] day:  06/05/16  Chief Complaint: Beverly Campbell is a 51 y.o. female with iron deficiency anemia who is seen for 6 week assessment.  HPI:  The patient was last seen in the hematology clinic on 04/17/2016.  At that time, she felt good.  Exam revealed faint scleral icterus.  Hematocrit was 27.6, hemoglobin 8, and MCV 54.  Ferritin was 5.  She received Feraheme 510 mg on 04/17/2016 and 04/23/2016.  CBC on 06/03/2016 revealed a hematocrit of 33.6, hemoglobin 10.6, MCV 61, platelets 192,000, white count 3800 with an ANC of 1700. Ferritin was 77.  Iron saturation was 44% with a TIBC of 277.  Symptomatically, she feels "pretty good". Menses are regular. The first 2 days are heavy; she changes her pad every 30 minutes to an hour. Menses started today. She states that she is eating "properly"   Past Medical History:  Diagnosis Date  . Anemia   . Hypothyroidism   . Thyroid disease     Past Surgical History:  Procedure Laterality Date  . COLONOSCOPY WITH PROPOFOL N/A 01/01/2016   Procedure: COLONOSCOPY WITH PROPOFOL;  Surgeon: Jonathon Bellows, MD;  Location: [facility] ENDOSCOPY;  Service: Endoscopy;  Laterality: N/A;  . ESOPHAGOGASTRODUODENOSCOPY (EGD) WITH PROPOFOL N/A 01/01/2016   Procedure: ESOPHAGOGASTRODUODENOSCOPY (EGD) WITH PROPOFOL;  Surgeon: Jonathon Bellows, MD;  Location: [facility] ENDOSCOPY;  Service: Endoscopy;  Laterality: N/A;  . THYROID SURGERY  1997   had thyroid removed  . TUBAL LIGATION  1995    Family History  Problem Relation Age of Onset  . Colon cancer Father   . Hypertension Mother   . Diabetes Mother     Social History:  reports that she has never smoked. She has never used smokeless tobacco. She reports that she drinks alcohol. She reports that she does not use drugs.  The patient is from [place].  She came to the [place] in 1980.  She lives in [place].  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  She has 3  children (ages 33, 68, 11) who are alive and well.  No family history of anemia or thalassesmia.  The patient is alone today.  Allergies: No Known Allergies  Current Medications: Current Outpatient Prescriptions  Medication Sig Dispense Refill  . levothyroxine (SYNTHROID, LEVOTHROID) 100 MCG tablet Take 100 mcg by mouth daily before breakfast.    . Multiple Vitamins-Iron (MULTIVITAMIN/IRON PO) Take by mouth.     No current facility-administered medications for this visit.     Review of Systems:  GENERAL:  Feels "pretty good".  No fevers or sweats.  Weight stable. PERFORMANCE STATUS (ECOG):  0 HEENT:  No visual changes, runny nose, sore throat, mouth sores or tenderness.   Lungs:  No shortness of breath or cough.  No hemoptysis. Cardiac:  No chest pain, palpitations, orthopnea, or PND. GI:  Diet is good.  No nausea, vomiting, diarrhea, constipation, melena or hematochezia. GU:  No urgency, frequency, dysuria, or hematuria.  Menses started today.  Musculoskeletal:  No back pain.  No joint pain.  No muscle tenderness. Extremities:  No pain or swelling. Skin:  No rashes or skin changes. Neuro:  No headache.  numbness or weakness, balance or coordination issues. Endocrine:  No diabetes.  Thyroid disease on Synthroid.  No  hot flashes or night sweats. Psych:  No mood changes, depression or anxiety. Pain:  No focal pain. Review of systems:  All other systems reviewed and found to be  negative.  Physical Exam: Blood pressure 100/68, pulse 64, temperature 98.5 F (36.9 C), temperature source Tympanic, resp. rate 18, weight 167 lb 12.3 oz (76.1 kg). GENERAL:  Well developed, well nourished, woman sitting comfortably in the exam room in no acute distress. MENTAL STATUS:  Alert and oriented to person, place and time. HEAD:  Long brown hair.  Normocephalic, atraumatic, face symmetric, no Cushingoid features. EYES:  Glasses.  Brown eyes.  Pupils equal round and reactive to light and  accomodation.  No conjunctivitis.  Faint scleral icterus. ENT:  Oropharynx clear without lesion.  Tongue normal. Mucous membranes moist.  RESPIRATORY:  Clear to auscultation without rales, wheezes or rhonchi. CARDIOVASCULAR:  Regular rate and rhythm without murmur, rub or gallop. ABDOMEN:  Soft, non-tender, with active bowel sounds, and no hepatosplenomegaly.  No masses. SKIN:  Tan.  No rashes, ulcers or lesions. EXTREMITIES: No edema, no skin discoloration or tenderness.  No palpable cords. LYMPH NODES: No palpable cervical, supraclavicular, axillary or inguinal adenopathy  NEUROLOGICAL: Unremarkable. PSYCH:  Appropriate.   No visits with results within 3 Day(s) from this visit.  Latest known visit with results is:  Office Visit on 10/25/2014  Component Date Value Ref Range Status  . Preg Test, Ur 10/25/2014 Negative  Negative Final  . PATH REPORT.SITE OF ORIGIN [facility] 10/27/2014 Comment   Final   Comment: Material submitted:                                        Marland Kitchen EMDOMETRIUM, BIOPSY   . Marland Kitchen 10/27/2014 Comment   Final   Comment: Clinician provided ICD-10: N93.9 N92.1   . PATH REPORT.FINAL DX [facility] 10/27/2014 Comment   Final   Comment:  Diagnosis: EMDOMETRIUM, BIOPSY: SECRETORY ENDOMETRIUM. NO HYPERPLASIA OR CARCINOMA. SMALL BENIGN ENDOCERVICAL POLYP. BUL/10/27/2014    . SIGNED OUT BY: 10/27/2014 Comment   Final   Comment: Electronically signed:                                     . Hoy Finlay, MD, Pathologist   . GROSS DESCRIPTION: 10/27/2014 Comment   Final   Comment: Johney Maine description:                                         . 1 Container, formalin-filled, labeled with patient identification. EMDOMETRIUM, BIOPSY: Received in formalin is 1.5 X 3 X .3 cm in aggregate of tan and brown material.  Tissue is submitted in toto in 1 cassette(s). /SBB /SBB   . Marland Kitchen 10/27/2014 Comment   Final   Comment: Pathologist provided ICD-10: N84.1   . PAYMENT PROCEDURE 10/27/2014  Comment   Final   Comment: CPT                                                        . [id number]    [facility] Labs: CBC on 08/24/2015 revealed a hematocrit of 30.2, hemoglobin 11.9, and MCV 58.  Ferritin was 8 with an iron saturation of 8% and a TIBC of 365. CBC on 10/12/2015  revealed a hematocrit of 33.5, hemoglobin 10.5, and MCV 60.  Ferritin was 98. CBC on 03/01/2016 revealed a hematocrit of 25.1, hemoglobin 7.7, MCV 55, platelets 310,000, white count 4800.  Ferritin was 6. Iron saturation was 5% with a TIBC of 386.  CMP was normal.   CBC on 04/17/2016 revealed a hematocrit of 27.6, hemoglobin 8, MCV 54, platelets 282,000, white count 4000 with an ANC of 1700. Ferritin was 5.  Iron saturation was 6% with a TIBC of 386. CBC on 06/03/2016 revealed a hematocrit of 33.6, hemoglobin 10.6, MCV 61, platelets 192,000, white count 3800 with an ANC of 1700. Ferritin was 77.  Iron saturation was 44% with a TIBC of 277.   Assessment:  Sharona Grassia is a 51 y.o. female from [place] with a long standing history of anemia.  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  Oral iron causes diarrhea.  EGD on 01/01/2016 was normal.  Duodenal biopsy was negative   Colonoscopy on 01/01/2016 revealed diverticulosis in the entire colon and non-bleeding internal hemorrhoids.  She denies any melena or hematochezia.  Diet is good.  She has a history of of ice pica.  Normal labs on 08/24/2015 included:  B12, folate, and TSH.  She received Feraheme 1020 mg on 04/05/2013.  Ferritin improved from 22 to 273.  Hematocrit increased to 32.3.  Microcytic indices persisted.  She received Feraheme 510 mg on 09/20/2015, 04/17/2016, and 04/23/2016.   Ferritin has been followed:  8 on 08/24/2015, 98 on 10/12/2015, 6 on 03/01/2016, 5 on 04/17/2016, and 77 on 06/03/2016.  Symptomatically, she feels good.  Exam is unremarkable.  Iron stores are normal.  Plan: 1.  Review  [facility] labs from 06/03/2016.  Iron stores are adequate.  Recurrent iron deficiency anemia appears related to heavy menses. 2.  Discuss consideration of capsule study.  Discuss urinalysis to ensure no hematuria. 3.  No IV iron today. 4.  [facility] slips prior to next appointment. 5.  RTC in 3 months for MD assessment, review of labs and +/- Feraheme.   Lequita Asal, MD  06/05/2016, 10:38 AM

## 2016-07-08 ENCOUNTER — Encounter: Payer: Self-pay | Admitting: Hematology and Oncology

## 2016-07-10 ENCOUNTER — Other Ambulatory Visit: Payer: Self-pay | Admitting: Nurse Practitioner

## 2016-08-28 ENCOUNTER — Inpatient Hospital Stay: Payer: 59

## 2016-08-28 ENCOUNTER — Inpatient Hospital Stay: Payer: 59 | Admitting: Hematology and Oncology

## 2016-08-28 NOTE — Progress Notes (Deleted)
[facility] day:  08/28/16  Chief Complaint: Beverly Campbell is a 51 y.o. female with iron deficiency anemia who is seen for 3 month assessment.  HPI:  The patient was last seen in the hematology clinic on 06/05/2016.  At that time, she felt good.  She continued to have heavy menses.  Hematocrit was 33.6, hemoglobin 10.6, and MCV 61.  Ferritin was 77.  She did not receive IV iron.  CBC on 06/03/2016 revealed a hematocrit of 33.6, hemoglobin 10.6, MCV 61, platelets 192,000, white count 3800 with an ANC of 1700. Ferritin was 77.  Iron saturation was 44% with a TIBC of 277.  Symptomatically, she feels "pretty good". Menses are regular. The first 2 days are heavy; she changes her pad every 30 minutes to an hour. Menses started today. She states that she is eating "properly"   Past Medical History:  Diagnosis Date  . Anemia   . Hypothyroidism   . Thyroid disease     Past Surgical History:  Procedure Laterality Date  . COLONOSCOPY WITH PROPOFOL N/A 01/01/2016   Procedure: COLONOSCOPY WITH PROPOFOL;  Surgeon: Jonathon Bellows, MD;  Location: [facility] ENDOSCOPY;  Service: Endoscopy;  Laterality: N/A;  . ESOPHAGOGASTRODUODENOSCOPY (EGD) WITH PROPOFOL N/A 01/01/2016   Procedure: ESOPHAGOGASTRODUODENOSCOPY (EGD) WITH PROPOFOL;  Surgeon: Jonathon Bellows, MD;  Location: [facility] END[facility];  Service: Endoscopy;  Laterality: N/A;  . THYROID SURGERY  1997   had thyroid removed  . TUBAL LIGATION  1995    Family History  Problem Relation Age of Onset  . Colon cancer Father   . Hypertension Mother   . Diabetes Mother     Social History:  reports that she has never smoked. She has never used smokeless tobacco. She reports that she drinks alcohol. She reports that she does not use drugs.  The patient is from [place].  She came to the [place] in 1980.  She lives in [place].  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  She has 3 children (ages 34, 44, 62) who are  alive and well.  No family history of anemia or thalassesmia.  The patient is alone today.  Allergies: No Known Allergies  Current Medications: Current Outpatient Prescriptions  Medication Sig Dispense Refill  . levothyroxine (SYNTHROID, LEVOTHROID) 100 MCG tablet Take 100 mcg by mouth daily before breakfast.    . Multiple Vitamins-Iron (MULTIVITAMIN/IRON PO) Take by mouth.     No current facility-administered medications for this visit.     Review of Systems:  GENERAL:  Feels "pretty good".  No fevers or sweats.  Weight stable. PERFORMANCE STATUS (ECOG):  0 HEENT:  No visual changes, runny nose, sore throat, mouth sores or tenderness.   Lungs:  No shortness of breath or cough.  No hemoptysis. Cardiac:  No chest pain, palpitations, orthopnea, or PND. GI:  Diet is good.  No nausea, vomiting, diarrhea, constipation, melena or hematochezia. GU:  No urgency, frequency, dysuria, or hematuria.  Menses started today.  Musculoskeletal:  No back pain.  No joint pain.  No muscle tenderness. Extremities:  No pain or swelling. Skin:  No rashes or skin changes. Neuro:  No headache.  numbness or weakness, balance or coordination issues. Endocrine:  No diabetes.  Thyroid disease on Synthroid.  No  hot flashes or night sweats. Psych:  No mood changes, depression or anxiety. Pain:  No focal pain. Review of systems:  All other systems reviewed and found to be negative.  Physical Exam: There were no vitals taken for this visit. GENERAL:  Well developed, well nourished, woman sitting comfortably in the exam room in no acute distress. MENTAL STATUS:  Alert and oriented to person, place and time. HEAD:  Long brown hair.  Normocephalic, atraumatic, face symmetric, no Cushingoid features. EYES:  Glasses.  Brown eyes.  Pupils equal round and reactive to light and accomodation.  No conjunctivitis.  Faint scleral icterus. ENT:  Oropharynx clear without lesion.  Tongue normal. Mucous membranes moist.   RESPIRATORY:  Clear to auscultation without rales, wheezes or rhonchi. CARDIOVASCULAR:  Regular rate and rhythm without murmur, rub or gallop. ABDOMEN:  Soft, non-tender, with active bowel sounds, and no hepatosplenomegaly.  No masses. SKIN:  Tan.  No rashes, ulcers or lesions. EXTREMITIES: No edema, no skin discoloration or tenderness.  No palpable cords. LYMPH NODES: No palpable cervical, supraclavicular, axillary or inguinal adenopathy  NEUROLOGICAL: Unremarkable. PSYCH:  Appropriate.   No visits with results within 3 Day(s) from this visit.  Latest known visit with results is:  Office Visit on 10/25/2014  Component Date Value Ref Range Status  . Preg Test, Ur 10/25/2014 Negative  Negative Final  . PATH REPORT.SITE OF ORIGIN [facility] 10/27/2014 Comment   Final   Comment: Material submitted:                                        Marland Kitchen EMDOMETRIUM, BIOPSY   . Marland Kitchen 10/27/2014 Comment   Final   Comment: Clinician provided ICD-10: N93.9 N92.1   . PATH REPORT.FINAL DX [facility] 10/27/2014 Comment   Final   Comment:  Diagnosis: EMDOMETRIUM, BIOPSY: SECRETORY ENDOMETRIUM. NO HYPERPLASIA OR CARCINOMA. SMALL BENIGN ENDOCERVICAL POLYP. BUL/10/27/2014    . SIGNED OUT BY: 10/27/2014 Comment   Final   Comment: Electronically signed:                                     . Hoy Finlay, MD, Pathologist   . GROSS DESCRIPTION: 10/27/2014 Comment   Final   Comment: Johney Maine description:                                         . 1 Container, formalin-filled, labeled with patient identification. EMDOMETRIUM, BIOPSY: Received in formalin is 1.5 X 3 X .3 cm in aggregate of tan and brown material.  Tissue is submitted in toto in 1 cassette(s). /SBB /SBB   . Marland Kitchen 10/27/2014 Comment   Final   Comment: Pathologist provided ICD-10: N84.1   . PAYMENT PROCEDURE 10/27/2014 Comment   Final   Comment: CPT                                                        . [id number]    [facility] Labs: CBC on 08/24/2015  revealed a hematocrit of 30.2, hemoglobin 11.9, and MCV 58.  Ferritin was 8 with an iron saturation of 8% and a TIBC of 365. CBC on 10/12/2015 revealed a hematocrit of 33.5, hemoglobin 10.5, and MCV 60.  Ferritin was 98. CBC on 03/01/2016  revealed a hematocrit of 25.1, hemoglobin 7.7, MCV 55, platelets 310,000, white count 4800.  Ferritin was 6. Iron saturation was 5% with a TIBC of 386.  CMP was normal.   CBC on 04/17/2016 revealed a hematocrit of 27.6, hemoglobin 8, MCV 54, platelets 282,000, white count 4000 with an ANC of 1700. Ferritin was 5.  Iron saturation was 6% with a TIBC of 386. CBC on 06/03/2016 revealed a hematocrit of 33.6, hemoglobin 10.6, MCV 61, platelets 192,000, white count 3800 with an ANC of 1700. Ferritin was 77.  Iron saturation was 44% with a TIBC of 277.   Assessment:  Manali Gerlich is a 51 y.o. female from [place] with a long standing history of anemia.  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  Oral iron causes diarrhea.  EGD on 01/01/2016 was normal.  Duodenal biopsy was negative   Colonoscopy on 01/01/2016 revealed diverticulosis in the entire colon and non-bleeding internal hemorrhoids.  Stool was guaiac negative on 06/04/2016.  She denies any melena or hematochezia.  Diet is good.  She has a history of of ice pica.  Normal labs on 08/24/2015 included:  B12, folate, and TSH.  She received Feraheme 1020 mg on 04/05/2013.  Ferritin improved from 22 to 273.  Hematocrit increased to 32.3.  Microcytic indices persisted.  She received Feraheme 510 mg on 09/20/2015, 04/17/2016, and 04/23/2016.   Ferritin has been followed:  8 on 08/24/2015, 98 on 10/12/2015, 6 on 03/01/2016, 5 on 04/17/2016, and 77 on 06/03/2016.  Symptomatically, she feels good.  Exam is unremarkable.  Iron stores are normal.  Plan: 1.  Review [facility] labs from 06/03/2016.  Iron stores are adequate.  Recurrent iron deficiency anemia  appears related to heavy menses. 2.  Discuss consideration of capsule study.  Discuss urinalysis to ensure no hematuria. 3.  No IV iron today. 4.  [facility] slips prior to next appointment. 5.  RTC in 3 months for MD assessment, review of labs and +/- Feraheme.   Lequita Asal, MD  08/28/2016, 4:52 AM

## 2016-09-11 ENCOUNTER — Other Ambulatory Visit: Payer: Self-pay | Admitting: Nurse Practitioner

## 2016-09-11 DIAGNOSIS — Z1231 Encounter for screening mammogram for malignant neoplasm of breast: Secondary | ICD-10-CM

## 2016-09-17 NOTE — Progress Notes (Signed)
[facility] day:  09/18/16  Chief Complaint: Beverly Campbell is a 51 y.o. female with iron deficiency anemia who is seen for 3 month assessment.  HPI:  The patient was last seen in the hematology clinic on 06/05/2016.  At that time, patient was feeling "good". She was experiencing some irregular menses, with the first 2 days of her cycle being heavy requiring her to change pads more frequently. Exam revealed faint scleral icterus.  Hematocrit was 33.6, hemoglobin 10.6, and MCV 61, MCH 19.2. Ferritin was 77.  Iron saturation was 44% with a TIBC of 277. Fecal occult blood testing negative on 06/04/2016.  CBC on 09/11/2016 revealed a hematocrit of 32.7, hemoglobin 10.8, MCV 60, platelets 204,000, white count 4800 with an ANC of 2700. Ferritin was 11.  Iron saturation was 17% with a TIBC of 337.  B12 was 414.  Folate >20.  UA was negative for blood.   Symptomatically, she is feeling well.  She denies melena, hematochezia, and epistaxis. She is having irregular menses; periods are heavy for the first 2 days; states, "It just gushes. I have to change clothes".  She denies vasomotor symptoms.  She is seeing Dr. Vicente Males of gastroenterology. They were discussing doing a capsule study back in 12/2015.  Patient declined study at that time citing that she "didnt feel like anything was wrong".    Past Medical History:  Diagnosis Date  . Anemia   . Hypothyroidism   . Thyroid disease     Past Surgical History:  Procedure Laterality Date  . COLONOSCOPY WITH PROPOFOL N/A 01/01/2016   Procedure: COLONOSCOPY WITH PROPOFOL;  Surgeon: Jonathon Bellows, MD;  Location: [facility] ENDOSCOPY;  Service: Endoscopy;  Laterality: N/A;  . ESOPHAGOGASTRODUODENOSCOPY (EGD) WITH PROPOFOL N/A 01/01/2016   Procedure: ESOPHAGOGASTRODUODENOSCOPY (EGD) WITH PROPOFOL;  Surgeon: Jonathon Bellows, MD;  Location: [facility] ENDOSCOPY;  Service: Endoscopy;  Laterality: N/A;  . THYROID SURGERY  1997    had thyroid removed  . TUBAL LIGATION  1995    Family History  Problem Relation Age of Onset  . Colon cancer Father   . Hypertension Mother   . Diabetes Mother     Social History:  reports that she has never smoked. She has never used smokeless tobacco. She reports that she drinks alcohol. She reports that she does not use drugs.  The patient is from [place].  She came to the [place] in 1980.  She lives in [place].  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  She has 3 children (ages 38, 26, 42) who are alive and well.  No family history of anemia or thalassesmia.  The patient is alone today.  Allergies: No Known Allergies  Current Medications: Current Outpatient Prescriptions  Medication Sig Dispense Refill  . levothyroxine (SYNTHROID, LEVOTHROID) 100 MCG tablet Take 100 mcg by mouth daily before breakfast.    . Multiple Vitamins-Iron (MULTIVITAMIN/IRON PO) Take by mouth.     No current facility-administered medications for this visit.     Review of Systems:  GENERAL:  Feels "pretty good".  No fevers or sweats.  Weight is up 4 pounds PERFORMANCE STATUS (ECOG):  0 HEENT:  No visual changes, runny nose, sore throat, mouth sores or tenderness.   Lungs:  No shortness of breath or cough.  No hemoptysis. Cardiac:  No chest pain, palpitations, orthopnea, or PND. GI:  Diet is good.  No nausea, vomiting, diarrhea, constipation, melena or hematochezia. GU:  No urgency, frequency,  dysuria, or hematuria.  Menses started today.  Musculoskeletal:  No back pain.  No joint pain.  No muscle tenderness. Extremities:  No pain or swelling. Skin:  No rashes or skin changes. Neuro:  No headache.  numbness or weakness, balance or coordination issues. Endocrine:  No diabetes.  Thyroid disease on Synthroid.  No  hot flashes or night sweats. Psych:  No mood changes, depression or anxiety. Pain:  No focal pain. Review of systems:  All other systems reviewed and found to be negative.  Physical  Exam: Blood pressure 107/73, pulse 67, temperature 97.8 F (36.6 C), temperature source Tympanic, resp. rate 18, weight 171 lb 8.3 oz (77.8 kg). GENERAL:  Well developed, well nourished, woman sitting comfortably in the exam room in no acute distress. MENTAL STATUS:  Alert and oriented to person, place and time. HEAD:  Long brown hair.  Normocephalic, atraumatic, face symmetric, no Cushingoid features. EYES:  Glasses.  Brown eyes.  Pupils equal round and reactive to light and accomodation.  No conjunctivitis.  Faint scleral icterus. ENT:  Oropharynx clear without lesion.  Tongue normal. Mucous membranes moist.  RESPIRATORY:  Clear to auscultation without rales, wheezes or rhonchi. CARDIOVASCULAR:  Regular rate and rhythm without murmur, rub or gallop. ABDOMEN:  Soft, non-tender, with active bowel sounds, and no hepatosplenomegaly.  No masses. SKIN:  Tan.  No rashes, ulcers or lesions. EXTREMITIES: No edema, no skin discoloration or tenderness.  No palpable cords. LYMPH NODES: No palpable cervical, supraclavicular, axillary or inguinal adenopathy  NEUROLOGICAL: Unremarkable. PSYCH:  Appropriate.   No visits with results within 3 Day(s) from this visit.  Latest known visit with results is:  Office Visit on 10/25/2014  Component Date Value Ref Range Status  . Preg Test, Ur 10/25/2014 Negative  Negative Final  . PATH REPORT.[facility] 10/27/2014 Comment   Final   Comment: Material submitted:                                        Marland Kitchen EMDOMETRIUM, BIOPSY   . Marland Kitchen 10/27/2014 Comment   Final   Comment: Clinician provided ICD-10: N93.9 N92.1   . PATH REPORT.FINAL DX [facility] 10/27/2014 Comment   Final   Comment:  Diagnosis: EMDOMETRIUM, BIOPSY: SECRETORY ENDOMETRIUM. NO HYPERPLASIA OR CARCINOMA. SMALL BENIGN ENDOCERVICAL POLYP. BUL/10/27/2014    . SIGNED OUT BY: 10/27/2014 Comment   Final   Comment: Electronically signed:                                     . Hoy Finlay, MD,  Pathologist   . GROSS DESCRIPTION: 10/27/2014 Comment   Final   Comment: Johney Maine description:                                         . 1 Container, formalin-filled, labeled with patient identification. EMDOMETRIUM, BIOPSY: Received in formalin is 1.5 X 3 X .3 cm in aggregate of tan and brown material.  Tissue is submitted in toto in 1 cassette(s). /SBB /SBB   . Marland Kitchen 10/27/2014 Comment   Final   Comment: Pathologist provided ICD-10: N84.1   . PAYMENT PROCEDURE 10/27/2014 Comment   Final   Comment: CPT                                                        .  [id number]    [facility] Labs: 08/24/2015: hematocrit of 30.2, hemoglobin 11.9, and MCV 58.  Ferritin was 8 with an iron saturation of 8% and a TIBC of 365. 10/12/2015: hematocrit of 33.5, hemoglobin 10.5, and MCV 60.  Ferritin was 98. 03/01/2016: hematocrit of 25.1, hemoglobin 7.7, MCV 55, platelets 310,000, white count 4800.  Ferritin was 6. Iron saturation was 5% with a TIBC of 386.  CMP was normal.   04/17/2016: hematocrit of 27.6, hemoglobin 8, MCV 54, platelets 282,000, white count 4000 with an ANC of 1700. Ferritin was 5.  Iron saturation was 6% with a TIBC of 386. 06/03/2016: hematocrit of 33.6, hemoglobin 10.6, MCV 61, platelets 192,000, white count 3800 with an ANC of 1700. Ferritin was 77.  Iron saturation 44% with a TIBC of 277. 09/11/2016: hematocrit of 32.7, hemoglobin 10.8, MCV 60, platelets 204,000, white count 4800 with an ANC of 2700. Ferritin was 11.  Iron saturation 17% with a TIBC of 337. B12 414. Folate >20. CMP lipid panel thyroid panel were normal. Vitamin D 21.3.    Assessment:  Beverly Campbell is a 51 y.o. female from [place] with a long standing history of anemia.  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  Oral iron causes diarrhea.  EGD on 01/01/2016 was normal.  Duodenal biopsy was negative   Colonoscopy on 01/01/2016 revealed diverticulosis in the  entire colon and non-bleeding internal hemorrhoids.  She denies any melena or hematochezia.  Diet is good.  She has a history of of ice pica.  Normal labs on 08/24/2015 and 09/11/2016 included:  B12, folate, and TSH.   She received Feraheme 1020 mg on 04/05/2013.  Ferritin improved from 22 to 273.  Hematocrit increased to 32.3.  Microcytic indices persisted.  She received Feraheme 510 mg on 09/20/2015, 04/17/2016, and 04/23/2016.   Ferritin has been followed:  8 on 08/24/2015, 98 on 10/12/2015, 6 on 03/01/2016, 5 on 04/17/2016, 77 on 06/03/2016, and 11 on 09/11/2016.  Symptomatically, she feels good.  Exam is unremarkable.  Iron stores are low at 11.  Plan: 1.  Review [facility] labs from 09/11/2016.  Iron stores are low; ferritin is 11.  Ferritin goal is > 100.  Patient having irregular menses that are initally heavy every month. Recurrent iron deficiency anemia may potentially be related to heavy menses, but other causes cannot be excluded without further workup.  2.  Discuss consideration of capsule study. Refer back to GI Vicente Males).  3.  Feraheme 510 mg today. 4.  [facility] slips prior to next appointment: 3 months (CBC with diff, ferritin) and 6 months (CBC with diff, CMP, ferritin). 5.  RTC in 6 months for MD assessment, review of labs and +/- Feraheme.   Honor Loh, NP  09/18/2016, 10:20 AM   I saw and evaluated the patient, participating in the key portions of the service and reviewing pertinent diagnostic studies and records.  I reviewed the nurse practitioner's note and agree with the findings and the plan.  The assessment and plan were discussed with the patient.  A few questions were asked by the patient and answered.   Lequita Asal, MD 09/18/2016,5:03 PM

## 2016-09-18 ENCOUNTER — Inpatient Hospital Stay: Payer: 59 | Attending: Hematology and Oncology | Admitting: Hematology and Oncology

## 2016-09-18 ENCOUNTER — Inpatient Hospital Stay: Payer: 59

## 2016-09-18 ENCOUNTER — Other Ambulatory Visit: Payer: Self-pay | Admitting: Hematology and Oncology

## 2016-09-18 VITALS — BP 107/73 | HR 67 | Temp 97.8°F | Resp 18 | Wt 171.5 lb

## 2016-09-18 VITALS — BP 113/76 | HR 58 | Temp 97.2°F | Resp 18

## 2016-09-18 DIAGNOSIS — D509 Iron deficiency anemia, unspecified: Secondary | ICD-10-CM

## 2016-09-18 DIAGNOSIS — E039 Hypothyroidism, unspecified: Secondary | ICD-10-CM | POA: Insufficient documentation

## 2016-09-18 DIAGNOSIS — K648 Other hemorrhoids: Secondary | ICD-10-CM | POA: Insufficient documentation

## 2016-09-18 DIAGNOSIS — D500 Iron deficiency anemia secondary to blood loss (chronic): Secondary | ICD-10-CM | POA: Insufficient documentation

## 2016-09-18 DIAGNOSIS — N920 Excessive and frequent menstruation with regular cycle: Secondary | ICD-10-CM | POA: Insufficient documentation

## 2016-09-18 DIAGNOSIS — Z79899 Other long term (current) drug therapy: Secondary | ICD-10-CM | POA: Diagnosis not present

## 2016-09-18 DIAGNOSIS — K5730 Diverticulosis of large intestine without perforation or abscess without bleeding: Secondary | ICD-10-CM | POA: Diagnosis not present

## 2016-09-18 DIAGNOSIS — D582 Other hemoglobinopathies: Secondary | ICD-10-CM | POA: Diagnosis not present

## 2016-09-18 DIAGNOSIS — N841 Polyp of cervix uteri: Secondary | ICD-10-CM | POA: Insufficient documentation

## 2016-09-18 MED ORDER — HEPARIN SOD (PORK) LOCK FLUSH 100 UNIT/ML IV SOLN: 250.0000 [IU] | Freq: Once | INTRAVENOUS | Status: DC | PRN

## 2016-09-18 MED ORDER — ALTEPLASE 2 MG IJ SOLR: 2.0000 mg | Freq: Once | INTRAMUSCULAR | Status: DC | PRN

## 2016-09-18 MED ORDER — SODIUM CHLORIDE 0.9 % IV SOLN
Freq: Once | INTRAVENOUS | Status: AC
  Administered 2016-09-18: 11:00:00 via INTRAVENOUS
  Filled 2016-09-18: qty 1000

## 2016-09-18 MED ORDER — FERUMOXYTOL INJECTION 510 MG/17 ML
510.0000 mg | Freq: Once | INTRAVENOUS | Status: AC
  Administered 2016-09-18: 510 mg via INTRAVENOUS
  Filled 2016-09-18: qty 17

## 2016-09-18 MED ORDER — SODIUM CHLORIDE 0.9 % IV SOLN
Freq: Once | INTRAVENOUS | Status: DC
  Filled 2016-09-18: qty 1000

## 2016-09-18 MED ORDER — SODIUM CHLORIDE 0.9 % IJ SOLN
3.0000 mL | Freq: Once | INTRAMUSCULAR | Status: DC | PRN
  Filled 2016-09-18: qty 10

## 2016-09-18 MED ORDER — SODIUM CHLORIDE 0.9 % IJ SOLN
10.0000 mL | INTRAMUSCULAR | Status: DC | PRN
  Filled 2016-09-18: qty 10

## 2016-09-18 MED ORDER — HEPARIN SOD (PORK) LOCK FLUSH 100 UNIT/ML IV SOLN: 500.0000 [IU] | Freq: Once | INTRAVENOUS | Status: DC | PRN

## 2016-09-18 NOTE — Progress Notes (Signed)
Patient states she had her physical last week.  No problems noted.  Patient offers no complaints today.

## 2016-09-19 ENCOUNTER — Encounter: Payer: Self-pay | Admitting: Hematology and Oncology

## 2016-09-21 ENCOUNTER — Other Ambulatory Visit: Payer: Self-pay | Admitting: Nurse Practitioner

## 2016-10-07 ENCOUNTER — Encounter: Payer: Self-pay | Admitting: Gastroenterology

## 2016-10-09 ENCOUNTER — Ambulatory Visit
Admission: RE | Admit: 2016-10-09 | Discharge: 2016-10-09 | Disposition: A | Discharge status: Home / Self Care | Payer: 59 | Source: Ambulatory Visit | Attending: Nurse Practitioner | Admitting: Nurse Practitioner

## 2016-10-09 DIAGNOSIS — Z1231 Encounter for screening mammogram for malignant neoplasm of breast: Secondary | ICD-10-CM | POA: Insufficient documentation

## 2016-10-24 ENCOUNTER — Encounter: Payer: Self-pay | Admitting: Hematology and Oncology

## 2016-12-31 ENCOUNTER — Encounter: Payer: Self-pay | Admitting: Urgent Care

## 2017-03-18 NOTE — Progress Notes (Signed)
[facility] day:  03/19/17  Chief Complaint: Beverly Campbell is a 52 y.o. female with iron deficiency anemia who is seen for 6 month assessment.  HPI:  The patient was last seen in the hematology clinic on 09/18/2016.  At that time, she felt good.  Exam was unremarkable. Ferritin was 11.  She received Feraheme on 09/18/2016.   During the interim, patient has been doing well. She notes that her energy level is "good". She does note that she fatigues easily with "strenous" activity. Patient denies bleeding; no hematochezia, melena, or gross hematuria. She denies chest pain and shortness of breath.   Patient has heavy menstrual cycles. She states, "They last 7 days. Some months it is so heavy that I can't go anywhere for the first 2 days. I am changing pads every hour". Patient has spoken with GYN regarding her heavy cycles. Patient states, "They have talked to me before about a hysterectomy".  LMP was last week.   Patient eating well. She has gained 4 pounds. Patient denies pain in the clinic today.    Past Medical History:  Diagnosis Date  . Anemia   . Hypothyroidism   . Thyroid disease     Past Surgical History:  Procedure Laterality Date  . COLONOSCOPY WITH PROPOFOL N/A 01/01/2016   Procedure: COLONOSCOPY WITH PROPOFOL;  Surgeon: Jonathon Bellows, MD;  Location: [facility];  Service: Endoscopy;  Laterality: N/A;  . ESOPHAGOGASTRODUODENOSCOPY (EGD) WITH PROPOFOL N/A 01/01/2016   Procedure: ESOPHAGOGASTRODUODENOSCOPY (EGD) WITH PROPOFOL;  Surgeon: Jonathon Bellows, MD;  Location: [facility];  Service: Endoscopy;  Laterality: N/A;  . THYROID SURGERY  1997   had thyroid removed  . TUBAL LIGATION  1995    Family History  Problem Relation Age of Onset  . Colon cancer Father   . Hypertension Mother   . Diabetes Mother     Social History:  reports that  has never smoked. she has never used smokeless tobacco. She reports that she  drinks alcohol. She reports that she does not use drugs.  The patient is from [place].  She came to the [place] in 1980.  She lives in [place].  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  She has 3 children (ages 21, 62, 75) who are alive and well.  No family history of anemia or thalassesmia.  The patient is alone today.  Allergies: No Known Allergies  Current Medications: Current Outpatient Medications  Medication Sig Dispense Refill  . levothyroxine (SYNTHROID, LEVOTHROID) 100 MCG tablet Take 100 mcg by mouth daily before breakfast.    . Multiple Vitamins-Iron (MULTIVITAMIN/IRON PO) Take by mouth.     No current facility-administered medications for this visit.     Review of Systems:  GENERAL:  Feels "pretty good".  Fatigue with exercise.  No fevers or sweats.  Weight up 4 pounds PERFORMANCE STATUS (ECOG):  0 HEENT:  No visual changes, runny nose, sore throat, mouth sores or tenderness.   Lungs:  No shortness of breath or cough.  No hemoptysis. Cardiac:  No chest pain, palpitations, orthopnea, or PND. GI:  Diet is good.  No nausea, vomiting, diarrhea, constipation, melena or hematochezia. GU:  No urgency, frequency, dysuria, or hematuria.  Menses started today.  Musculoskeletal:  No back pain.  No joint pain.  No muscle tenderness. Extremities:  No pain or swelling. Skin:  No rashes or skin changes. Neuro:  No headache.  numbness or weakness, balance or coordination issues.  Endocrine:  No diabetes.  Thyroid disease on Synthroid.  No  hot flashes or night sweats. Psych:  No mood changes, depression or anxiety. Pain:  No focal pain. Review of systems:  All other systems reviewed and found to be negative.  Physical Exam: Blood pressure 114/74, pulse 73, temperature (!) 97.5 F (36.4 C), temperature source Tympanic, resp. rate 18, weight 175 lb 0.7 oz (79.4 kg), SpO2 98 %. GENERAL:  Well developed, well nourished, woman sitting comfortably in the exam room in no acute  distress. MENTAL STATUS:  Alert and oriented to person, place and time. HEAD:  Long brown hair.  Normocephalic, atraumatic, face symmetric, no Cushingoid features. EYES:  Glasses.  Brown eyes.  Pupils equal round and reactive to light and accomodation.  No conjunctivitis or scleral icterus. ENT:  Oropharynx clear without lesion.  Tongue normal. Mucous membranes moist.  RESPIRATORY:  Clear to auscultation without rales, wheezes or rhonchi. CARDIOVASCULAR:  Regular rate and rhythm without murmur, rub or gallop. ABDOMEN:  Soft, non-tender, with active bowel sounds, and no hepatosplenomegaly.  No masses. SKIN:  Tan.  No rashes, ulcers or lesions. EXTREMITIES: No edema, no skin discoloration or tenderness.  No palpable cords. LYMPH NODES: No palpable cervical, supraclavicular, axillary or inguinal adenopathy  NEUROLOGICAL: Unremarkable. PSYCH:  Appropriate.   No visits with results within 3 Day(s) from this visit.  Latest known visit with results is:  Office Visit on 10/25/2014  Component Date Value Ref Range Status  . Preg Test, Ur 10/25/2014 Negative  Negative Final  . PATH REPORT.SITE OF ORIGIN [facility] 10/27/2014 Comment   Final   Comment: Material submitted:                                        Marland Kitchen EMDOMETRIUM, BIOPSY   . Marland Kitchen 10/27/2014 Comment   Final   Comment: Clinician provided ICD-10: N93.9 N92.1   . PATH REPORT.FINAL DX [facility] 10/27/2014 Comment   Final   Comment:  Diagnosis: EMDOMETRIUM, BIOPSY: SECRETORY ENDOMETRIUM. NO HYPERPLASIA OR CARCINOMA. SMALL BENIGN ENDOCERVICAL POLYP. BUL/10/27/2014    . SIGNED OUT BY: 10/27/2014 Comment   Final   Comment: Electronically signed:                                     . Hoy Finlay, MD, Pathologist   . GROSS DESCRIPTION: 10/27/2014 Comment   Final   Comment: Johney Maine description:                                         . 1 Container, formalin-filled, labeled with patient identification. EMDOMETRIUM, BIOPSY: Received in formalin is  1.5 X 3 X .3 cm in aggregate of tan and brown material.  Tissue is submitted in toto in 1 cassette(s). /SBB /SBB   . Marland Kitchen 10/27/2014 Comment   Final   Comment: Pathologist provided ICD-10: N84.1   . PAYMENT PROCEDURE 10/27/2014 Comment   Final   Comment: CPT                                                        .  [id number]    [facility] Labs: 08/24/2015: hematocrit 30.2, hemoglobin 11.9, and MCV 58.  Ferritin was 8 with an iron saturation of 8% and a TIBC of 365. 10/12/2015: hematocrit 33.5, hemoglobin 10.5, and MCV 60.  Ferritin was 98. 03/01/2016: hematocrit 25.1, hemoglobin 7.7, MCV 55, platelets 310,000, WBC 4800.  Ferritin was 6. Iron saturation was 5% with a TIBC of 386.  CMP was normal.   04/17/2016: hematocrit 27.6, hemoglobin 8.0, MCV 54, platelets 282,000, WBC 4000 with an ANC of 1700. Ferritin was 5.  Iron saturation was 6% with a TIBC of 386. 06/03/2016: hematocrit 33.6, hemoglobin 10.6, MCV 61, platelets 192,000, WBC 3800 with an ANC of 1700. Ferritin was 77.  Iron saturation 44% with a TIBC of 277. 09/11/2016: hematocrit 32.7, hemoglobin 10.8, MCV 60, platelets 204,000, WBC 4800 with an ANC of 2700. Ferritin was 11.  Iron saturation 17% with a TIBC of 337. B12 was 414. Folate >20. CMP, lipid panel, and thyroid panel were normal. Vitamin D was 21.3.  12/20/2016: hematocrit 36.9, hemoglobin 12.0, MCV 65, platelets 226,000, WBC 4500 with an ANC of 2500.  Ferritin was 45. 03/17/2017: hematocrit 33.4, hemoglobin 10.2, MCV 62, platelets 296,000, WBC 4400 with an ANC of 2300.  Ferritin was 9.   Assessment:  Beverly Campbell is a 52 y.o. female from [place] with a long standing history of anemia.  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  Oral iron causes diarrhea.  EGD on 01/01/2016 was normal.  Duodenal biopsy was negative   Colonoscopy on 01/01/2016 revealed diverticulosis in the entire colon and non-bleeding internal  hemorrhoids.  She denies any melena or hematochezia.  Diet is good.  She has a history of of ice pica.  Normal labs on 08/24/2015 and 09/11/2016 included:  B12, folate, and TSH.   She received Feraheme 1020 mg on 04/05/2013.  Ferritin improved from 22 to 273.  Hematocrit increased to 32.3.  Microcytic indices persisted.  She received Feraheme 510 mg on 09/20/2015, 04/17/2016, and 04/23/2016.   Ferritin has been followed: 8 on 08/24/2015, 98 on 10/12/2015, 6 on 03/01/2016, 5 on 04/17/2016, 77 on 06/03/2016, 11 on 09/11/2016, and 45 on 12/20/2016.  Symptomatically, she feels good. She is having heavy menstrual cycles. Exam is unremarkable.  Iron stores are low at 9.  Plan: 1.  Review [facility] labs from 03/17/2017. 2.  Ferritin low at 9.  Ferritin goal is > 100.  Will proceed with Feraheme 510 mg today.  3.  Discuss  irregular menses that are initally heavy every month. Recurrent iron deficiency anemia may potentially be related to heavy menses, but other causes cannot be excluded without further workup. Add PT, von Willebrand panel, and platelet function assay to next labs to r/o bleeding diathesis. 4.  [facility] slips prior to next appointment: 3 months (CBC with diff, ferritin, PT, PFA, von Willebrand panel) and 6 months (CBC with diff, CMP, ferritin). 5.  RTC in 6 months for MD assessment, review of labs and +/- Feraheme.   Honor Loh, NP  03/19/2017, 10:23 AM   I saw and evaluated the patient, participating in the key portions of the service and reviewing pertinent diagnostic studies and records.  I reviewed the nurse practitioner's note and agree with the findings and the plan.  The assessment and plan were discussed with the patient.  A few questions were asked by the patient and answered.   Lequita Asal, MD 03/19/2017, 10:23 AM

## 2017-03-19 ENCOUNTER — Inpatient Hospital Stay: Payer: Managed Care, Other (non HMO) | Attending: Hematology and Oncology | Admitting: Hematology and Oncology

## 2017-03-19 ENCOUNTER — Inpatient Hospital Stay: Payer: Managed Care, Other (non HMO)

## 2017-03-19 ENCOUNTER — Encounter: Payer: Self-pay | Admitting: Hematology and Oncology

## 2017-03-19 VITALS — BP 116/78 | HR 83 | Temp 96.8°F | Resp 18

## 2017-03-19 VITALS — BP 114/74 | HR 73 | Temp 97.5°F | Resp 18 | Wt 175.0 lb

## 2017-03-19 DIAGNOSIS — D582 Other hemoglobinopathies: Secondary | ICD-10-CM | POA: Diagnosis not present

## 2017-03-19 DIAGNOSIS — N920 Excessive and frequent menstruation with regular cycle: Secondary | ICD-10-CM

## 2017-03-19 DIAGNOSIS — K5730 Diverticulosis of large intestine without perforation or abscess without bleeding: Secondary | ICD-10-CM | POA: Insufficient documentation

## 2017-03-19 DIAGNOSIS — E039 Hypothyroidism, unspecified: Secondary | ICD-10-CM | POA: Diagnosis not present

## 2017-03-19 DIAGNOSIS — Z90710 Acquired absence of both cervix and uterus: Secondary | ICD-10-CM | POA: Insufficient documentation

## 2017-03-19 DIAGNOSIS — D509 Iron deficiency anemia, unspecified: Secondary | ICD-10-CM

## 2017-03-19 DIAGNOSIS — D500 Iron deficiency anemia secondary to blood loss (chronic): Secondary | ICD-10-CM | POA: Diagnosis present

## 2017-03-19 DIAGNOSIS — Z79899 Other long term (current) drug therapy: Secondary | ICD-10-CM | POA: Diagnosis not present

## 2017-03-19 DIAGNOSIS — K648 Other hemorrhoids: Secondary | ICD-10-CM | POA: Diagnosis not present

## 2017-03-19 DIAGNOSIS — N841 Polyp of cervix uteri: Secondary | ICD-10-CM | POA: Diagnosis not present

## 2017-03-19 MED ORDER — SODIUM CHLORIDE 0.9 % IV SOLN
Freq: Once | INTRAVENOUS | Status: AC
  Administered 2017-03-19: 11:00:00 via INTRAVENOUS
  Filled 2017-03-19: qty 1000

## 2017-03-19 MED ORDER — SODIUM CHLORIDE 0.9 % IV SOLN
510.0000 mg | Freq: Once | INTRAVENOUS | Status: AC
  Administered 2017-03-19: 510 mg via INTRAVENOUS
  Filled 2017-03-19: qty 17

## 2017-03-19 NOTE — Progress Notes (Signed)
Patient offers no complaints today. 

## 2017-03-19 NOTE — Patient Instructions (Signed)

## 2017-03-20 ENCOUNTER — Encounter: Payer: Self-pay | Admitting: Hematology and Oncology

## 2017-04-29 ENCOUNTER — Other Ambulatory Visit: Payer: Self-pay | Admitting: Internal Medicine

## 2017-07-16 ENCOUNTER — Ambulatory Visit: Payer: Managed Care, Other (non HMO) | Admitting: Hematology and Oncology

## 2017-07-16 ENCOUNTER — Ambulatory Visit: Payer: Managed Care, Other (non HMO)

## 2017-08-19 NOTE — Progress Notes (Signed)
[facility] day:  08/20/17  Chief Complaint: Beverly Campbell is a 52 y.o. female with iron deficiency anemia who is seen for 6 month assessment.  HPI:  The patient was last seen in the hematology clinic on 03/19/2017.  At that time, patient was doing well.  Energy level activity had improved, however she noted that she easily fatigued with "strenuous".  Patient with heavy menstrual bleeding lasting 7 days.  Patient advised that she was changing her pads every hour.  She had been seen in consult by GYN and a hysterectomy had been recommended.  Exam was unremarkable.  [facility] labs reviewed.  Hemoglobin 10.2, hematocrit 33.4, MCV 62.0, and platelets 296.  Ferritin was low at 9.  She received Feraheme 510 mg IV.  [facility]  labs from 08/18/2017 reviewed.  WBC 4000 with an [place] of 2000.  Hemoglobin 11.4, hematocrit 36.5, MCV 63.0, and platelets 286,000.  INR was 1.0. Ferritin low at 24.  Von Willebrand's panel is pending.   In the interim, patient has been increasingly fatigued over the last month. Patient states, "I am sleepy all of the time".  She recently changed her work schedule (1800-0230). She has exertional shortness of breath with minimal activity. She denies episodes of chest pain. She denies ice pica and restless legs symptoms. Patient denies bleeding; no hematochezia, melena, or gross hematuria. She continues to have heavy menstrual bleeding. She had 2 menses in 07/2017.  Last IV iron dose was in 02/2017, she notes that she was not able to appreciate much difference following the infusion.   Patient denies that she has experienced any B symptoms. She denies any interval infections. Patient advises that she maintains an adequate appetite. She is eating well. Weight today is 174 lb 6.1 oz (79.1 kg), which compared to her last visit to the clinic, represents a  1 pound decrease.   Patient denies pain in the clinic today.   Past Medical  History:  Diagnosis Date  . Anemia   . Hypothyroidism   . Thyroid disease     Past Surgical History:  Procedure Laterality Date  . COLONOSCOPY WITH PROPOFOL N/A 01/01/2016   Procedure: COLONOSCOPY WITH PROPOFOL;  Surgeon: Jonathon Bellows, MD;  Location: [facility] ENDOSCOPY;  Service: Endoscopy;  Laterality: N/A;  . ESOPHAGOGASTRODUODENOSCOPY (EGD) WITH PROPOFOL N/A 01/01/2016   Procedure: ESOPHAGOGASTRODUODENOSCOPY (EGD) WITH PROPOFOL;  Surgeon: Jonathon Bellows, MD;  Location: [facility] ENDOSCOPY;  Service: Endoscopy;  Laterality: N/A;  . THYROID SURGERY  1997   had thyroid removed  . TUBAL LIGATION  1995    Family History  Problem Relation Age of Onset  . Colon cancer Father   . Hypertension Mother   . Diabetes Mother     Social History:  reports that she has never smoked. She has never used smokeless tobacco. She reports that she drinks alcohol. She reports that she does not use drugs.  The patient is from [place].  She came to the [place] in 1980.  She lives in [place].  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  She has 3 children (ages 78, 56, 31) who are alive and well.  No family history of anemia or thalassesmia.  The patient is alone today.  Allergies: No Known Allergies  Current Medications: Current Outpatient Medications  Medication Sig Dispense Refill  . levothyroxine (SYNTHROID, LEVOTHROID) 100 MCG tablet TAKE 1 TABLET BY MOUTH  DAILY 90 tablet 2  . Multiple Vitamins-Iron (MULTIVITAMIN/IRON PO) Take by  mouth.     No current facility-administered medications for this visit.     Review of Systems  Constitutional: Positive for malaise/fatigue and weight loss (down 1 pound). Negative for diaphoresis and fever.       "I feel tired and sleepy all of the time".   HENT: Negative.   Eyes: Negative.   Respiratory: Negative for cough, hemoptysis, sputum production and shortness of breath.   Cardiovascular: Negative for chest pain, palpitations, orthopnea, leg swelling and PND.   Gastrointestinal: Negative for abdominal pain, blood in stool, constipation, diarrhea, melena, nausea and vomiting.  Genitourinary: Negative for dysuria, frequency, hematuria and urgency.       Heavy menses  Musculoskeletal: Negative for back pain, falls, joint pain and myalgias.  Skin: Negative for itching and rash.  Neurological: Negative for dizziness, tremors, weakness and headaches.  Endo/Heme/Allergies: Does not bruise/bleed easily.       HYPOthyroidism on levothyroxine  Psychiatric/Behavioral: Negative for depression, memory loss and suicidal ideas. The patient is not nervous/anxious and does not have insomnia.   All other systems reviewed and are negative.  Performance status (ECOG): 0 - Asymptomatic  Vital Signs BP 116/82 (BP Location: Left Arm, Patient Position: Sitting)   Pulse 67   Temp 98.9 F (37.2 C) (Tympanic)   Resp 18   Wt 174 lb 6.1 oz (79.1 kg)   SpO2 100%   BMI 32.95 kg/m   Physical Exam  Constitutional: She is oriented to person, place, and time and well-developed, well-nourished, and in no distress.  HENT:  Head: Normocephalic and atraumatic.  Long dark hair  Eyes: Pupils are equal, round, and reactive to light. EOM are normal. No scleral icterus.  Glasses.  Brown eyes  Neck: Normal range of motion. Neck supple. No tracheal deviation present. No thyromegaly present.  Cardiovascular: Normal rate, regular rhythm and normal heart sounds. Exam reveals no gallop and no friction rub.  No murmur heard. Pulmonary/Chest: Effort normal and breath sounds normal. No respiratory distress. She has no wheezes. She has no rales.  Abdominal: Soft. Bowel sounds are normal. She exhibits no distension. There is no tenderness.  Musculoskeletal: Normal range of motion. She exhibits no edema or tenderness.  Neurological: She is alert and oriented to person, place, and time.  Skin: Skin is warm and dry. No rash noted. No erythema.  Psychiatric: Mood, affect and judgment  normal.  Nursing note and vitals reviewed.   Appointment on 04/22/2016  Component Date Value Ref Range Status  . Preg Test, Ur 04/22/2016 NEGATIVE  NEGATIVE Final    [facility] Labs: 08/24/2015: hematocrit 30.2, hemoglobin 11.9, and MCV 58.  Ferritin was 8 with an iron saturation of 8% and a TIBC of 365. 10/12/2015: hematocrit 33.5, hemoglobin 10.5, and MCV 60.  Ferritin was 98. 03/01/2016: hematocrit 25.1, hemoglobin 7.7, MCV 55, platelets 310,000, WBC 4800.  Ferritin was 6. Iron saturation was 5% with a TIBC of 386.  CMP was normal.   04/17/2016: hematocrit 27.6, hemoglobin 8.0, MCV 54, platelets 282,000, WBC 4000 with an ANC of 1700. Ferritin was 5.  Iron saturation was 6% with a TIBC of 386. 06/03/2016: hematocrit 33.6, hemoglobin 10.6, MCV 61, platelets 192,000, WBC 3800 with an ANC of 1700. Ferritin was 77.  Iron saturation 44% with a TIBC of 277. 09/11/2016: hematocrit 32.7, hemoglobin 10.8, MCV 60, platelets 204,000, WBC 4800 with an ANC of 2700. Ferritin was 11.  Iron saturation 17% with a TIBC of 337. B12 was 414. Folate >20. CMP, lipid panel, and  thyroid panel were normal. Vitamin D was 21.3.  12/20/2016: hematocrit 36.9, hemoglobin 12.0, MCV 65, platelets 226,000, WBC 4500 with an ANC of 2500.  Ferritin was 45. 03/17/2017: hematocrit 33.4, hemoglobin 10.2, MCV 62, platelets 296,000, WBC 4400 with an ANC of 2300.  Ferritin was 9. 08/18/2017: hematocrit 36.5, hemoglobin 11.4, MCV 63, platelets 286,000, WBC 4000 with an ANC of 2000.  Ferritin was 24.  INR 1.0.  Von Willebrand's panel pending.   Assessment:  Denaly Ludden is a 52 y.o. female from [place] with a long standing history of anemia.  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  Oral iron causes diarrhea.  EGD on 01/01/2016 was normal.  Duodenal biopsy was negative   Colonoscopy on 01/01/2016 revealed diverticulosis in the entire colon and non-bleeding internal  hemorrhoids.  She denies any melena or hematochezia.  Diet is good.  She has a history of of ice pica.  Normal labs on 08/24/2015 and 09/11/2016 included:  B12, folate, and TSH.  von Willebrand panel is pending.  She received Feraheme 1020 mg on 04/05/2013.  Ferritin improved from 22 to 273.  Hematocrit increased to 32.3.  Microcytic indices persisted.  She received Feraheme 510 mg on 09/20/2015, 04/17/2016,  04/23/2016, and 03/19/2017.  Ferritin has been followed: 8 on 08/24/2015, 98 on 10/12/2015, 6 on 03/01/2016, 5 on 04/17/2016, 77 on 06/03/2016, 11 on 09/11/2016,  45 on 12/20/2016, 9 on 03/17/2017, 24 on 08/18/2017.  Symptomatically, patient is fatigued. She is sleepy all of the time. She continues with heavy menstrual bleeding. She has no other complaints. Exam unremarkable.   Plan: 1. Review [facility] labs from 08/18/2017.  INR was normal.  Await von Willebrand testing as patient continues to require iron secondary to heavy menses. 2. Microcytic anemia (iron deficiency anemia)   Hemoglobin 11.4, hematocrit 36.5, MCV 63 (improved).  Discuss iron stores. Ferritin low at 24.  Ferritin goal is > 100 for this patient. She last received intravenous iron replacement on 03/19/2017.  Will proceed with Feraheme 510 mg IV today. 3. Heavy menses - ongoing  Discuss irregular menses that are initially heavy every month.  Discuss that recurrent IDA may be related to heavy menses.  Discuss need for follow-up with GYN for  further discussion regarding recommended hysterectomy. 4. Provide lab slips for [facility] labs in 3 months and prior to next appointment (CBC with differential, ferritin). 5. RTC in 6 months for MD assessment, review of [facility] labs, and +/- additional Feraheme.    Honor Loh, NP  08/20/2017, 2:24 PM   I saw and evaluated the patient, participating in the key portions of the service and reviewing pertinent diagnostic studies and records.  I reviewed the nurse practitioner's note  and agree with the findings and the plan.  The assessment and plan were discussed with the patient.  A few questions were asked by the patient and answered.   Lequita Asal, MD 08/20/2017, 2:24 PM

## 2017-08-20 ENCOUNTER — Inpatient Hospital Stay: Payer: Managed Care, Other (non HMO)

## 2017-08-20 ENCOUNTER — Inpatient Hospital Stay: Payer: Managed Care, Other (non HMO) | Attending: Hematology and Oncology | Admitting: Hematology and Oncology

## 2017-08-20 ENCOUNTER — Telehealth: Payer: Self-pay | Admitting: *Deleted

## 2017-08-20 VITALS — BP 120/76 | HR 63 | Temp 97.4°F | Resp 18

## 2017-08-20 VITALS — BP 116/82 | HR 67 | Temp 98.9°F | Resp 18 | Wt 174.4 lb

## 2017-08-20 DIAGNOSIS — D500 Iron deficiency anemia secondary to blood loss (chronic): Secondary | ICD-10-CM | POA: Diagnosis not present

## 2017-08-20 DIAGNOSIS — D509 Iron deficiency anemia, unspecified: Secondary | ICD-10-CM

## 2017-08-20 DIAGNOSIS — N924 Excessive bleeding in the premenopausal period: Secondary | ICD-10-CM

## 2017-08-20 DIAGNOSIS — K5730 Diverticulosis of large intestine without perforation or abscess without bleeding: Secondary | ICD-10-CM | POA: Diagnosis not present

## 2017-08-20 DIAGNOSIS — Z90710 Acquired absence of both cervix and uterus: Secondary | ICD-10-CM | POA: Insufficient documentation

## 2017-08-20 DIAGNOSIS — Z79899 Other long term (current) drug therapy: Secondary | ICD-10-CM | POA: Diagnosis not present

## 2017-08-20 DIAGNOSIS — E039 Hypothyroidism, unspecified: Secondary | ICD-10-CM | POA: Diagnosis not present

## 2017-08-20 DIAGNOSIS — K648 Other hemorrhoids: Secondary | ICD-10-CM | POA: Diagnosis not present

## 2017-08-20 DIAGNOSIS — D582 Other hemoglobinopathies: Secondary | ICD-10-CM | POA: Diagnosis not present

## 2017-08-20 DIAGNOSIS — N841 Polyp of cervix uteri: Secondary | ICD-10-CM | POA: Diagnosis not present

## 2017-08-20 DIAGNOSIS — N920 Excessive and frequent menstruation with regular cycle: Secondary | ICD-10-CM | POA: Diagnosis not present

## 2017-08-20 MED ORDER — SODIUM CHLORIDE 0.9 % IV SOLN
510.0000 mg | Freq: Once | INTRAVENOUS | Status: AC
  Administered 2017-08-20: 510 mg via INTRAVENOUS
  Filled 2017-08-20: qty 17

## 2017-08-20 MED ORDER — SODIUM CHLORIDE 0.9 % IV SOLN
Freq: Once | INTRAVENOUS | Status: AC
  Administered 2017-08-20: 15:00:00 via INTRAVENOUS
  Filled 2017-08-20: qty 1000

## 2017-08-20 NOTE — Progress Notes (Signed)
Patient states the past month her menstrual cycle was off and she was very fatigued and SOB.  States she is feeling better now. Patient states she has had a cough for over a month.  States her throat feels sore deep down into her throat.  When she talks or takes a deep breath it irritates it and she coughs.

## 2017-08-20 NOTE — Patient Instructions (Signed)

## 2017-08-20 NOTE — Telephone Encounter (Signed)
Called dentist office to request dental clearance letter so patient can start Prolia injections.

## 2017-09-22 ENCOUNTER — Encounter: Payer: Self-pay | Admitting: Hematology and Oncology

## 2017-10-09 ENCOUNTER — Encounter: Payer: Self-pay | Admitting: Nurse Practitioner

## 2017-10-09 ENCOUNTER — Ambulatory Visit (INDEPENDENT_AMBULATORY_CARE_PROVIDER_SITE_OTHER): Payer: Managed Care, Other (non HMO) | Admitting: Nurse Practitioner

## 2017-10-09 VITALS — BP 120/82 | HR 68 | Resp 16 | Ht 62.0 in | Wt 178.2 lb

## 2017-10-09 DIAGNOSIS — Z1231 Encounter for screening mammogram for malignant neoplasm of breast: Secondary | ICD-10-CM

## 2017-10-09 DIAGNOSIS — Z0001 Encounter for general adult medical examination with abnormal findings: Secondary | ICD-10-CM | POA: Diagnosis not present

## 2017-10-09 DIAGNOSIS — E039 Hypothyroidism, unspecified: Secondary | ICD-10-CM | POA: Diagnosis not present

## 2017-10-09 DIAGNOSIS — E559 Vitamin D deficiency, unspecified: Secondary | ICD-10-CM

## 2017-10-09 DIAGNOSIS — R300 Dysuria: Secondary | ICD-10-CM

## 2017-10-09 DIAGNOSIS — M67449 Ganglion, unspecified hand: Secondary | ICD-10-CM

## 2017-10-09 DIAGNOSIS — N390 Urinary tract infection, site not specified: Secondary | ICD-10-CM

## 2017-10-09 DIAGNOSIS — Z1239 Encounter for other screening for malignant neoplasm of breast: Secondary | ICD-10-CM

## 2017-10-09 NOTE — Progress Notes (Signed)
[facility] [place], [place] [address]  Internal MEDICINE  Office Visit Note  Patient Name: Beverly Campbell  [id number]  [id number]  Date of Service: 10/19/2017   Pt is here for routine health maintenance examination  Chief Complaint  Patient presents with  . Annual Exam  . Menstrual Problem    irregular cycyles      The patient has long history of heavy menstrual cycles and fibroid tumors. Had very heavy and long cycle in July. Has not had a cycle since then. Did have low back pain, bloating and cramping, but no bleeding. She has noted increased fatigue and weight creeping up. Also having trouble maintaining sleep. History of hypothyroid. Time to have thyroid panel checked.  Ganglion type lesion at the base of the ring finger of right hand. Has been there for several years. Did see orthopedic at one point. Wanted to put her under general anesthesia to remove it and she opted not to do this. Getting irritated. 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 Causes irritation to the ligaments/tenderness to the hand at the end of the day..     Current Medication: Outpatient Encounter Medications as of 10/09/2017  Medication Sig  . levothyroxine (SYNTHROID, LEVOTHROID) 100 MCG tablet TAKE 1 TABLET BY MOUTH  DAILY  . Multiple Vitamins-Iron (MULTIVITAMIN/IRON PO) Take by mouth.   No facility-administered encounter medications on file as of 10/09/2017.     Surgical History: Past Surgical History:  Procedure Laterality Date  . COLONOSCOPY WITH PROPOFOL N/A 01/01/2016   Procedure: COLONOSCOPY WITH PROPOFOL;  Surgeon: Jonathon Bellows, MD;  Location: [facility];  Service: Endoscopy;  Laterality: N/A;  . ESOPHAGOGASTRODUODENOSCOPY (EGD) WITH PROPOFOL N/A 01/01/2016   Procedure: ESOPHAGOGASTRODUODENOSCOPY (EGD) WITH PROPOFOL;  Surgeon: Jonathon Bellows, MD;  Location: [facility];  Service: Endoscopy;  Laterality: N/A;    . THYROID SURGERY  1997   had thyroid removed  . TUBAL LIGATION  1995    Medical History: Past Medical History:  Diagnosis Date  . Anemia   . Hypothyroidism   . Thyroid disease     Family History: Family History  Problem Relation Age of Onset  . Colon cancer Father   . Hypertension Mother   . Diabetes Mother       Review of Systems  Constitutional: Negative for activity change, chills, fatigue and unexpected weight change.  HENT: Negative for congestion, postnasal drip, rhinorrhea, sneezing and sore throat.   Eyes: Negative.  Negative for redness.  Respiratory: Negative for cough, chest tightness, shortness of breath and wheezing.   Cardiovascular: Negative for chest pain and palpitations.  Gastrointestinal: Negative for abdominal pain, constipation, diarrhea, nausea and vomiting.  Endocrine: Negative for cold intolerance, heat intolerance, polydipsia, polyphagia and polyuria.  Genitourinary: Positive for menstrual problem. Negative for dysuria and frequency.  Musculoskeletal: Positive for arthralgias. Negative for back pain, joint swelling and neck pain.  Skin: Negative for rash.  Allergic/Immunologic: Negative for environmental allergies and food allergies.  Neurological: Negative for dizziness, tremors, numbness and headaches.  Hematological: Negative for adenopathy. Does not bruise/bleed easily.  Psychiatric/Behavioral: Negative for behavioral problems (Depression), sleep disturbance and suicidal ideas. The patient is not nervous/anxious.      Today's Vitals   10/09/17 1457  BP: 120/82  Pulse: 68  Resp: 16  SpO2: 97%  Weight: 178 lb 3.2 oz (80.8 kg)  Height: 5\' 2"  (1.575 m)     Physical Exam  Constitutional: She is  oriented to person, place, and time. She appears well-developed and well-nourished. No distress.  HENT:  Head: Normocephalic and atraumatic.  Nose: Nose normal.  Mouth/Throat: Oropharynx is clear and moist. No oropharyngeal exudate.  Eyes:  Pupils are equal, round, and reactive to light. Conjunctivae and EOM are normal.  Neck: Normal range of motion. Neck supple. No JVD present. No tracheal deviation present. No thyromegaly present.  Cardiovascular: Normal rate, regular rhythm and normal heart sounds. Exam reveals no gallop and no friction rub.  No murmur heard. Pulmonary/Chest: Effort normal and breath sounds normal. No respiratory distress. She has no wheezes. She has no rales. She exhibits no tenderness. Right breast exhibits no inverted nipple, no mass, no nipple discharge, no skin change and no tenderness. Left breast exhibits no inverted nipple, no mass, no nipple discharge, no skin change and no tenderness.  Abdominal: Soft. Bowel sounds are normal. There is no tenderness.  Musculoskeletal: Normal range of motion.       Arms: Lymphadenopathy:    She has no cervical adenopathy.  Neurological: She is alert and oriented to person, place, and time. No cranial nerve deficit.  Skin: Skin is warm and dry. Capillary refill takes less than 2 seconds. She is not diaphoretic.  Psychiatric: She has a normal mood and affect. Her behavior is normal. Judgment and thought content normal.  Nursing note and vitals reviewed.    LABS: Recent Results (from the past 2160 hour(s))  Specimen status report     Status: None   Collection Time: 10/09/17 12:00 AM  Result Value Ref Range   specimen status report Comment     Comment: Please note Please note The date and/or time of collection was not indicated on the requisition as required by state and federal law.  The date of receipt of the specimen was used as the collection date if not supplied.   Urine Culture     Status: Abnormal   Collection Time: 10/09/17 12:00 AM  Result Value Ref Range   Urine Culture, Routine Final report (A)    Organism ID, Bacteria Comment (A)     Comment: Beta hemolytic Streptococcus, group B Greater than 100,000 colony forming units per mL Penicillin and  ampicillin are drugs of choice for treatment of beta-hemolytic streptococcal infections. Susceptibility testing of penicillins and other beta-lactam agents approved by the FDA for treatment of beta-hemolytic streptococcal infections need not be performed routinely because nonsusceptible isolates are extremely rare in any beta-hemolytic streptococcus and have not been reported for Streptococcus pyogenes (group A). (CLSI)   Specimen status report     Status: None   Collection Time: 10/09/17 12:00 AM  Result Value Ref Range   specimen status report Comment     Comment: Written Authorization Written Authorization Written Authorization Received. Authorization received from [place] 10-11-2017 Logged by Lenice Llamas     Assessment/Plan: 1. Encounter for general adult medical examination with abnormal findings Annual health maintenance exam today. Routine, fasting labs ordered. - CBC with Differential/Platelet - Comprehensive metabolic panel  2. Ganglion cyst of finger Refer to orthopedics for further evaluation and possible treatment - Ambulatory referral to Orthopedic Surgery  3. Acquired hypothyroidism - T4, free - TSH  4. Vitamin D deficiency - Vitamin D 1,25 dihydroxy  5. Screening for breast cancer - MM DIGITAL SCREENING BILATERAL; Future  6. Dysuria - UA/M w/rflx Culture, Routine  General Counseling: Isidora verbalizes understanding of the findings of todays visit and agrees with plan of treatment. I have discussed any  further diagnostic evaluation that may be needed or ordered today. We also reviewed her medications today. she has been encouraged to call the office with any questions or concerns that should arise related to todays visit.    Counseling:  This patient was seen by Leretha Pol FNP Collaboration with Dr Lavera Guise as a part of collaborative care agreement  Orders Placed This Encounter  Procedures  . Urine Culture  . MM DIGITAL  SCREENING BILATERAL  . UA/M w/rflx Culture, Routine  . CBC with Differential/Platelet  . Comprehensive metabolic panel  . T4, free  . TSH  . Vitamin D 1,25 dihydroxy  . Specimen status report  . Specimen status report  . Ambulatory referral to Orthopedic Surgery    Time spent: [place], MD  Internal Medicine

## 2017-10-10 LAB — SPECIMEN STATUS REPORT

## 2017-10-10 NOTE — Progress Notes (Signed)
FYI

## 2017-10-14 LAB — SPECIMEN STATUS REPORT

## 2017-10-14 LAB — URINE CULTURE

## 2017-10-19 DIAGNOSIS — E039 Hypothyroidism, unspecified: Secondary | ICD-10-CM | POA: Insufficient documentation

## 2017-10-19 DIAGNOSIS — R300 Dysuria: Secondary | ICD-10-CM | POA: Insufficient documentation

## 2017-10-19 DIAGNOSIS — E559 Vitamin D deficiency, unspecified: Secondary | ICD-10-CM | POA: Insufficient documentation

## 2017-10-19 DIAGNOSIS — M67449 Ganglion, unspecified hand: Secondary | ICD-10-CM | POA: Insufficient documentation

## 2017-10-19 DIAGNOSIS — Z1239 Encounter for other screening for malignant neoplasm of breast: Secondary | ICD-10-CM | POA: Insufficient documentation

## 2017-10-19 MED ORDER — AMOXICILLIN-POT CLAVULANATE 875-125 MG PO TABS: 1.0000 | ORAL_TABLET | Freq: Two times a day (BID) | ORAL | 0 refills | Status: DC

## 2017-10-20 ENCOUNTER — Telehealth: Payer: Self-pay

## 2017-10-20 NOTE — Telephone Encounter (Signed)
-----   Message from Ronnell Freshwater, NP sent at 10/19/2017 12:06 PM EDT ----- Please let the patient know that urine sample take at her physical did indicate evidence of infection. I have added prescription for augmentin which is twice daily for 7 days. Sent this to [facility]. Thanks

## 2017-10-20 NOTE — Telephone Encounter (Signed)
Left message for pt to call back for her test results from last visit

## 2017-10-21 ENCOUNTER — Telehealth: Payer: Self-pay | Admitting: Nurse Practitioner

## 2017-10-21 NOTE — Telephone Encounter (Signed)
Patient was advised on results from physical and aware of antibiotic at pharmacy

## 2017-10-28 ENCOUNTER — Telehealth: Payer: Self-pay | Admitting: Nurse Practitioner

## 2017-10-30 ENCOUNTER — Other Ambulatory Visit: Payer: Self-pay | Admitting: Nurse Practitioner

## 2017-10-31 LAB — COMPREHENSIVE METABOLIC PANEL WITH GFR
ALT: 17 IU/L (ref 0–32)
AST: 17 IU/L (ref 0–40)
Albumin/Globulin Ratio: 2 (ref 1.2–2.2)
Albumin: 4.4 g/dL (ref 3.5–5.5)
Alkaline Phosphatase: 50 IU/L (ref 39–117)
BUN/Creatinine Ratio: 15 (ref 9–23)
BUN: 13 mg/dL (ref 6–24)
Bilirubin Total: 0.8 mg/dL (ref 0.0–1.2)
CO2: 25 mmol/L (ref 20–29)
Calcium: 9.3 mg/dL (ref 8.7–10.2)
Chloride: 103 mmol/L (ref 96–106)
Creatinine, Ser: 0.88 mg/dL (ref 0.57–1.00)
GFR calc Af Amer: 87 mL/min/1.73
GFR calc non Af Amer: 76 mL/min/1.73
Globulin, Total: 2.2 g/dL (ref 1.5–4.5)
Glucose: 91 mg/dL (ref 65–99)
Potassium: 4.4 mmol/L (ref 3.5–5.2)
Sodium: 140 mmol/L (ref 134–144)
Total Protein: 6.6 g/dL (ref 6.0–8.5)

## 2017-10-31 LAB — T4, FREE: Free T4: 1.83 ng/dL — ABNORMAL HIGH (ref 0.82–1.77)

## 2017-10-31 LAB — CBC
Hematocrit: 34.4 % (ref 34.0–46.6)
Hemoglobin: 11.1 g/dL (ref 11.1–15.9)
MCH: 20.1 pg — AB (ref 26.6–33.0)
MCHC: 32.3 g/dL (ref 31.5–35.7)
MCV: 62 fL — ABNORMAL LOW (ref 79–97)
Platelets: 201 10*3/uL (ref 150–450)
RBC: 5.53 x10E6/uL — AB (ref 3.77–5.28)
RDW: 19 % — AB (ref 12.3–15.4)
WBC: 3.5 10*3/uL (ref 3.4–10.8)

## 2017-10-31 LAB — TSH: TSH: 0.39 u[IU]/mL — ABNORMAL LOW (ref 0.450–4.500)

## 2017-10-31 LAB — T3: T3 TOTAL: 67 ng/dL — AB (ref 71–180)

## 2017-10-31 LAB — VITAMIN D 25 HYDROXY (VIT D DEFICIENCY, FRACTURES): Vit D, 25-Hydroxy: 26.2 ng/mL — ABNORMAL LOW (ref 30.0–100.0)

## 2017-11-02 NOTE — Telephone Encounter (Signed)
Completed and put on your desk this morning.

## 2017-11-07 ENCOUNTER — Encounter: Payer: Self-pay | Admitting: Nurse Practitioner

## 2017-12-11 ENCOUNTER — Other Ambulatory Visit: Payer: Self-pay | Admitting: Internal Medicine

## 2018-02-05 ENCOUNTER — Encounter: Payer: Self-pay | Admitting: Urgent Care

## 2018-02-06 ENCOUNTER — Encounter: Payer: Self-pay | Admitting: Urgent Care

## 2018-02-18 ENCOUNTER — Ambulatory Visit: Payer: Managed Care, Other (non HMO)

## 2018-02-18 ENCOUNTER — Ambulatory Visit: Payer: Managed Care, Other (non HMO) | Admitting: Hematology and Oncology

## 2018-02-19 ENCOUNTER — Encounter: Payer: Self-pay | Admitting: Hematology and Oncology

## 2018-02-19 ENCOUNTER — Inpatient Hospital Stay: Payer: Managed Care, Other (non HMO) | Attending: Hematology and Oncology | Admitting: Hematology and Oncology

## 2018-02-19 ENCOUNTER — Inpatient Hospital Stay: Payer: Managed Care, Other (non HMO)

## 2018-02-19 VITALS — BP 110/67 | HR 63 | Temp 97.6°F | Resp 18 | Ht 62.0 in | Wt 173.8 lb

## 2018-02-19 DIAGNOSIS — D560 Alpha thalassemia: Secondary | ICD-10-CM | POA: Insufficient documentation

## 2018-02-19 DIAGNOSIS — D509 Iron deficiency anemia, unspecified: Secondary | ICD-10-CM | POA: Diagnosis not present

## 2018-02-19 DIAGNOSIS — E039 Hypothyroidism, unspecified: Secondary | ICD-10-CM | POA: Insufficient documentation

## 2018-02-19 DIAGNOSIS — D582 Other hemoglobinopathies: Secondary | ICD-10-CM

## 2018-02-19 NOTE — Progress Notes (Signed)
No new changes noted today 

## 2018-02-19 NOTE — Progress Notes (Signed)
[facility] day:  02/19/18  Chief Complaint: Beverly Campbell is a 53 y.o. female with iron deficiency anemia who is seen for 6 month assessment.  HPI:  The patient was last seen in the hematology clinic on 08/20/2017.  At that time, patient was fatigued. She was sleepy all of the time. She continued with heavy menstrual bleeding. She had no other complaints. Exam was unremarkable.  Hematocrit was 36.5, hemoglobin 11.4, and MCV 63.  Ferritin was 24. She received Feraheme x 1.  We discussed follow-up with gynecology.  [facility] labs on 02/04/2018 revealed a hematocrit of 39.7, hemoglobin 12.4, MCV 64, platelets 268,000, WBC 4300 with an ANC of 2200.  Ferritin was 124.   During the interim, patient has been doing well. Menstrual cycles are "slowing down". She is only having cycles every 3 to 4 months. Patient has been seen by PCP/GYN and has been found to be perimenopausal. Patient denies any acute complaints. Her energy has improved over all. Patient denies bleeding; no hematochezia, melena, or gross hematuria. Patient denies that she has experienced any B symptoms. She denies any interval infections.   Patient advises that she maintains an adequate appetite. She is eating well. Weight today is 173 lb 13.3 oz (78.8 kg), which compared to her last visit to the clinic, represents a 1 pound decrease. Patient notes desire to lose weight, with goal weight being around 125 pounds.   Patient denies pain in the clinic today.   Past Medical History:  Diagnosis Date  . Anemia   . Hypothyroidism   . Thyroid disease     Past Surgical History:  Procedure Laterality Date  . COLONOSCOPY WITH PROPOFOL N/A 01/01/2016   Procedure: COLONOSCOPY WITH PROPOFOL;  Surgeon: Jonathon Bellows, MD;  Location: [facility] ENDOSCOPY;  Service: Endoscopy;  Laterality: N/A;  . ESOPHAGOGASTRODUODENOSCOPY (EGD) WITH PROPOFOL N/A 01/01/2016   Procedure: ESOPHAGOGASTRODUODENOSCOPY  (EGD) WITH PROPOFOL;  Surgeon: Jonathon Bellows, MD;  Location: [facility] ENDOSCOPY;  Service: Endoscopy;  Laterality: N/A;  . THYROID SURGERY  1997   had thyroid removed  . TUBAL LIGATION  1995    Family History  Problem Relation Age of Onset  . Colon cancer Father   . Hypertension Mother   . Diabetes Mother     Social History:  reports that she has never smoked. She has never used smokeless tobacco. She reports current alcohol use. She reports that she does not use drugs.  The patient is from [place].  She likes to be called "[name]"; pronounced like Joy with an "L". She came to the [place] in 1980.  She lives in [place].  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  She has 3 children (ages 23, 67, 66) who are alive and well.  No family history of anemia or thalassesmia.  The patient is alone today.  Allergies: No Known Allergies  Current Medications: Current Outpatient Medications  Medication Sig Dispense Refill  . levothyroxine (SYNTHROID, LEVOTHROID) 100 MCG tablet TAKE 1 TABLET BY MOUTH  DAILY 90 tablet 2  . Multiple Vitamins-Iron (MULTIVITAMIN/IRON PO) Take by mouth.    Marland Kitchen amoxicillin-clavulanate (AUGMENTIN) 875-125 MG tablet Take 1 tablet by mouth 2 (two) times daily. 14 tablet 0   No current facility-administered medications for this visit.     Review of Systems  Constitutional: Negative.  Negative for chills, diaphoresis, fever, malaise/fatigue and weight loss (1 pound- intentional; goal weight 125 pounds).       Energy level  is "good".  HENT: Negative.  Negative for congestion, ear pain, nosebleeds, sinus pain and sore throat.   Eyes: Negative.  Negative for blurred vision, double vision, photophobia and pain.  Respiratory: Negative.  Negative for cough, hemoptysis, sputum production and shortness of breath.   Cardiovascular: Negative.  Negative for palpitations, orthopnea, leg swelling and PND.  Gastrointestinal: Negative.  Negative for abdominal pain, blood in stool, constipation,  diarrhea, melena and nausea.  Genitourinary: Negative.  Negative for dysuria, frequency, hematuria and urgency.       Menses, improving.  Musculoskeletal: Negative.  Negative for back pain, falls, joint pain, myalgias and neck pain.  Skin: Negative.  Negative for itching and rash.  Neurological: Negative.  Negative for dizziness, tremors, sensory change, speech change, focal weakness, weakness and headaches.  Endo/Heme/Allergies: Does not bruise/bleed easily.       HYPOthyroidism on levothyroxine.  Psychiatric/Behavioral: Negative.  Negative for depression and memory loss. The patient is not nervous/anxious and does not have insomnia.   All other systems reviewed and are negative.  Performance status (ECOG): 0  Vital Signs BP 110/67 (BP Location: Right Arm, Patient Position: Sitting)   Pulse 63   Temp 97.6 F (36.4 C) (Tympanic)   Resp 18   Ht 5\' 2"  (1.575 m)   Wt 173 lb 13.3 oz (78.8 kg)   SpO2 98%   BMI 31.79 kg/m   Physical Exam  Constitutional: She is oriented to person, place, and time and well-developed, well-nourished, and in no distress. No distress.  HENT:  Head: Normocephalic and atraumatic.  Mouth/Throat: Oropharynx is clear and moist. No oropharyngeal exudate.  Long dark hair.  Eyes: Pupils are equal, round, and reactive to light. Conjunctivae and EOM are normal. No scleral icterus.  Glasses.  Brown eyes.  Neck: Normal range of motion. Neck supple. No JVD present.  Cardiovascular: Normal rate, regular rhythm and normal heart sounds. Exam reveals no gallop and no friction rub.  No murmur heard. Pulmonary/Chest: Effort normal and breath sounds normal. No respiratory distress. She has no wheezes. She has no rales.  Abdominal: Soft. Bowel sounds are normal. She exhibits no distension and no mass. There is no abdominal tenderness. There is no rebound and no guarding.  Musculoskeletal: Normal range of motion.        General: No tenderness or edema.  Lymphadenopathy:     She has no cervical adenopathy.  Neurological: She is alert and oriented to person, place, and time. Gait normal.  Skin: Skin is warm and dry. No rash noted. She is not diaphoretic. No erythema. No pallor.  Psychiatric: Mood, affect and judgment normal.  Nursing note and vitals reviewed.   Orders Only on 10/30/2017  Component Date Value Ref Range Status  . Glucose 10/30/2017 91  65 - 99 mg/dL Final  . BUN 10/30/2017 13  6 - 24 mg/dL Final  . Creatinine, Ser 10/30/2017 0.88  0.57 - 1.00 mg/dL Final  . GFR calc non Af Amer 10/30/2017 76  >59 mL/min/1.73 Final  . GFR calc Af Amer 10/30/2017 87  >59 mL/min/1.73 Final  . BUN/Creatinine Ratio 10/30/2017 15  9 - 23 Final  . Sodium 10/30/2017 140  134 - 144 mmol/L Final  . Potassium 10/30/2017 4.4  3.5 - 5.2 mmol/L Final  . Chloride 10/30/2017 103  96 - 106 mmol/L Final  . CO2 10/30/2017 25  20 - 29 mmol/L Final  . Calcium 10/30/2017 9.3  8.7 - 10.2 mg/dL Final  . Total Protein 10/30/2017 6.6  6.0 - 8.5 g/dL Final  . Albumin 10/30/2017 4.4  3.5 - 5.5 g/dL Final  . Globulin, Total 10/30/2017 2.2  1.5 - 4.5 g/dL Final  . Albumin/Globulin Ratio 10/30/2017 2.0  1.2 - 2.2 Final  . Bilirubin Total 10/30/2017 0.8  0.0 - 1.2 mg/dL Final  . Alkaline Phosphatase 10/30/2017 50  39 - 117 IU/L Final  . AST 10/30/2017 17  0 - 40 IU/L Final  . ALT 10/30/2017 17  0 - 32 IU/L Final  . WBC 10/30/2017 3.5  3.4 - 10.8 x10E3/uL Final  . RBC 10/30/2017 5.53* 3.77 - 5.28 x10E6/uL Final  . Hemoglobin 10/30/2017 11.1  11.1 - 15.9 g/dL Final  . Hematocrit 10/30/2017 34.4  34.0 - 46.6 % Final  . MCV 10/30/2017 62* 79 - 97 fL Final  . MCH 10/30/2017 20.1* 26.6 - 33.0 pg Final  . MCHC 10/30/2017 32.3  31.5 - 35.7 g/dL Final  . RDW 10/30/2017 19.0* 12.3 - 15.4 % Final  . Platelets 10/30/2017 201  150 - 450 x10E3/uL Final  . Free T4 10/30/2017 1.83* 0.82 - 1.77 ng/dL Final  . TSH 10/30/2017 0.390* 0.450 - 4.500 uIU/mL Final  . Vit D, 25-Hydroxy 10/30/2017 26.2* 30.0  - 100.0 ng/mL Final   Comment: Vitamin D deficiency has been defined by the Institute of Medicine and an Endocrine Society practice guideline as a level of serum 25-OH vitamin D less than 20 ng/mL (1,2). The Endocrine Society went on to further define vitamin D insufficiency as a level between 21 and 29 ng/mL (2). 1. IOM (Institute of Medicine). 2010. Dietary reference    intakes for calcium and D. [place]: The    [employer]. 2. Holick MF, Binkley [place], Bischoff-Ferrari HA, et al.    Evaluation, treatment, and prevention of vitamin D    deficiency: an Endocrine Society clinical practice    guideline. JCEM. 2011 Jul; 96(7):1911-30.   . T3, Total 10/30/2017 67* 71 - 180 ng/dL Final    [facility] Labs: 08/24/2015: hematocrit 30.2, hemoglobin 11.9, and MCV 58.  Ferritin was 8 with an iron saturation of 8% and a TIBC of 365. 10/12/2015: hematocrit 33.5, hemoglobin 10.5, and MCV 60.  Ferritin was 98. 03/01/2016: hematocrit 25.1, hemoglobin 7.7, MCV 55, platelets 310,000, WBC 4800.  Ferritin was 6. Iron saturation was 5% with a TIBC of 386.  CMP was normal.   04/17/2016: hematocrit 27.6, hemoglobin 8.0, MCV 54, platelets 282,000, WBC 4000 with an ANC of 1700. Ferritin was 5.  Iron saturation was 6% with a TIBC of 386. 06/03/2016: hematocrit 33.6, hemoglobin 10.6, MCV 61, platelets 192,000, WBC 3800 with an ANC of 1700. Ferritin was 77.  Iron saturation 44% with a TIBC of 277. 09/11/2016: hematocrit 32.7, hemoglobin 10.8, MCV 60, platelets 204,000, WBC 4800 with an ANC of 2700. Ferritin was 11.  Iron saturation 17% with a TIBC of 337. B12 was 414. Folate >20. CMP, lipid panel, and thyroid panel were normal. Vitamin D was 21.3.  12/20/2016: hematocrit 36.9, hemoglobin 12.0, MCV 65, platelets 226,000, WBC 4500 with an ANC of 2500.  Ferritin was 45. 03/17/2017: hematocrit 33.4, hemoglobin 10.2, MCV 62, platelets 296,000, WBC 4400 with an ANC of 2300.  Ferritin was 9. 08/18/2017:  hematocrit 36.5, hemoglobin 11.4, MCV 63, platelets 286,000, WBC 4000 with an ANC of 2000.  Ferritin was 24.  INR 1.0.  Von Willebrand's panel was normal. 02/04/2018: hematocrit 39.7, hemoglobin 12.4, MCV 64, platelets 268,000, WBC 4300 with an ANC of 2200.  Ferritin was 124.     Assessment:  Norris Mayfield is a 52 y.o. female from [place] with a long standing history of anemia.  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  Oral iron causes diarrhea.  EGD on 01/01/2016 was normal.  Duodenal biopsy was negative   Colonoscopy on 01/01/2016 revealed diverticulosis in the entire colon and non-bleeding internal hemorrhoids.  She denies any melena or hematochezia.  Diet is good.  She has a history of of ice pica.  Normal labs on 08/24/2015 and 09/11/2016 included:  B12, folate, and TSH.  von Willebrand panel iwas normal on 08/18/2017.  She received Feraheme 1020 mg on 04/05/2013.  Ferritin improved from 22 to 273.  Hematocrit increased to 32.3.  Microcytic indices persisted.  She received Feraheme 510 mg on 09/20/2015, 04/17/2016,  04/23/2016, 03/19/2017, and 08/20/2017.  Ferritin has been followed: 8 on 08/24/2015, 98 on 10/12/2015, 6 on 03/01/2016, 5 on 04/17/2016, 77 on 06/03/2016, 11 on 09/11/2016,  45 on 12/20/2016, 9 on 03/17/2017, 24 on 08/18/2017, and 124 on 02/04/2018.  Symptomatically, she feels good.  Exam is normal.  Hemoglobin is 12.4.  Ferritin is 124.  Plan: 1.   Review [facility] labs from 02/04/2018.  2.   Iron deficiency anemia  Hemoglobin 12.4.  Hematocrit 39.7. Ferritin 124 (normal). Menses improved. No Feraheme today. 3.   Microcytic RBC indices  Secondary to hemoglobin H disease.  With iron deficiency, microcytosis increases. 4.   [facility] lab slip in 6 months and 1 year (CBC with diff, ferritin). 5.   RTC in 1 year for MD assessment and review of [facility] labs.    Honor Loh, NP  02/19/2018, 10:44 AM   I saw  and evaluated the patient, participating in the key portions of the service and reviewing pertinent diagnostic studies and records.  I reviewed the nurse practitioner's note and agree with the findings and the plan.  The assessment and plan were discussed with the patient.  A few questions were asked by the patient and answered.   Lequita Asal, MD 02/19/2018, 10:44 AM

## 2018-04-06 ENCOUNTER — Encounter: Payer: Self-pay | Admitting: Adult Health

## 2018-04-06 ENCOUNTER — Ambulatory Visit: Payer: Managed Care, Other (non HMO) | Admitting: Adult Health

## 2018-04-06 ENCOUNTER — Other Ambulatory Visit: Payer: Self-pay

## 2018-04-06 VITALS — BP 122/86 | HR 70 | Temp 98.0°F | Resp 16 | Ht 62.0 in | Wt 175.0 lb

## 2018-04-06 DIAGNOSIS — J988 Other specified respiratory disorders: Secondary | ICD-10-CM | POA: Diagnosis not present

## 2018-04-06 DIAGNOSIS — R062 Wheezing: Secondary | ICD-10-CM | POA: Diagnosis not present

## 2018-04-06 DIAGNOSIS — R059 Cough, unspecified: Secondary | ICD-10-CM

## 2018-04-06 DIAGNOSIS — R05 Cough: Secondary | ICD-10-CM | POA: Diagnosis not present

## 2018-04-06 MED ORDER — AZITHROMYCIN 250 MG PO TABS: ORAL_TABLET | ORAL | 0 refills | Status: DC

## 2018-04-06 MED ORDER — ALBUTEROL SULFATE HFA 108 (90 BASE) MCG/ACT IN AERS: 2.0000 | INHALATION_SPRAY | Freq: Four times a day (QID) | RESPIRATORY_TRACT | 0 refills | Status: DC | PRN

## 2018-04-06 NOTE — Progress Notes (Signed)
[facility] [place], [place] [address]  Internal MEDICINE  Office Visit Note  Patient Name: Beverly Campbell  [id number]  [id number]  Date of Service: 04/06/2018  Chief Complaint  Patient presents with  . Cough    wheezing with breathing  , chest uncomfortable with breathing   . Fatigue     HPI Pt is here for a sick visit. Pt reports a week of wheezing, chest congestion.  She reports a non-productive cough.  She denies any chest pain, fever, headaches, or hemoptysis.  She has been using tylenol cold and flu, with some relief.     Current Medication:  Outpatient Encounter Medications as of 04/06/2018  Medication Sig  . levothyroxine (SYNTHROID, LEVOTHROID) 100 MCG tablet TAKE 1 TABLET BY MOUTH  DAILY  . Multiple Vitamins-Iron (MULTIVITAMIN/IRON PO) Take by mouth.  Marland Kitchen albuterol (PROVENTIL HFA;VENTOLIN HFA) 108 (90 Base) MCG/ACT inhaler Inhale 2 puffs into the lungs every 6 (six) hours as needed for wheezing or shortness of breath.  Marland Kitchen azithromycin (ZITHROMAX) 250 MG tablet Take as directed  . [DISCONTINUED] amoxicillin-clavulanate (AUGMENTIN) 875-125 MG tablet Take 1 tablet by mouth 2 (two) times daily. (Patient not taking: Reported on 04/06/2018)   No facility-administered encounter medications on file as of 04/06/2018.       Medical History: Past Medical History:  Diagnosis Date  . Anemia   . Hypothyroidism   . Thyroid disease      Vital Signs: BP 122/86   Pulse 70   Temp 98 F (36.7 C)   Resp 16   Ht 5\' 2"  (1.575 m)   Wt 175 lb (79.4 kg)   SpO2 95%   BMI 32.01 kg/m    Review of Systems  Constitutional: Negative for chills, fatigue and unexpected weight change.  HENT: Negative for congestion, rhinorrhea, sneezing and sore throat.   Eyes: Negative for photophobia, pain and redness.  Respiratory: Negative for cough, chest tightness and shortness of breath.   Cardiovascular: Negative for chest pain and palpitations.   Gastrointestinal: Negative for abdominal pain, constipation, diarrhea, nausea and vomiting.  Endocrine: Negative.   Genitourinary: Negative for dysuria and frequency.  Musculoskeletal: Negative for arthralgias, back pain, joint swelling and neck pain.  Skin: Negative for rash.  Allergic/Immunologic: Negative.   Neurological: Negative for tremors and numbness.  Hematological: Negative for adenopathy. Does not bruise/bleed easily.  Psychiatric/Behavioral: Negative for behavioral problems and sleep disturbance. The patient is not nervous/anxious.     Physical Exam Vitals signs and nursing note reviewed.  Constitutional:      General: She is not in acute distress.    Appearance: She is well-developed. She is not diaphoretic.  HENT:     Head: Normocephalic and atraumatic.     Mouth/Throat:     Pharynx: No oropharyngeal exudate.  Eyes:     Pupils: Pupils are equal, round, and reactive to light.  Neck:     Musculoskeletal: Normal range of motion and neck supple.     Thyroid: No thyromegaly.     Vascular: No JVD.     Trachea: No tracheal deviation.  Cardiovascular:     Rate and Rhythm: Normal rate and regular rhythm.     Heart sounds: Normal heart sounds. No murmur. No friction rub. No gallop.   Pulmonary:     Effort: Pulmonary effort is normal. No respiratory distress.     Breath sounds: Normal breath sounds. No wheezing or rales.  Chest:     Chest wall: No tenderness.  Abdominal:     Palpations: Abdomen is soft.     Tenderness: There is no abdominal tenderness. There is no guarding.  Musculoskeletal: Normal range of motion.  Lymphadenopathy:     Cervical: No cervical adenopathy.  Skin:    General: Skin is warm and dry.  Neurological:     Mental Status: She is alert and oriented to person, place, and time.     Cranial Nerves: No cranial nerve deficit.  Psychiatric:        Behavior: Behavior normal.        Thought Content: Thought content normal.        Judgment: Judgment  normal.    Assessment/Plan: 1. Respiratory infection Patient provided with course of azithromycin.  Sure to take all medications until completed. - azithromycin (ZITHROMAX) 250 MG tablet; Take as directed  Dispense: 6 tablet; Refill: 0  2. Wheezing Use albuterol inhaler as instructed.  If wheeze occurs that is uncontrollable patient is to go directly to the emergency room. - albuterol (PROVENTIL HFA;VENTOLIN HFA) 108 (90 Base) MCG/ACT inhaler; Inhale 2 puffs into the lungs every 6 (six) hours as needed for wheezing or shortness of breath.  Dispense: 1 Inhaler; Refill: 0  3. Cough Dry cough at this time. Use albuterol and return to clinic if symptoms fail to improve. I'm hopeful that azithromycin antiinflammatory properties will help with this also.   General Counseling: Beverly Campbell verbalizes understanding of the findings of todays visit and agrees with plan of treatment. I have discussed any further diagnostic evaluation that may be needed or ordered today. We also reviewed her medications today. she has been encouraged to call the office with any questions or concerns that should arise related to todays visit.   No orders of the defined types were placed in this encounter.   Meds ordered this encounter  Medications  . albuterol (PROVENTIL HFA;VENTOLIN HFA) 108 (90 Base) MCG/ACT inhaler    Sig: Inhale 2 puffs into the lungs every 6 (six) hours as needed for wheezing or shortness of breath.    Dispense:  1 Inhaler    Refill:  0  . azithromycin (ZITHROMAX) 250 MG tablet    Sig: Take as directed    Dispense:  6 tablet    Refill:  0    Time spent: 25 Minutes  This patient was seen by Orson Gear AGNP-C in Collaboration with Dr Lavera Guise as a part of collaborative care agreement.  Kendell Bane AGNP-C Internal Medicine

## 2018-04-09 ENCOUNTER — Telehealth: Payer: Self-pay

## 2018-04-09 NOTE — Telephone Encounter (Signed)
Pt called that she saw [place] Monday still same symptoms no fever as per adam finished antibiotic use inhaler and take Otc cough syrup and if she don't feel better call us back

## 2018-04-14 ENCOUNTER — Ambulatory Visit
Admission: RE | Admit: 2018-04-14 | Discharge: 2018-04-14 | Disposition: A | Discharge status: Home / Self Care | Payer: Managed Care, Other (non HMO) | Attending: Nurse Practitioner | Admitting: Nurse Practitioner

## 2018-04-14 ENCOUNTER — Encounter: Payer: Self-pay | Admitting: Nurse Practitioner

## 2018-04-14 ENCOUNTER — Telehealth: Payer: Self-pay

## 2018-04-14 ENCOUNTER — Ambulatory Visit: Payer: Self-pay | Admitting: Nurse Practitioner

## 2018-04-14 ENCOUNTER — Other Ambulatory Visit: Payer: Self-pay

## 2018-04-14 ENCOUNTER — Ambulatory Visit: Payer: Managed Care, Other (non HMO) | Admitting: Nurse Practitioner

## 2018-04-14 ENCOUNTER — Ambulatory Visit
Admission: RE | Admit: 2018-04-14 | Discharge: 2018-04-14 | Disposition: A | Discharge status: Home / Self Care | Payer: Managed Care, Other (non HMO) | Source: Ambulatory Visit | Attending: Nurse Practitioner | Admitting: Nurse Practitioner

## 2018-04-14 VITALS — BP 115/83 | HR 79 | Temp 98.9°F | Resp 16 | Ht 61.0 in | Wt 178.6 lb

## 2018-04-14 DIAGNOSIS — R05 Cough: Secondary | ICD-10-CM | POA: Insufficient documentation

## 2018-04-14 DIAGNOSIS — J988 Other specified respiratory disorders: Secondary | ICD-10-CM

## 2018-04-14 DIAGNOSIS — R059 Cough, unspecified: Secondary | ICD-10-CM

## 2018-04-14 MED ORDER — HYDROCOD POLST-CPM POLST ER 10-8 MG/5ML PO SUER: 5.0000 mL | Freq: Two times a day (BID) | ORAL | 0 refills | Status: DC | PRN

## 2018-04-14 MED ORDER — LEVOFLOXACIN 500 MG PO TABS: 500.0000 mg | ORAL_TABLET | Freq: Every day | ORAL | 0 refills | Status: DC

## 2018-04-14 NOTE — Telephone Encounter (Signed)
Pt advised chest xray showed bronchitis no pneumonia

## 2018-04-14 NOTE — Telephone Encounter (Signed)
-----   Message from Ronnell Freshwater, NP sent at 04/14/2018  2:47 PM EDT ----- Please let the patient know that her chest x-ray showing mild bronchitis. No pneumonia present. thanks

## 2018-04-14 NOTE — Progress Notes (Signed)
[facility] [place], [place] [address]  Internal MEDICINE  Office Visit Note  Patient Name: Anneliese Leblond  [id number]  [id number]  Date of Service: 04/27/2018  Chief Complaint  Patient presents with  . Medical Management of Chronic Issues    6 month follow up  . Cough    dry cough been going on for about 3wks now, some headache with the cough, no fever or chills, body aches from coughing, pt has not been around anyone sick    The patient was seen last week for cough and wheezing last week. Was started on z-pack and given an inhaler to use as needed. She has completed the antibiotics and she is feeling no better. Wheezing is worse with lying on her left side. Tilting her head back seems to improve this. She continues to be congested and hears herself wheezing, especially at night.       Current Medication: Outpatient Encounter Medications as of 04/14/2018  Medication Sig  . albuterol (PROVENTIL HFA;VENTOLIN HFA) 108 (90 Base) MCG/ACT inhaler Inhale 2 puffs into the lungs every 6 (six) hours as needed for wheezing or shortness of breath.  . levothyroxine (SYNTHROID, LEVOTHROID) 100 MCG tablet TAKE 1 TABLET BY MOUTH  DAILY  . Multiple Vitamins-Iron (MULTIVITAMIN/IRON PO) Take by mouth.  Marland Kitchen azithromycin (ZITHROMAX) 250 MG tablet Take as directed (Patient not taking: Reported on 04/14/2018)  . chlorpheniramine-HYDROcodone (TUSSIONEX PENNKINETIC ER) 10-8 MG/5ML SUER Take 5 mLs by mouth every 12 (twelve) hours as needed for cough.  Marland Kitchen levofloxacin (LEVAQUIN) 500 MG tablet Take 1 tablet (500 mg total) by mouth daily.   No facility-administered encounter medications on file as of 04/14/2018.     Surgical History: Past Surgical History:  Procedure Laterality Date  . COLONOSCOPY WITH PROPOFOL N/A 01/01/2016   Procedure: COLONOSCOPY WITH PROPOFOL;  Surgeon: Jonathon Bellows, MD;  Location: [facility];  Service: Endoscopy;  Laterality: N/A;  .  ESOPHAGOGASTRODUODENOSCOPY (EGD) WITH PROPOFOL N/A 01/01/2016   Procedure: ESOPHAGOGASTRODUODENOSCOPY (EGD) WITH PROPOFOL;  Surgeon: Jonathon Bellows, MD;  Location: [facility] ENDOSCOPY;  Service: Endoscopy;  Laterality: N/A;  . THYROID SURGERY  1997   had thyroid removed  . TUBAL LIGATION  1995    Medical History: Past Medical History:  Diagnosis Date  . Anemia   . Hypothyroidism   . Thyroid disease     Family History: Family History  Problem Relation Age of Onset  . Colon cancer Father   . Hypertension Mother   . Diabetes Mother     Social History   Socioeconomic History  . Marital status: Divorced    Spouse name: Not on file  . Number of children: Not on file  . Years of education: Not on file  . Highest education level: Not on file  Occupational History  . Not on file  Social Needs  . Financial resource strain: Not on file  . Food insecurity:    Worry: Not on file    Inability: Not on file  . Transportation needs:    Medical: Not on file    Non-medical: Not on file  Tobacco Use  . Smoking status: Never Smoker  . Smokeless tobacco: Never Used  Substance and Sexual Activity  . Alcohol use: Yes    Comment: occasionally  . Drug use: No  . Sexual activity: Yes    Birth control/protection: None  Lifestyle  . Physical activity:    Days per week: Not on file    Minutes per session: Not  on file  . Stress: Not on file  Relationships  . Social connections:    Talks on phone: Not on file    Gets together: Not on file    Attends religious service: Not on file    Active member of club or organization: Not on file    Attends meetings of clubs or organizations: Not on file    Relationship status: Not on file  . Intimate partner violence:    Fear of current or ex partner: Not on file    Emotionally abused: Not on file    Physically abused: Not on file    Forced sexual activity: Not on file  Other Topics Concern  . Not on file  Social History Narrative  . Not on file       Review of Systems  Constitutional: Positive for chills and fatigue. Negative for activity change, fever and unexpected weight change.  HENT: Positive for congestion, postnasal drip and rhinorrhea. Negative for sneezing and sore throat.   Respiratory: Positive for cough and wheezing. Negative for chest tightness and shortness of breath.   Cardiovascular: Negative for chest pain and palpitations.  Gastrointestinal: Negative for abdominal pain, constipation, diarrhea, nausea and vomiting.  Endocrine: Negative for cold intolerance, heat intolerance, polydipsia and polyuria.  Musculoskeletal: Positive for arthralgias. Negative for back pain, joint swelling and neck pain.  Skin: Negative for rash.  Allergic/Immunologic: Positive for environmental allergies. Negative for food allergies.  Neurological: Positive for headaches. Negative for dizziness, tremors and numbness.  Hematological: Positive for adenopathy. Does not bruise/bleed easily.  Psychiatric/Behavioral: Negative for behavioral problems (Depression), sleep disturbance and suicidal ideas. The patient is not nervous/anxious.     Today's Vitals   04/14/18 1125  BP: 115/83  Pulse: 79  Resp: 16  Temp: 98.9 F (37.2 C)  SpO2: 95%  Weight: 178 lb 9.6 oz (81 kg)  Height: 5\' 1"  (1.549 m)   Body mass index is 33.75 kg/m.  Physical Exam Vitals signs and nursing note reviewed.  Constitutional:      General: She is not in acute distress.    Appearance: Normal appearance. She is well-developed. She is not diaphoretic.  HENT:     Head: Normocephalic and atraumatic.     Right Ear: Tympanic membrane is erythematous and bulging.     Left Ear: Tympanic membrane is erythematous and bulging.     Nose: Congestion present.     Right Sinus: Maxillary sinus tenderness and frontal sinus tenderness present.     Left Sinus: Maxillary sinus tenderness and frontal sinus tenderness present.     Mouth/Throat:     Pharynx: Posterior  oropharyngeal erythema present. No oropharyngeal exudate.  Eyes:     Pupils: Pupils are equal, round, and reactive to light.  Neck:     Musculoskeletal: Normal range of motion and neck supple.     Thyroid: No thyromegaly.     Vascular: No JVD.     Trachea: No tracheal deviation.  Cardiovascular:     Rate and Rhythm: Normal rate and regular rhythm.     Heart sounds: Normal heart sounds. No murmur. No friction rub. No gallop.   Pulmonary:     Effort: Pulmonary effort is normal. No respiratory distress.     Breath sounds: Wheezing present. No rales.  Chest:     Chest wall: No tenderness.  Abdominal:     General: Bowel sounds are normal.     Palpations: Abdomen is soft.  Musculoskeletal: Normal range of motion.  Lymphadenopathy:     Cervical: No cervical adenopathy.  Skin:    General: Skin is warm and dry.  Neurological:     Mental Status: She is alert and oriented to person, place, and time.     Cranial Nerves: No cranial nerve deficit.  Psychiatric:        Behavior: Behavior normal.        Thought Content: Thought content normal.        Judgment: Judgment normal.   Assessment/Plan: 1. Respiratory infection Start levofloxacin 500mg  daily for 10 days. Rest and increase fluids. May take OTC medication to alleviate symptoms.  - levofloxacin (LEVAQUIN) 500 MG tablet; Take 1 tablet (500 mg total) by mouth daily.  Dispense: 10 tablet; Refill: 0  2. Cough Will get chest x-ray for further evaluation. May take tussionex up to twice daily as needed for cough. Advised patient not to overuse this medicine and not to mix with other medications or alcohol as it can cause respiratory distress, sleepiness or dizziness. Should also avoid driving. Patient voiced understanding and agreement.  - DG Chest 2 View; Future - chlorpheniramine-HYDROcodone (TUSSIONEX PENNKINETIC ER) 10-8 MG/5ML SUER; Take 5 mLs by mouth every 12 (twelve) hours as needed for cough.  Dispense: 115 mL; Refill: 0  General  Counseling: Samani verbalizes understanding of the findings of todays visit and agrees with plan of treatment. I have discussed any further diagnostic evaluation that may be needed or ordered today. We also reviewed her medications today. she has been encouraged to call the office with any questions or concerns that should arise related to todays visit.  Rest and increase fluids. Continue using OTC medication to control symptoms.   This patient was seen by Leretha Pol FNP Collaboration with Dr Lavera Guise as a part of collaborative care agreement  Orders Placed This Encounter  Procedures  . DG Chest 2 View    Meds ordered this encounter  Medications  . levofloxacin (LEVAQUIN) 500 MG tablet    Sig: Take 1 tablet (500 mg total) by mouth daily.    Dispense:  10 tablet    Refill:  0    Order Specific Question:   Supervising Provider    Answer:   Lavera Guise [[id number]]  . chlorpheniramine-HYDROcodone (TUSSIONEX PENNKINETIC ER) 10-8 MG/5ML SUER    Sig: Take 5 mLs by mouth every 12 (twelve) hours as needed for cough.    Dispense:  115 mL    Refill:  0    Order Specific Question:   Supervising Provider    Answer:   Lavera Guise [[id number]]    Time spent: 86 Minutes      Dr Lavera Guise Internal medicine

## 2018-04-27 DIAGNOSIS — R059 Cough, unspecified: Secondary | ICD-10-CM | POA: Insufficient documentation

## 2018-04-27 DIAGNOSIS — R05 Cough: Secondary | ICD-10-CM | POA: Insufficient documentation

## 2018-04-27 DIAGNOSIS — J988 Other specified respiratory disorders: Secondary | ICD-10-CM | POA: Insufficient documentation

## 2018-05-07 ENCOUNTER — Other Ambulatory Visit: Payer: Self-pay

## 2018-05-07 ENCOUNTER — Encounter: Payer: Self-pay | Admitting: Nurse Practitioner

## 2018-05-07 ENCOUNTER — Ambulatory Visit: Payer: Managed Care, Other (non HMO) | Admitting: Nurse Practitioner

## 2018-05-07 VITALS — Resp 16 | Ht 61.0 in | Wt 178.0 lb

## 2018-05-07 DIAGNOSIS — J988 Other specified respiratory disorders: Secondary | ICD-10-CM | POA: Diagnosis not present

## 2018-05-07 DIAGNOSIS — R062 Wheezing: Secondary | ICD-10-CM | POA: Diagnosis not present

## 2018-05-07 DIAGNOSIS — R5383 Other fatigue: Secondary | ICD-10-CM

## 2018-05-07 MED ORDER — METHYLPREDNISOLONE 4 MG PO TBPK: ORAL_TABLET | ORAL | 0 refills | Status: DC

## 2018-05-07 NOTE — Progress Notes (Signed)
[facility] [place], [place] [address]  Internal MEDICINE  Telephone Visit  Patient Name: Beverly Campbell  [id number]  [id number]  Date of Service: 05/07/2018  I connected with the patient at 11:00am by webcam and verified the patients identity using two identifiers.   I discussed the limitations, risks, security and privacy concerns of performing an evaluation and management service by webcam and the availability of in person appointments. I also discussed with the patient that there may be a patient responsible charge related to the service.  The patient expressed understanding and agrees to proceed.    Chief Complaint  Patient presents with  . Telephone Assessment  . Telephone Screen  . Cough    No mucus   . Shortness of Breath    The patient has been contacted via webcam for follow up visit due to concerns for spread of novel coronavirus. The patient was seen twice in the past few weeks related to upper respiratory infection and cough. Was initially started on z-pack and given an inhaler to use as needed. She then completed round levofloxacin 500mg  daily for 10 days. Chest x-ray did show some bronchitis She has completed both rounds of antibiotics. She still has cough and a lot of fatigue. Cough is not as bad. Still has some soreness in her chest and congestion. Worse when she is lying down .      Current Medication: Outpatient Encounter Medications as of 05/07/2018  Medication Sig  . albuterol (PROVENTIL HFA;VENTOLIN HFA) 108 (90 Base) MCG/ACT inhaler Inhale 2 puffs into the lungs every 6 (six) hours as needed for wheezing or shortness of breath.  . levofloxacin (LEVAQUIN) 500 MG tablet Take 1 tablet (500 mg total) by mouth daily.  Marland Kitchen levothyroxine (SYNTHROID, LEVOTHROID) 100 MCG tablet TAKE 1 TABLET BY MOUTH  DAILY  . Multiple Vitamins-Iron (MULTIVITAMIN/IRON PO) Take by mouth.  Marland Kitchen azithromycin (ZITHROMAX) 250 MG tablet Take as directed  (Patient not taking: Reported on 04/14/2018)  . chlorpheniramine-HYDROcodone (TUSSIONEX PENNKINETIC ER) 10-8 MG/5ML SUER Take 5 mLs by mouth every 12 (twelve) hours as needed for cough. (Patient not taking: Reported on 05/07/2018)  . methylPREDNISolone (MEDROL) 4 MG TBPK tablet Take by mouth as directed for 6 days   No facility-administered encounter medications on file as of 05/07/2018.     Surgical History: Past Surgical History:  Procedure Laterality Date  . COLONOSCOPY WITH PROPOFOL N/A 01/01/2016   Procedure: COLONOSCOPY WITH PROPOFOL;  Surgeon: Jonathon Bellows, MD;  Location: [facility]OSCOPY;  Service: Endoscopy;  Laterality: N/A;  . ESOPHAGOGASTRODUODENOSCOPY (EGD) WITH PROPOFOL N/A 01/01/2016   Procedure: ESOPHAGOGASTRODUODENOSCOPY (EGD) WITH PROPOFOL;  Surgeon: Jonathon Bellows, MD;  Location: [facility] ENDOSCOPY;  Service: Endoscopy;  Laterality: N/A;  . THYROID SURGERY  1997   had thyroid removed  . TUBAL LIGATION  1995    Medical History: Past Medical History:  Diagnosis Date  . Anemia   . Hypothyroidism   . Thyroid disease     Family History: Family History  Problem Relation Age of Onset  . Colon cancer Father   . Hypertension Mother   . Diabetes Mother     Social History   Socioeconomic History  . Marital status: Divorced    Spouse name: Not on file  . Number of children: Not on file  . Years of education: Not on file  . Highest education level: Not on file  Occupational History  . Not on file  Social Needs  . Financial resource strain: Not on  file  . Food insecurity:    Worry: Not on file    Inability: Not on file  . Transportation needs:    Medical: Not on file    Non-medical: Not on file  Tobacco Use  . Smoking status: Never Smoker  . Smokeless tobacco: Never Used  Substance and Sexual Activity  . Alcohol use: Yes    Comment: occasionally  . Drug use: No  . Sexual activity: Yes    Birth control/protection: None  Lifestyle  . Physical activity:    Days per  week: Not on file    Minutes per session: Not on file  . Stress: Not on file  Relationships  . Social connections:    Talks on phone: Not on file    Gets together: Not on file    Attends religious service: Not on file    Active member of club or organization: Not on file    Attends meetings of clubs or organizations: Not on file    Relationship status: Not on file  . Intimate partner violence:    Fear of current or ex partner: Not on file    Emotionally abused: Not on file    Physically abused: Not on file    Forced sexual activity: Not on file  Other Topics Concern  . Not on file  Social History Narrative  . Not on file      Review of Systems  Constitutional: Positive for fatigue. Negative for activity change, chills, fever and unexpected weight change.  HENT: Positive for congestion. Negative for postnasal drip, rhinorrhea, sneezing and sore throat.   Respiratory: Positive for cough, chest tightness and wheezing. Negative for shortness of breath.   Cardiovascular: Negative for chest pain and palpitations.  Gastrointestinal: Positive for nausea. Negative for abdominal pain, constipation, diarrhea and vomiting.  Endocrine: Negative for cold intolerance, heat intolerance, polydipsia and polyuria.  Musculoskeletal: Negative for arthralgias, back pain, joint swelling and neck pain.  Skin: Negative for rash.  Allergic/Immunologic: Positive for environmental allergies. Negative for food allergies.  Neurological: Positive for headaches. Negative for dizziness, tremors and numbness.  Hematological: Positive for adenopathy. Does not bruise/bleed easily.  Psychiatric/Behavioral: Negative for behavioral problems (Depression), sleep disturbance and suicidal ideas. The patient is not nervous/anxious.    Today's Vitals   05/07/18 0945  Resp: 16  Weight: 178 lb (80.7 kg)  Height: 5\' 1"  (1.549 m)   Body mass index is 33.63 kg/m.  Observation/Objective:  The patient is awake and  oriented. She appears fatigued. She is mildly congested. No wheezing is appreciated at this time. She does have dry, non-productive cough which is intermittent.    Assessment/Plan: 1. Respiratory infection Improving, but not resolved. Will start medrol 4mg  dose pack. Take as directed for 6 days. Will check CBC and BMP for further evaluation.  - methylPREDNISolone (MEDROL) 4 MG TBPK tablet; Take by mouth as directed for 6 days  Dispense: 21 tablet; Refill: 0 - CBC w/Diff/Platelet; Future - Basic Metabolic Panel (BMET); Future  2. Wheezing Continue to use rescue inhaler as needed and as prescribed   3. Other fatigue Will check CBC and BMP for further evaluation.  - CBC w/Diff/Platelet; Future - Basic Metabolic Panel (BMET); Future  General Counseling: Roise verbalizes understanding of the findings of today's phone visit and agrees with plan of treatment. I have discussed any further diagnostic evaluation that may be needed or ordered today. We also reviewed her medications today. she has been encouraged to call the office with  any questions or concerns that should arise related to todays visit.  Rest and increase fluids. Continue using OTC medication to control symptoms.   This patient was seen by Leretha Pol FNP Collaboration with Dr Lavera Guise as a part of collaborative care agreement  Orders Placed This Encounter  Procedures  . CBC w/Diff/Platelet  . Basic Metabolic Panel (BMET)    Meds ordered this encounter  Medications  . methylPREDNISolone (MEDROL) 4 MG TBPK tablet    Sig: Take by mouth as directed for 6 days    Dispense:  21 tablet    Refill:  0    Order Specific Question:   Supervising Provider    Answer:   Lavera Guise [[id number]]    Time spent: 61 Minutes    Dr Lavera Guise Internal medicine

## 2018-05-08 NOTE — Addendum Note (Signed)
Addended by: Lenon Oms on: 05/08/2018 03:49 PM   Modules accepted: Orders

## 2018-05-13 LAB — CBC WITH DIFFERENTIAL/PLATELET
Basophils Absolute: 0.1 10*3/uL (ref 0.0–0.2)
Basos: 2 %
EOS (ABSOLUTE): 0.1 10*3/uL (ref 0.0–0.4)
Eos: 2 %
Hematocrit: 36.7 % (ref 34.0–46.6)
Hemoglobin: 11.5 g/dL (ref 11.1–15.9)
Immature Grans (Abs): 0 10*3/uL (ref 0.0–0.1)
Immature Granulocytes: 0 %
Lymphocytes Absolute: 1.2 10*3/uL (ref 0.7–3.1)
Lymphs: 21 %
MCH: 20.2 pg — ABNORMAL LOW (ref 26.6–33.0)
MCHC: 31.3 g/dL — ABNORMAL LOW (ref 31.5–35.7)
MCV: 65 fL — ABNORMAL LOW (ref 79–97)
Monocytes Absolute: 0.4 10*3/uL (ref 0.1–0.9)
Monocytes: 7 %
Neutrophils Absolute: 3.8 10*3/uL (ref 1.4–7.0)
Neutrophils: 68 %
Platelets: 283 10*3/uL (ref 150–450)
RBC: 5.69 x10E6/uL — ABNORMAL HIGH (ref 3.77–5.28)
RDW: 19.3 % — ABNORMAL HIGH (ref 11.7–15.4)
WBC: 5.7 10*3/uL (ref 3.4–10.8)

## 2018-05-13 LAB — BASIC METABOLIC PANEL
BUN/Creatinine Ratio: 24 — ABNORMAL HIGH (ref 9–23)
BUN: 18 mg/dL (ref 6–24)
CO2: 23 mmol/L (ref 20–29)
Calcium: 9.8 mg/dL (ref 8.7–10.2)
Chloride: 105 mmol/L (ref 96–106)
Creatinine, Ser: 0.74 mg/dL (ref 0.57–1.00)
GFR calc Af Amer: 108 mL/min/{1.73_m2} (ref >59)
GFR calc non Af Amer: 93 mL/min/{1.73_m2} (ref >59)
Glucose: 106 mg/dL — ABNORMAL HIGH (ref 65–99)
Potassium: 4.2 mmol/L (ref 3.5–5.2)
Sodium: 143 mmol/L (ref 134–144)

## 2018-06-24 ENCOUNTER — Other Ambulatory Visit: Payer: Self-pay | Admitting: Nurse Practitioner

## 2018-06-24 ENCOUNTER — Telehealth: Payer: Self-pay

## 2018-06-24 DIAGNOSIS — R05 Cough: Secondary | ICD-10-CM

## 2018-06-24 DIAGNOSIS — R059 Cough, unspecified: Secondary | ICD-10-CM

## 2018-06-24 DIAGNOSIS — R062 Wheezing: Secondary | ICD-10-CM

## 2018-06-24 NOTE — Telephone Encounter (Signed)
Patient having persistent cough and wheezing. Order placed for chest x-ray for further evaluation.

## 2018-06-24 NOTE — Progress Notes (Signed)
Patient having persistent cough and wheezing. Order placed for chest x-ray for further evaluation.

## 2018-06-25 ENCOUNTER — Other Ambulatory Visit: Payer: Self-pay

## 2018-06-25 ENCOUNTER — Ambulatory Visit
Admission: RE | Admit: 2018-06-25 | Discharge: 2018-06-25 | Disposition: A | Discharge status: Home / Self Care | Payer: Managed Care, Other (non HMO) | Source: Ambulatory Visit | Attending: Nurse Practitioner | Admitting: Nurse Practitioner

## 2018-06-25 DIAGNOSIS — R05 Cough: Secondary | ICD-10-CM

## 2018-06-25 DIAGNOSIS — R059 Cough, unspecified: Secondary | ICD-10-CM

## 2018-06-25 DIAGNOSIS — R062 Wheezing: Secondary | ICD-10-CM | POA: Insufficient documentation

## 2018-06-25 NOTE — Telephone Encounter (Signed)
Pt was notified.  

## 2018-07-03 ENCOUNTER — Telehealth: Payer: Self-pay

## 2018-07-03 NOTE — Telephone Encounter (Signed)
Please let her know that chest x-ray was normal. Labs were good, thanks.

## 2018-07-03 NOTE — Telephone Encounter (Signed)
Pt advised for labs result and chest xray was normal

## 2018-10-16 ENCOUNTER — Other Ambulatory Visit: Payer: Managed Care, Other (non HMO) | Admitting: Nurse Practitioner

## 2019-02-18 ENCOUNTER — Telehealth: Payer: Self-pay

## 2019-02-18 NOTE — Telephone Encounter (Signed)
faxed lab orders to [facility] [phone]. Fax conformation confirmed.

## 2019-02-19 ENCOUNTER — Inpatient Hospital Stay: Payer: Managed Care, Other (non HMO) | Admitting: Hematology and Oncology

## 2019-02-19 ENCOUNTER — Encounter: Payer: Self-pay | Admitting: Adult Health

## 2019-02-19 ENCOUNTER — Ambulatory Visit: Payer: Managed Care, Other (non HMO) | Admitting: Adult Health

## 2019-02-19 VITALS — Ht 61.0 in | Wt 177.0 lb

## 2019-02-19 DIAGNOSIS — R05 Cough: Secondary | ICD-10-CM | POA: Diagnosis not present

## 2019-02-19 DIAGNOSIS — R059 Cough, unspecified: Secondary | ICD-10-CM

## 2019-02-19 DIAGNOSIS — R062 Wheezing: Secondary | ICD-10-CM

## 2019-02-19 DIAGNOSIS — E039 Hypothyroidism, unspecified: Secondary | ICD-10-CM | POA: Diagnosis not present

## 2019-02-19 MED ORDER — MONTELUKAST SODIUM 10 MG PO TABS: 10.0000 mg | ORAL_TABLET | Freq: Every day | ORAL | 1 refills | Status: DC

## 2019-02-19 NOTE — Progress Notes (Signed)
[facility] [place], [place] [address]  Internal MEDICINE  Telephone Visit  Patient Name: Beverly Campbell  [id number]  [id number]  Date of Service: 04/01/2019  I connected with the patient at 1111by telephone and verified the patients identity using two identifiers.   I discussed the limitations, risks, security and privacy concerns of performing an evaluation and management service by telephone and the availability of in person appointments. I also discussed with the patient that there may be a patient responsible charge related to the service.  The patient expressed understanding and agrees to proceed.    Chief Complaint  Patient presents with  . Telephone Assessment  . Telephone Screen  . Cough    FOR 3 DAYS, ESPECIALLY AT NIGHTTIME, FEELS LIKE TIGHTNESS/ SPASMS IN CHEST  . Wheezing    FEELS LIKE SHE IS WHEEZING BUT UNSURE    HPI  PT seen via video. She reports 3 days of dry cough.  She coughs intermittently day and night.  She reports she has been having ongoing drainage and chest congestion for nearly a year. She was prescribed albuterol inhaler in the past, but reports he did not see a difference when using it.  She reports at night she feels herself wheezing.  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   She did have part of her thyroid removed over 20 years ago she does have the sensation that there is pressure in her throat which makes her cough when talking.   Current Medication: Outpatient Encounter Medications as of 02/19/2019  Medication Sig  . Multiple Vitamins-Iron (MULTIVITAMIN/IRON PO) Take by mouth.  . [DISCONTINUED] levothyroxine (SYNTHROID, LEVOTHROID) 100 MCG tablet TAKE 1 TABLET BY MOUTH  DAILY  . [DISCONTINUED] albuterol (PROVENTIL HFA;VENTOLIN HFA) 108 (90 Base) MCG/ACT inhaler Inhale 2 puffs into the lungs every 6 (six) hours as needed for wheezing or shortness of breath. (Patient not  taking: Reported on 02/19/2019)  . [DISCONTINUED] azithromycin (ZITHROMAX) 250 MG tablet Take as directed (Patient not taking: Reported on 04/14/2018)  . [DISCONTINUED] chlorpheniramine-HYDROcodone (TUSSIONEX PENNKINETIC ER) 10-8 MG/5ML SUER Take 5 mLs by mouth every 12 (twelve) hours as needed for cough. (Patient not taking: Reported on 05/07/2018)  . [DISCONTINUED] levofloxacin (LEVAQUIN) 500 MG tablet Take 1 tablet (500 mg total) by mouth daily. (Patient not taking: Reported on 02/19/2019)  . [DISCONTINUED] methylPREDNISolone (MEDROL) 4 MG TBPK tablet Take by mouth as directed for 6 days (Patient not taking: Reported on 02/19/2019)  . [DISCONTINUED] montelukast (SINGULAIR) 10 MG tablet Take 1 tablet (10 mg total) by mouth at bedtime.   No facility-administered encounter medications on file as of 02/19/2019.    Surgical History: Past Surgical History:  Procedure Laterality Date  . COLONOSCOPY WITH PROPOFOL N/A 01/01/2016   Procedure: COLONOSCOPY WITH PROPOFOL;  Surgeon: Jonathon Bellows, MD;  Location: [facility] ENDOSCOPY;  Service: Endoscopy;  Laterality: N/A;  . ESOPHAGOGASTRODUODENOSCOPY (EGD) WITH PROPOFOL N/A 01/01/2016   Procedure: ESOPHAGOGASTRODUODENOSCOPY (EGD) WITH PROPOFOL;  Surgeon: Jonathon Bellows, MD;  Location: [facility] ENDOSCOPY;  Service: Endoscopy;  Laterality: N/A;  . THYROID SURGERY  1997   had thyroid removed  . TUBAL LIGATION  1995    Medical History: Past Medical History:  Diagnosis Date  . Anemia   . Hypothyroidism   . Thyroid disease     Family History: Family History  Problem Relation Age of Onset  . Colon cancer Father   . Hypertension Mother   . Diabetes Mother  Social History   Socioeconomic History  . Marital status: Divorced    Spouse name: Not on file  . Number of children: Not on file  . Years of education: Not on file  . Highest education level: Not on file  Occupational History  . Not on file  Tobacco Use  . Smoking status: Never Smoker  . Smokeless  tobacco: Never Used  Substance and Sexual Activity  . Alcohol use: Yes    Comment: occasionally  . Drug use: No  . Sexual activity: Yes    Birth control/protection: None  Other Topics Concern  . Not on file  Social History Narrative  . Not on file   Social Determinants of Health   Financial Resource Strain:   . Difficulty of Paying Living Expenses:   Food Insecurity:   . Worried About Charity fundraiser in the Last Year:   . Arboriculturist in the Last Year:   Transportation Needs:   . Film/video editor (Medical):   Marland Kitchen Lack of Transportation (Non-Medical):   Physical Activity:   . Days of Exercise per Week:   . Minutes of Exercise per Session:   Stress:   . Feeling of Stress :   Social Connections:   . Frequency of Communication with Friends and Family:   . Frequency of Social Gatherings with Friends and Family:   . Attends Religious Services:   . Active Member of Clubs or Organizations:   . Attends Archivist Meetings:   Marland Kitchen Marital Status:   Intimate Partner Violence:   . Fear of Current or Ex-Partner:   . Emotionally Abused:   Marland Kitchen Physically Abused:   . Sexually Abused:       Review of Systems  Constitutional: Negative for chills, fatigue and unexpected weight change.  HENT: Negative for congestion, rhinorrhea, sneezing and sore throat.   Eyes: Negative for photophobia, pain and redness.  Respiratory: Negative for cough, chest tightness and shortness of breath.   Cardiovascular: Negative for chest pain and palpitations.  Gastrointestinal: Negative for abdominal pain, constipation, diarrhea, nausea and vomiting.  Endocrine: Negative.   Genitourinary: Negative for dysuria and frequency.  Musculoskeletal: Negative for arthralgias, back pain, joint swelling and neck pain.  Skin: Negative for rash.  Allergic/Immunologic: Negative.   Neurological: Negative for tremors and numbness.  Hematological: Negative for adenopathy. Does not bruise/bleed easily.   Psychiatric/Behavioral: Negative for behavioral problems and sleep disturbance. The patient is not nervous/anxious.     Vital Signs: Ht 5\' 1"  (1.549 m)   Wt 177 lb (80.3 kg)   LMP 04/26/2018 (Exact Date) Comment: premenopausal  BMI 33.44 kg/m    Observation/Objective:  Well appearing, NAD noted. Nagging cough noted.   Assessment/Plan: 1. Cough Get PFT follow up after. - Pulmonary Function Test; Future  2. Wheezing NO wheezing today, continue to monitor.  3. Acquired hypothyroidism Stable, continue current mgmt.  General Counseling: Lakeitha verbalizes understanding of the findings of today's phone visit and agrees with plan of treatment. I have discussed any further diagnostic evaluation that may be needed or ordered today. We also reviewed her medications today. she has been encouraged to call the office with any questions or concerns that should arise related to todays visit.    Orders Placed This Encounter  Procedures  . Pulmonary Function Test    Meds ordered this encounter  Medications  . DISCONTD: montelukast (SINGULAIR) 10 MG tablet    Sig: Take 1 tablet (10 mg total) by mouth  at bedtime.    Dispense:  30 tablet    Refill:  1    Time spent: Adams AGNP-C Internal medicine

## 2019-02-23 ENCOUNTER — Encounter: Payer: Self-pay | Admitting: Hematology and Oncology

## 2019-02-23 NOTE — Progress Notes (Addendum)
[facility]  [address] [place], [place] [address] Phone: [phone]  Fax: [phone]  Telemedicine Office Visit:  02/25/2019  Referring physician: Ronnell Freshwater, NP  I connected with Beverly Campbell on 02/25/2019 at 11:24 AM by videoconferencing and verified that I was speaking with the correct person using 2 identifiers.  The patient was at home.  I discussed the limitations, risk, security and privacy concerns of performing an evaluation and management service by videoconferencing and the availability of in person appointments.  I also discussed with the patient that there may be a patient responsible charge related to this service.  The patient expressed understanding and agreed to proceed.   Chief Complaint: Beverly Campbell is a 54 y.o. female with iron deficiency anemia who is seen for 1 year assessment.   HPI: The patient was last seen in the hematology clinic on 02/19/2018.  At that time, she felt good.  Exam was normal.  Hematocrit was 39.7, hemoglobin 12.4, and MCV 64.  Ferritin was 124.   She was seen for URI and cough with Beverly Pol, NP on 04/14/2018. CXR showed minimal bronchitic changes without infiltrate. Shereceived levofloxacin and chlorpheniramine-HYDROcodone.  CBC has been followed: 05/12/2018:  Hematocrit 36.7, hemoglobin 11.5, MCV 65.0, platelets 283,000, WBC 5700, ANC 3800.  02/22/2019:  Hematocrit 37.8, hemoglobin 12.0, MCV 65.0, platelets 214,000, WBC 5800, ANC 2700.  Eosinophil count 800.  During the interim, she has felt "alright".  She is currently on Singulair to clear up the mucus secondary to URI.  She notes weight gain. She notes no periods in the past 3 to 4 months. She notes hot flashes. She has remained on her thyroid medication.   She is interested in continuing yearly follow ups.    Past Medical History:  Diagnosis Date  . Anemia   . Hypothyroidism   . Thyroid disease     Past  Surgical History:  Procedure Laterality Date  . COLONOSCOPY WITH PROPOFOL N/A 01/01/2016   Procedure: COLONOSCOPY WITH PROPOFOL;  Surgeon: Beverly Bellows, MD;  Location: [facility] ENDOSCOPY;  Service: Endoscopy;  Laterality: N/A;  . ESOPHAGOGASTRODUODENOSCOPY (EGD) WITH PROPOFOL N/A 01/01/2016   Procedure: ESOPHAGOGASTRODUODENOSCOPY (EGD) WITH PROPOFOL;  Surgeon: Beverly Bellows, MD;  Location: [facility] ENDOSCOPY;  Service: Endoscopy;  Laterality: N/A;  . THYROID SURGERY  1997   had thyroid removed  . TUBAL LIGATION  1995    Family History  Problem Relation Age of Onset  . Colon cancer Father   . Hypertension Mother   . Diabetes Mother     Social History:  reports that she has never smoked. She has never used smokeless tobacco. She reports current alcohol use. She reports that she does not use drugs. The patient is from [place].  She likes to be called "[name]"; pronounced like Joy with an "L". She came to the [place] in 1980.  She lives in [place].  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  She has 3 children (ages 27, 76, 75) who are alive and well.  No family history of anemia or thalassesmia. The patient is alone today.  Participants in the patient's visit and their role in the encounter included the patient and Beverly Campbell, CMA, today.  The intake visit was provided by Beverly Campbell, CMA.  Allergies: No Known Allergies  Current Medications: Current Outpatient Medications  Medication Sig Dispense Refill  . levothyroxine (SYNTHROID, LEVOTHROID) 100 MCG tablet TAKE 1 TABLET BY MOUTH  DAILY 90 tablet 2  .  montelukast (SINGULAIR) 10 MG tablet Take 1 tablet (10 mg total) by mouth at bedtime. 30 tablet 1  . Multiple Vitamins-Iron (MULTIVITAMIN/IRON PO) Take by mouth.     No current facility-administered medications for this visit.    Review of Systems  Constitutional: Negative.  Negative for chills, diaphoresis, fever, malaise/fatigue and weight loss (gaining).       Feels "alright".   HENT: Negative.  Negative for congestion, ear pain, nosebleeds, sinus pain and sore throat.        On Singulair to clear up mucus.  Eyes: Negative.  Negative for blurred vision, double vision, photophobia and pain.  Respiratory: Negative.  Negative for cough, hemoptysis, sputum production and shortness of breath.   Cardiovascular: Negative.  Negative for palpitations, orthopnea, leg swelling and PND.  Gastrointestinal: Negative.  Negative for abdominal pain, blood in stool, constipation, diarrhea, melena and nausea.  Genitourinary: Negative.  Negative for dysuria, frequency, hematuria and urgency.       No menses in the past 3-4 months.  Musculoskeletal: Negative.  Negative for back pain, falls, joint pain, myalgias and neck pain.  Skin: Negative.  Negative for itching and rash.  Neurological: Negative.  Negative for dizziness, tremors, sensory change, speech change, focal weakness, weakness and headaches.  Endo/Heme/Allergies: Does not bruise/bleed easily.       HYPOthyroidism on levothyroxine. Hot flashes.  Psychiatric/Behavioral: Negative.  Negative for depression and memory loss. The patient is not nervous/anxious and does not have insomnia.   All other systems reviewed and are negative.  Performance status (ECOG): 0  Physical Exam  Constitutional: She is oriented to person, place, and time. She appears well-developed and well-nourished. No distress.  HENT:  Head: Normocephalic and atraumatic.  Long black hair.  Eyes: Conjunctivae and EOM are normal. No scleral icterus.  Glasses.  Brown eyes.  Neurological: She is alert and oriented to person, place, and time.  Skin: She is not diaphoretic.  Psychiatric: She has a normal mood and affect. Her behavior is normal. Thought content normal.  Nursing note reviewed.   No visits with results within 3 Day(s) from this visit.  Latest known visit with results is:  Office Visit on 05/07/2018  Component Date Value Ref Range Status  .  Glucose 05/12/2018 106* 65 - 99 mg/dL Final  . BUN 05/12/2018 18  6 - 24 mg/dL Final  . Creatinine, Ser 05/12/2018 0.74  0.57 - 1.00 mg/dL Final  . GFR calc non Af Amer 05/12/2018 93  >59 mL/min/1.73 Final  . GFR calc Af Amer 05/12/2018 108  >59 mL/min/1.73 Final  . BUN/Creatinine Ratio 05/12/2018 24* 9 - 23 Final  . Sodium 05/12/2018 143  134 - 144 mmol/L Final  . Potassium 05/12/2018 4.2  3.5 - 5.2 mmol/L Final  . Chloride 05/12/2018 105  96 - 106 mmol/L Final  . CO2 05/12/2018 23  20 - 29 mmol/L Final  . Calcium 05/12/2018 9.8  8.7 - 10.2 mg/dL Final  . WBC 05/12/2018 5.7  3.4 - 10.8 x10E3/uL Final  . RBC 05/12/2018 5.69* 3.77 - 5.28 x10E6/uL Final  . Hemoglobin 05/12/2018 11.5  11.1 - 15.9 g/dL Final  . Hematocrit 05/12/2018 36.7  34.0 - 46.6 % Final  . MCV 05/12/2018 65* 79 - 97 fL Final  . MCH 05/12/2018 20.2* 26.6 - 33.0 pg Final  . MCHC 05/12/2018 31.3* 31.5 - 35.7 g/dL Final  . RDW 05/12/2018 19.3* 11.7 - 15.4 % Final  . Platelets 05/12/2018 283  150 - 450 x10E3/uL Final  .  Neutrophils 05/12/2018 68  Not Estab. % Final  . Lymphs 05/12/2018 21  Not Estab. % Final  . Monocytes 05/12/2018 7  Not Estab. % Final  . Eos 05/12/2018 2  Not Estab. % Final  . Basos 05/12/2018 2  Not Estab. % Final  . Neutrophils Absolute 05/12/2018 3.8  1.4 - 7.0 x10E3/uL Final  . Lymphocytes Absolute 05/12/2018 1.2  0.7 - 3.1 x10E3/uL Final  . Monocytes Absolute 05/12/2018 0.4  0.1 - 0.9 x10E3/uL Final  . EOS (ABSOLUTE) 05/12/2018 0.1  0.0 - 0.4 x10E3/uL Final  . Basophils Absolute 05/12/2018 0.1  0.0 - 0.2 x10E3/uL Final  . Immature Granulocytes 05/12/2018 0  Not Estab. % Final  . Immature Grans (Abs) 05/12/2018 0.0  0.0 - 0.1 x10E3/uL Final    Assessment:  Beverly Campbell is a 54 y.o. female  from [place] with a long standing history of anemia.  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  Oral iron causes diarrhea.  EGD on  01/01/2016 was normal.  Duodenal biopsy was negative   Colonoscopy on 01/01/2016 revealed diverticulosis in the entire colon and non-bleeding internal hemorrhoids.  She denies any melena or hematochezia.  Diet is good.  She has a history of of ice pica.  Normal labs on 08/24/2015 and 09/11/2016 included:  B12, folate, and TSH.  von Willebrand panel iwas normal on 08/18/2017.  She received Feraheme 1020 mg on 04/05/2013.  Ferritin improved from 22 to 273.  Hematocrit increased to 32.3.  Microcytic indices persisted.  She received Feraheme 510 mg on 09/20/2015, 04/17/2016,  04/23/2016, 03/19/2017, and 08/20/2017.  Ferritin has been followed: 8 on 08/24/2015, 98 on 10/12/2015, 6 on 03/01/2016, 5 on 04/17/2016, 77 on 06/03/2016, 11 on 09/11/2016,  45 on 12/20/2016, 9 on 03/17/2017, 24 on 08/18/2017, 124 on 02/04/2018, and 86 on 02/22/2019.  Symptomatically, she feels good.  She has lingering URI symptoms.  She denies any bleeding.  Plan: 1.   Review [facility] labs from 02/22/2019.  2.   Iron deficiency anemia  Hematocrit 37.8.  Hemoglobin 12.0.  MCV 65.0. Ferritin 86  (normal). No Venofer needed. 3.   Microcytic RBC indices             Patient has hemoglobin E disease.  She has chronic microcytic RBC indices.   No intervention needed. 4.   [facility] lab slip in 6 months and 1 year (CBC with diff, ferritin).   5.   RTC in 1 year for MD assessment and review of [facility] labs.   I discussed the assessment and treatment plan with the patient.  The patient was provided an opportunity to ask questions and all were answered.  The patient agreed with the plan and demonstrated an understanding of the instructions.  The patient was advised to call back if the symptoms worsen or if the condition fails to improve as anticipated.   Lequita Asal, MD, PhD    02/25/2019, 11:24 AM  I, Selena Batten, am acting as a scribe for Lequita Asal, MD.  I, Weston Mike Gip, MD, have reviewed the above  documentation for accuracy and completeness, and I agree with the above.

## 2019-02-25 ENCOUNTER — Telehealth: Payer: Self-pay

## 2019-02-25 ENCOUNTER — Encounter: Payer: Self-pay | Admitting: Hematology and Oncology

## 2019-02-25 ENCOUNTER — Inpatient Hospital Stay: Payer: Managed Care, Other (non HMO) | Attending: Hematology and Oncology | Admitting: Hematology and Oncology

## 2019-02-25 DIAGNOSIS — D582 Other hemoglobinopathies: Secondary | ICD-10-CM

## 2019-02-25 DIAGNOSIS — D509 Iron deficiency anemia, unspecified: Secondary | ICD-10-CM | POA: Diagnosis not present

## 2019-02-25 NOTE — Progress Notes (Signed)
No new changes noted today. The patient Name and DOB has been verified by phone today. 

## 2019-02-25 NOTE — Telephone Encounter (Signed)
New lab orders faxed over to [facility] ( [place]) [phone]. fax conformation cofirmed.

## 2019-03-01 ENCOUNTER — Telehealth: Payer: Self-pay

## 2019-03-01 NOTE — Telephone Encounter (Signed)
Called lmom informing patient of appointment on 03/03/2019. klh °

## 2019-03-01 NOTE — Telephone Encounter (Signed)
CONFIRMED AND SCREENED WITH PATIENT FOR 03-03-19 OV.

## 2019-03-03 ENCOUNTER — Ambulatory Visit: Payer: Managed Care, Other (non HMO) | Admitting: Internal Medicine

## 2019-03-09 ENCOUNTER — Other Ambulatory Visit: Payer: Self-pay

## 2019-03-09 MED ORDER — LEVOTHYROXINE SODIUM 100 MCG PO TABS: 100.0000 ug | ORAL_TABLET | Freq: Every day | ORAL | 1 refills | Status: DC

## 2019-03-15 ENCOUNTER — Telehealth: Payer: Self-pay

## 2019-03-15 NOTE — Telephone Encounter (Signed)
Confirmed appointment on 03/17/2019 and screened for covid. klh 

## 2019-03-17 ENCOUNTER — Other Ambulatory Visit: Payer: Self-pay

## 2019-03-17 ENCOUNTER — Telehealth: Payer: Self-pay

## 2019-03-17 ENCOUNTER — Ambulatory Visit: Payer: Managed Care, Other (non HMO) | Admitting: Internal Medicine

## 2019-03-17 DIAGNOSIS — R0602 Shortness of breath: Secondary | ICD-10-CM | POA: Diagnosis not present

## 2019-03-17 LAB — PULMONARY FUNCTION TEST

## 2019-03-17 NOTE — Telephone Encounter (Signed)
Called lmom informing patient of appointment on 03/22/2019. klh

## 2019-03-20 NOTE — Procedures (Signed)
[place], [address]  DATE OF SERVICE: March 17, 2019  Complete Pulmonary Function Testing Interpretation:  FINDINGS:  Forced vital capacity is moderately decreased.  FEV1 is 1.34 L which is 62% of predicted and is moderately decreased.  Postbronchodilator there is significant improvement in the FEV1.  FEV1 FVC ratio is normal.  Total lung capacity is mildly decreased residual volume is mildly decreased residual antilung capacity ratio is increased FRC is decreased.  The DLCO was within normal limits.  Should be noted patient had difficulty with maneuvers required for the test  IMPRESSION:  This pulmonary function study is suggestive of mild restrictive lung disease.  Patient did have some difficulty performing the test so clinical correlation is strongly recommended  Allyne Gee, MD [facility] Pulmonary Critical Care Medicine Sleep Medicine

## 2019-03-22 ENCOUNTER — Encounter: Payer: Self-pay | Admitting: Adult Health

## 2019-03-22 ENCOUNTER — Ambulatory Visit: Payer: Managed Care, Other (non HMO) | Admitting: Adult Health

## 2019-03-22 ENCOUNTER — Other Ambulatory Visit: Payer: Self-pay

## 2019-03-22 VITALS — BP 121/87 | HR 73 | Temp 97.7°F | Resp 16 | Ht 60.0 in | Wt 181.0 lb

## 2019-03-22 DIAGNOSIS — R059 Cough, unspecified: Secondary | ICD-10-CM

## 2019-03-22 DIAGNOSIS — R05 Cough: Secondary | ICD-10-CM | POA: Diagnosis not present

## 2019-03-22 DIAGNOSIS — R0602 Shortness of breath: Secondary | ICD-10-CM

## 2019-03-22 DIAGNOSIS — Z1231 Encounter for screening mammogram for malignant neoplasm of breast: Secondary | ICD-10-CM

## 2019-03-22 MED ORDER — MONTELUKAST SODIUM 10 MG PO TABS: 10.0000 mg | ORAL_TABLET | Freq: Every day | ORAL | 1 refills | Status: DC

## 2019-03-22 NOTE — Progress Notes (Signed)
error 

## 2019-03-22 NOTE — Progress Notes (Signed)
[facility] [place], [place] [address]  Internal MEDICINE  Office Visit Note  Patient Name: Beverly Campbell  [id number]  [id number]  Date of Service: 03/22/2019  Chief Complaint  Patient presents with  . Follow-up    pft results  . Quality Metric Gaps    mammogram needed    HPI  Patient is here for routine follow-up for chronic congestion and cough. She has been struggling with these issues for a year now. She continues to complain of a productive cough that is persistent, lasting several hours at times. She has coughed up mucous as well as what she describes as hard, white balls. She has had 2 chest x-rays that did not reveal any pulmonary findings. She has been on a course of azithromycin as well as a course of Levaquin, with no improvement in symptoms. She was also tried on albuterol inhaler as well as oral methylprednisolone. She says during her course of Levaquin and Methylprednisolone combined her coughing improved but continued to have the congestion in her chest, once the course of medication was finished her symptoms returned. 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 She reports exertional dyspnea as well as having coughing spells with exertion. She denies chest pain at this time.     Current Medication: Outpatient Encounter Medications as of 03/22/2019  Medication Sig  . levothyroxine (SYNTHROID) 100 MCG tablet Take 1 tablet (100 mcg total) by mouth daily.  . montelukast (SINGULAIR) 10 MG tablet Take 1 tablet (10 mg total) by mouth at bedtime.  . Multiple Vitamins-Iron (MULTIVITAMIN/IRON PO) Take by mouth.  . [DISCONTINUED] montelukast (SINGULAIR) 10 MG tablet Take 1 tablet (10 mg total) by mouth at bedtime.   No facility-administered encounter medications on file as of 03/22/2019.    Surgical History: Past Surgical History:  Procedure Laterality Date  . COLONOSCOPY WITH PROPOFOL N/A  01/01/2016   Procedure: COLONOSCOPY WITH PROPOFOL;  Surgeon: Jonathon Bellows, MD;  Location: [facility] ENDOSCOPY;  Service: Endoscopy;  Laterality: N/A;  . ESOPHAGOGASTRODUODENOSCOPY (EGD) WITH PROPOFOL N/A 01/01/2016   Procedure: ESOPHAGOGASTRODUODENOSCOPY (EGD) WITH PROPOFOL;  Surgeon: Jonathon Bellows, MD;  Location: [facility] ENDOSCOPY;  Service: Endoscopy;  Laterality: N/A;  . THYROID SURGERY  1997   had thyroid removed  . TUBAL LIGATION  1995    Medical History: Past Medical History:  Diagnosis Date  . Anemia   . Hypothyroidism   . Thyroid disease     Family History: Family History  Problem Relation Age of Onset  . Colon cancer Father   . Hypertension Mother   . Diabetes Mother     Social History   Socioeconomic History  . Marital status: Divorced    Spouse name: Not on file  . Number of children: Not on file  . Years of education: Not on file  . Highest education level: Not on file  Occupational History  . Not on file  Tobacco Use  . Smoking status: Never Smoker  . Smokeless tobacco: Never Used  Substance and Sexual Activity  . Alcohol use: Yes    Comment: occasionally  . Drug use: No  . Sexual activity: Yes    Birth control/protection: None  Other Topics Concern  . Not on file  Social History Narrative  . Not on file   Social Determinants of Health   Financial Resource Strain:   . Difficulty of Paying Living Expenses: Not on file  Food Insecurity:   . Worried  About Running Out of Food in the Last Year: Not on file  . Ran Out of Food in the Last Year: Not on file  Transportation Needs:   . Lack of Transportation (Medical): Not on file  . Lack of Transportation (Non-Medical): Not on file  Physical Activity:   . Days of Exercise per Week: Not on file  . Minutes of Exercise per Session: Not on file  Stress:   . Feeling of Stress : Not on file  Social Connections:   . Frequency of Communication with Friends and Family: Not on file  . Frequency of Social Gatherings with  Friends and Family: Not on file  . Attends Religious Services: Not on file  . Active Member of Clubs or Organizations: Not on file  . Attends Archivist Meetings: Not on file  . Marital Status: Not on file  Intimate Partner Violence:   . Fear of Current or Ex-Partner: Not on file  . Emotionally Abused: Not on file  . Physically Abused: Not on file  . Sexually Abused: Not on file      Review of Systems  Constitutional: Negative for chills, fatigue and unexpected weight change.  HENT: Positive for congestion. Negative for rhinorrhea, sneezing and sore throat.   Eyes: Negative for photophobia, pain and redness.  Respiratory: Positive for cough, shortness of breath and wheezing. Negative for chest tightness.   Cardiovascular: Negative for chest pain and palpitations.  Gastrointestinal: Negative for abdominal pain, constipation, diarrhea, nausea and vomiting.  Endocrine: Negative.   Genitourinary: Negative for dysuria and frequency.  Musculoskeletal: Negative for arthralgias, back pain, joint swelling and neck pain.  Skin: Negative for rash.  Allergic/Immunologic: Negative.   Neurological: Negative for tremors and numbness.  Hematological: Negative for adenopathy. Does not bruise/bleed easily.  Psychiatric/Behavioral: Negative for behavioral problems and sleep disturbance. The patient is not nervous/anxious.     Vital Signs: BP 121/87   Pulse 73   Temp 97.7 F (36.5 C)   Resp 16   Ht 5' (1.524 m)   Wt 181 lb (82.1 kg)   LMP 04/26/2018 (Exact Date) Comment: premenopausal  SpO2 96%   BMI 35.35 kg/m    Physical Exam Vitals and nursing note reviewed.  Constitutional:      General: She is not in acute distress.    Appearance: She is well-developed. She is not diaphoretic.  HENT:     Head: Normocephalic and atraumatic.     Mouth/Throat:     Pharynx: No oropharyngeal exudate.  Eyes:     Pupils: Pupils are equal, round, and reactive to light.  Neck:     Thyroid:  No thyromegaly.     Vascular: No JVD.     Trachea: No tracheal deviation.  Cardiovascular:     Rate and Rhythm: Normal rate and regular rhythm.     Heart sounds: Normal heart sounds. No murmur. No friction rub. No gallop.   Pulmonary:     Effort: Pulmonary effort is normal. No respiratory distress.     Breath sounds: Normal breath sounds. No wheezing or rales.     Comments: Coarse crackles at base of bilateral lungs. Diminished lung sounds throughout. Chest:     Chest wall: No tenderness.  Abdominal:     Palpations: Abdomen is soft.     Tenderness: There is no abdominal tenderness. There is no guarding.  Musculoskeletal:        General: Normal range of motion.     Cervical back: Normal range of  motion and neck supple.  Lymphadenopathy:     Cervical: No cervical adenopathy.  Skin:    General: Skin is warm and dry.  Neurological:     Mental Status: She is alert and oriented to person, place, and time.     Cranial Nerves: No cranial nerve deficit.  Psychiatric:        Behavior: Behavior normal.        Thought Content: Thought content normal.        Judgment: Judgment normal.     Assessment/Plan: 1. Shortness of breath Complaints of cough and congestion for a year with no relief in symptoms with antibiotics or steroid. 2 chest x-rays were negative for pulmonary findings. Will review CT chest, has potential exposure to chemicals from working as Sports coach. - [place]; Future  2. Cough Chronic cough lasting a year, with no relief from antibiotics or steroids. Continue with singulair at this time and continue to monitor. - CT CHEST WO CONTRAST; Future - montelukast (SINGULAIR) 10 MG tablet; Take 1 tablet (10 mg total) by mouth at bedtime.  Dispense: 30 tablet; Refill: 1  3. Encounter for screening mammogram for malignant neoplasm of breast Scheduled for annual physical exam with PAP. Up to date on other PHM. - MM DIGITAL SCREENING BILATERAL; Future  General  Counseling: Keira verbalizes understanding of the findings of todays visit and agrees with plan of treatment. I have discussed any further diagnostic evaluation that may be needed or ordered today. We also reviewed her medications today. she has been encouraged to call the office with any questions or concerns that should arise related to todays visit.    Orders Placed This Encounter  Procedures  . CT CHEST WO CONTRAST  . MM 3D SCREEN BREAST BILATERAL    Meds ordered this encounter  Medications  . montelukast (SINGULAIR) 10 MG tablet    Sig: Take 1 tablet (10 mg total) by mouth at bedtime.    Dispense:  30 tablet    Refill:  1    Time spent:25 Minutes   This patient was seen by Orson Gear AGNP-C in Collaboration with Dr Lavera Guise as a part of collaborative care agreement     Kendell Bane AGNP-C Internal medicine

## 2019-04-13 ENCOUNTER — Telehealth: Payer: Self-pay

## 2019-04-15 ENCOUNTER — Telehealth: Payer: Self-pay

## 2019-04-15 NOTE — Telephone Encounter (Signed)
CONFIRMED AND SCREENED FOR 04-20-19 OV.

## 2019-04-16 ENCOUNTER — Ambulatory Visit
Admission: RE | Admit: 2019-04-16 | Discharge: 2019-04-16 | Disposition: A | Discharge status: Home / Self Care | Payer: Managed Care, Other (non HMO) | Source: Ambulatory Visit | Attending: Adult Health | Admitting: Adult Health

## 2019-04-16 ENCOUNTER — Other Ambulatory Visit: Payer: Self-pay | Admitting: Adult Health

## 2019-04-16 DIAGNOSIS — Z1231 Encounter for screening mammogram for malignant neoplasm of breast: Secondary | ICD-10-CM | POA: Diagnosis present

## 2019-04-16 DIAGNOSIS — R0602 Shortness of breath: Secondary | ICD-10-CM

## 2019-04-16 NOTE — Telephone Encounter (Signed)
Patient needs to return call, her insurance is requiring chest x ray before approving ct scan, her voice mail is full and unable to leave message. Beth

## 2019-04-20 ENCOUNTER — Other Ambulatory Visit: Payer: Managed Care, Other (non HMO) | Admitting: Nurse Practitioner

## 2019-05-11 ENCOUNTER — Telehealth: Payer: Self-pay

## 2019-05-11 NOTE — Telephone Encounter (Signed)
Confirmed and screened for 05-13-19 ov. 

## 2019-05-13 ENCOUNTER — Other Ambulatory Visit: Payer: Self-pay

## 2019-05-13 ENCOUNTER — Ambulatory Visit (INDEPENDENT_AMBULATORY_CARE_PROVIDER_SITE_OTHER): Payer: Managed Care, Other (non HMO) | Admitting: Nurse Practitioner

## 2019-05-13 ENCOUNTER — Encounter: Payer: Self-pay | Admitting: Nurse Practitioner

## 2019-05-13 ENCOUNTER — Ambulatory Visit
Admission: RE | Admit: 2019-05-13 | Discharge: 2019-05-13 | Disposition: A | Discharge status: Home / Self Care | Payer: Managed Care, Other (non HMO) | Source: Ambulatory Visit | Attending: Adult Health | Admitting: Adult Health

## 2019-05-13 VITALS — BP 122/84 | HR 79 | Temp 97.3°F | Resp 16 | Ht 60.0 in | Wt 180.4 lb

## 2019-05-13 DIAGNOSIS — Z124 Encounter for screening for malignant neoplasm of cervix: Secondary | ICD-10-CM | POA: Diagnosis not present

## 2019-05-13 DIAGNOSIS — R0602 Shortness of breath: Secondary | ICD-10-CM | POA: Insufficient documentation

## 2019-05-13 DIAGNOSIS — Z0001 Encounter for general adult medical examination with abnormal findings: Secondary | ICD-10-CM | POA: Diagnosis not present

## 2019-05-13 DIAGNOSIS — E039 Hypothyroidism, unspecified: Secondary | ICD-10-CM

## 2019-05-13 DIAGNOSIS — R062 Wheezing: Secondary | ICD-10-CM | POA: Diagnosis not present

## 2019-05-13 DIAGNOSIS — R300 Dysuria: Secondary | ICD-10-CM

## 2019-05-13 MED ORDER — ALBUTEROL SULFATE (5 MG/ML) 0.5% IN NEBU: 2.5000 mg | INHALATION_SOLUTION | Freq: Four times a day (QID) | RESPIRATORY_TRACT | 12 refills | Status: DC | PRN

## 2019-05-13 NOTE — Progress Notes (Signed)
[facility] [place], [place] [address]  Internal MEDICINE  Office Visit Note  Patient Name: Beverly Campbell  [id number]  [id number]  Date of Service: 05/23/2019  Chief Complaint  Patient presents with  . Annual Exam  . Gynecologic Exam  . Hypothyroidism     The patient presents for health maintenance exam and pap smear. She continues to complain of wheezing and shortness of breath. Has been going on, intermittently for several months, and gradually has become worse. She states that she has tried to use rescue inhaler in the past and this has not really helped. She does not feel like she gets the medication into the lungs. Chest x-ray, done previously, has indicated that patient has chronic bronchitis or other reactive airway disease. At her last visit, a chest CT was ordered. This has not yet been scheduled. Will look into this and make sure this study is scheduled as soon as possible. Otherwise, she states that she is doing well. She is due to have routine, fasting labs. She had mammogram 04/16/2019 and it was negative .  Pt is here for routine health maintenance examination  Current Medication: Outpatient Encounter Medications as of 05/13/2019  Medication Sig  . levothyroxine (SYNTHROID) 100 MCG tablet Take 1 tablet (100 mcg total) by mouth daily.  . Multiple Vitamins-Iron (MULTIVITAMIN/IRON PO) Take by mouth.  . [DISCONTINUED] montelukast (SINGULAIR) 10 MG tablet Take 1 tablet (10 mg total) by mouth at bedtime.  Marland Kitchen albuterol (PROVENTIL) (5 MG/ML) 0.5% nebulizer solution Take 0.5 mLs (2.5 mg total) by nebulization every 6 (six) hours as needed for wheezing or shortness of breath.   No facility-administered encounter medications on file as of 05/13/2019.    Surgical History: Past Surgical History:  Procedure Laterality Date  . COLONOSCOPY WITH PROPOFOL N/A 01/01/2016   Procedure: COLONOSCOPY WITH PROPOFOL;  Surgeon: Jonathon Bellows, MD;  Location:  [facility] ENDOSCOPY;  Service: Endoscopy;  Laterality: N/A;  . ESOPHAGOGASTRODUODENOSCOPY (EGD) WITH PROPOFOL N/A 01/01/2016   Procedure: ESOPHAGOGASTRODUODENOSCOPY (EGD) WITH PROPOFOL;  Surgeon: Jonathon Bellows, MD;  Location: [facility] ENDOSCOPY;  Service: Endoscopy;  Laterality: N/A;  . THYROID SURGERY  1997   had thyroid removed  . TUBAL LIGATION  1995    Medical History: Past Medical History:  Diagnosis Date  . Anemia   . Hypothyroidism   . Thyroid disease     Family History: Family History  Problem Relation Age of Onset  . Colon cancer Father   . Hypertension Mother   . Diabetes Mother       Review of Systems  Constitutional: Positive for fatigue. Negative for chills and unexpected weight change.  HENT: Negative for congestion, rhinorrhea, sinus pressure, sinus pain, sneezing and sore throat.   Respiratory: Positive for cough, shortness of breath and wheezing. Negative for chest tightness.   Cardiovascular: Negative for chest pain and palpitations.  Gastrointestinal: Negative for abdominal pain, constipation, diarrhea, nausea and vomiting.  Endocrine: Negative for cold intolerance, heat intolerance, polydipsia and polyuria.  Genitourinary: Negative for dysuria, frequency, hematuria and urgency.  Musculoskeletal: Negative for arthralgias, back pain, joint swelling and neck pain.  Skin: Negative for rash.  Allergic/Immunologic: Positive for environmental allergies.  Neurological: Negative for dizziness, tremors, numbness and headaches.  Hematological: Negative for adenopathy. Does not bruise/bleed easily.  Psychiatric/Behavioral: Negative for behavioral problems and sleep disturbance. The patient is not nervous/anxious.      Today's Vitals   05/13/19 1022  BP: 122/84  Pulse: 79  Resp: 16  Temp: (!)  97.3 F (36.3 C)  SpO2: 96%  Weight: 180 lb 5.8 oz (81.8 kg)  Height: 5' (1.524 m)   Body mass index is 35.22 kg/m.  Physical Exam Vitals and nursing note reviewed.   Constitutional:      General: She is not in acute distress.    Appearance: Normal appearance. She is well-developed. She is not diaphoretic.  HENT:     Head: Normocephalic and atraumatic.     Nose: Nose normal.     Mouth/Throat:     Pharynx: No oropharyngeal exudate.  Eyes:     Pupils: Pupils are equal, round, and reactive to light.  Neck:     Thyroid: No thyromegaly.     Vascular: No carotid bruit or JVD.     Trachea: No tracheal deviation.  Cardiovascular:     Rate and Rhythm: Normal rate and regular rhythm.     Pulses: Normal pulses.     Heart sounds: Normal heart sounds. No murmur. No friction rub. No gallop.   Pulmonary:     Effort: Pulmonary effort is normal. No respiratory distress.     Breath sounds: Wheezing and rhonchi present. No rales.  Chest:     Chest wall: No tenderness.     Breasts:        Right: Normal. No swelling, bleeding, inverted nipple, mass, nipple discharge, skin change or tenderness.        Left: Normal. No swelling, bleeding, inverted nipple, mass, nipple discharge, skin change or tenderness.  Abdominal:     General: Bowel sounds are normal.     Palpations: Abdomen is soft.     Tenderness: There is no abdominal tenderness.  Genitourinary:    General: Normal vulva.     Exam position: Supine.     Labia:        Right: No tenderness or lesion.        Left: No tenderness or lesion.      Vagina: Normal. No vaginal discharge, tenderness or bleeding.     Cervix: No cervical motion tenderness, discharge or erythema.     Uterus: Normal.      Adnexa: Right adnexa normal and left adnexa normal.     Comments: No tenderness, masses, or organomeglay present during bimanual exam . Musculoskeletal:        General: Normal range of motion.     Cervical back: Normal range of motion and neck supple.  Lymphadenopathy:     Cervical: No cervical adenopathy.     Upper Body:     Right upper body: No axillary adenopathy.     Left upper body: No axillary adenopathy.      Lower Body: No right inguinal adenopathy. No left inguinal adenopathy.  Skin:    General: Skin is warm and dry.  Neurological:     Mental Status: She is alert and oriented to person, place, and time.     Cranial Nerves: No cranial nerve deficit.  Psychiatric:        Mood and Affect: Mood normal.        Behavior: Behavior normal.        Thought Content: Thought content normal.        Judgment: Judgment normal.    LABS: Recent Results (from the past 2160 hour(s))  Urinalysis, Routine w reflex microscopic     Status: None   Collection Time: 05/13/19 10:21 AM  Result Value Ref Range   Specific Gravity, UA 1.021 1.005 - 1.030   pH, UA 5.0  5.0 - 7.5   Color, UA Yellow Yellow   Appearance Ur Clear Clear   Leukocytes,UA Negative Negative   Protein,UA Negative Negative/Trace   Glucose, UA Negative Negative   Ketones, UA Negative Negative   RBC, UA Negative Negative   Bilirubin, UA Negative Negative   Urobilinogen, Ur 0.2 0.2 - 1.0 mg/dL   Nitrite, UA Negative Negative   Microscopic Examination Comment     Comment: Microscopic not indicated and not performed.  IGP, Aptima HPV     Status: None   Collection Time: 05/13/19 10:21 AM  Result Value Ref Range   Interpretation NILM     Comment: NEGATIVE FOR INTRAEPITHELIAL LESION OR MALIGNANCY.   Category NIL     Comment: Negative for Intraepithelial Lesion   Adequacy SECNI     Comment: Satisfactory for evaluation. No endocervical component is identified.   Clinician Provided ICD10 Comment     Comment: Z12.4   Performed by: Comment     Comment: Clyde Canterbury, Cytotechnologist (ASCP)   Note: Comment     Comment: The Pap smear is a screening test designed to aid in the detection of premalignant and malignant conditions of the uterine cervix.  It is not a diagnostic procedure and should not be used as the sole means of detecting cervical cancer.  Both false-positive and false-negative reports do occur.    Test Methodology Comment      Comment: This liquid based ThinPrep(R) pap test was screened with the use of an image guided system.    HPV Aptima Negative Negative    Comment: This nucleic acid amplification test detects fourteen high-risk HPV types (16,18,31,33,35,39,45,51,52,56,58,59,66,68) without differentiation.    Assessment/Plan:  1. Encounter for general adult medical examination with abnormal findings Annual health maintenance exam and pap smear today. Lab slip given to check routine, fasting labs.   2. Shortness of breath Nebulizer given to patient. Albuterol nebulizer solution should be used per nebulizer up to four times daily as needed for shortness of breath and wheezing. Will check order for Chest CT.  - For home use only DME Nebulizer machine  3. Wheezing Nebulizer given to patient. Albuterol nebulizer solution should be used per nebulizer up to four times daily as needed for shortness of breath and wheezing. Will check order for Chest CT.  - albuterol (PROVENTIL) (5 MG/ML) 0.5% nebulizer solution; Take 0.5 mLs (2.5 mg total) by nebulization every 6 (six) hours as needed for wheezing or shortness of breath.  Dispense: 20 mL; Refill: 12 - For home use only DME Nebulizer machine  4. Acquired hypothyroidism Check thyroid panel and adjust levothyroxine as indicated.   5. Routine cervical smear - IGP, Aptima HPV  6. Dysuria - Urinalysis, Routine w reflex microscopic   General Counseling: Tanasha verbalizes understanding of the findings of todays visit and agrees with plan of treatment. I have discussed any further diagnostic evaluation that may be needed or ordered today. We also reviewed her medications today. she has been encouraged to call the office with any questions or concerns that should arise related to todays visit.    Counseling:  This patient was seen by Leretha Pol FNP Collaboration with Dr Lavera Guise as a part of collaborative care agreement  Orders Placed This  Encounter  Procedures  . For home use only DME Nebulizer machine  . Urinalysis, Routine w reflex microscopic    Meds ordered this encounter  Medications  . albuterol (PROVENTIL) (5 MG/ML) 0.5% nebulizer solution    Sig:  Take 0.5 mLs (2.5 mg total) by nebulization every 6 (six) hours as needed for wheezing or shortness of breath.    Dispense:  20 mL    Refill:  12    Order Specific Question:   Supervising Provider    Answer:   Lavera Guise [id number]    Total time spent: 109 Minutes  Time spent includes review of chart, medications, test results, and follow up plan with the patient.     Lavera Guise, MD  Internal Medicine

## 2019-05-14 ENCOUNTER — Other Ambulatory Visit: Payer: Self-pay | Admitting: Adult Health

## 2019-05-14 DIAGNOSIS — R059 Cough, unspecified: Secondary | ICD-10-CM

## 2019-05-14 DIAGNOSIS — R05 Cough: Secondary | ICD-10-CM

## 2019-05-14 LAB — URINALYSIS, ROUTINE W REFLEX MICROSCOPIC
Bilirubin, UA: NEGATIVE
Glucose, UA: NEGATIVE
Ketones, UA: NEGATIVE
Leukocytes,UA: NEGATIVE
Nitrite, UA: NEGATIVE
Protein,UA: NEGATIVE
RBC, UA: NEGATIVE
Specific Gravity, UA: 1.021 (ref 1.005–1.030)
Urobilinogen, Ur: 0.2 mg/dL (ref 0.2–1.0)
pH, UA: 5 (ref 5.0–7.5)

## 2019-05-18 ENCOUNTER — Telehealth: Payer: Self-pay

## 2019-05-18 LAB — IGP, APTIMA HPV: HPV Aptima: NEGATIVE

## 2019-05-18 NOTE — Telephone Encounter (Signed)
PT NOTIFIED  

## 2019-05-18 NOTE — Progress Notes (Signed)
Please let the patient know that her pap smear is normal. Thanks.

## 2019-05-18 NOTE — Telephone Encounter (Signed)
-----   Message from Ronnell Freshwater, NP sent at 05/18/2019 11:12 AM EDT ----- Please let the patient know that her pap smear is normal. Thanks.

## 2019-05-18 NOTE — Progress Notes (Signed)
Lmom to call back. 

## 2019-05-20 ENCOUNTER — Other Ambulatory Visit: Payer: Self-pay | Admitting: Nurse Practitioner

## 2019-05-21 LAB — CBC
Hematocrit: 39.4 % (ref 34.0–46.6)
Hemoglobin: 12.6 g/dL (ref 11.1–15.9)
MCH: 20 pg — ABNORMAL LOW (ref 26.6–33.0)
MCHC: 32 g/dL (ref 31.5–35.7)
MCV: 63 fL — ABNORMAL LOW (ref 79–97)
Platelets: 223 10*3/uL (ref 150–450)
RBC: 6.3 x10E6/uL — ABNORMAL HIGH (ref 3.77–5.28)
RDW: 18.5 % — ABNORMAL HIGH (ref 11.7–15.4)
WBC: 4.5 10*3/uL (ref 3.4–10.8)

## 2019-05-21 LAB — COMPREHENSIVE METABOLIC PANEL
ALT: 14 IU/L (ref 0–32)
AST: 17 IU/L (ref 0–40)
Albumin/Globulin Ratio: 1.7 (ref 1.2–2.2)
Albumin: 4.3 g/dL (ref 3.8–4.9)
Alkaline Phosphatase: 79 IU/L (ref 39–117)
BUN/Creatinine Ratio: 15 (ref 9–23)
BUN: 13 mg/dL (ref 6–24)
Bilirubin Total: 0.8 mg/dL (ref 0.0–1.2)
CO2: 24 mmol/L (ref 20–29)
Calcium: 9.6 mg/dL (ref 8.7–10.2)
Chloride: 102 mmol/L (ref 96–106)
Creatinine, Ser: 0.84 mg/dL (ref 0.57–1.00)
GFR calc Af Amer: 92 mL/min/{1.73_m2} (ref >59)
GFR calc non Af Amer: 80 mL/min/{1.73_m2} (ref >59)
Globulin, Total: 2.6 g/dL (ref 1.5–4.5)
Glucose: 98 mg/dL (ref 65–99)
Potassium: 4.5 mmol/L (ref 3.5–5.2)
Sodium: 139 mmol/L (ref 134–144)
Total Protein: 6.9 g/dL (ref 6.0–8.5)

## 2019-05-21 LAB — LIPID PANEL WITH LDL/HDL RATIO
Cholesterol, Total: 189 mg/dL (ref 100–199)
HDL: 65 mg/dL (ref >39)
LDL Chol Calc (NIH): 105 mg/dL — ABNORMAL HIGH (ref 0–99)
LDL/HDL Ratio: 1.6 ratio (ref 0.0–3.2)
Triglycerides: 109 mg/dL (ref 0–149)
VLDL Cholesterol Cal: 19 mg/dL (ref 5–40)

## 2019-05-21 LAB — TSH: TSH: 0.971 u[IU]/mL (ref 0.450–4.500)

## 2019-05-21 LAB — VITAMIN D 25 HYDROXY (VIT D DEFICIENCY, FRACTURES): Vit D, 25-Hydroxy: 17.7 ng/mL — ABNORMAL LOW (ref 30.0–100.0)

## 2019-05-21 LAB — T4, FREE: Free T4: 1.7 ng/dL (ref 0.82–1.77)

## 2019-05-23 DIAGNOSIS — R0602 Shortness of breath: Secondary | ICD-10-CM | POA: Insufficient documentation

## 2019-05-23 DIAGNOSIS — Z0001 Encounter for general adult medical examination with abnormal findings: Secondary | ICD-10-CM | POA: Insufficient documentation

## 2019-05-23 DIAGNOSIS — Z124 Encounter for screening for malignant neoplasm of cervix: Secondary | ICD-10-CM | POA: Insufficient documentation

## 2019-05-28 ENCOUNTER — Other Ambulatory Visit: Payer: Self-pay | Admitting: Nurse Practitioner

## 2019-05-28 ENCOUNTER — Telehealth: Payer: Self-pay

## 2019-05-28 DIAGNOSIS — R05 Cough: Secondary | ICD-10-CM

## 2019-05-28 DIAGNOSIS — R059 Cough, unspecified: Secondary | ICD-10-CM

## 2019-05-28 NOTE — Telephone Encounter (Signed)
Called patient and left message advising pt of ct appt and mailed her instructions. Beverly Campbell

## 2019-06-08 ENCOUNTER — Other Ambulatory Visit: Payer: Self-pay

## 2019-06-08 ENCOUNTER — Ambulatory Visit
Admission: RE | Admit: 2019-06-08 | Discharge: 2019-06-08 | Disposition: A | Discharge status: Home / Self Care | Payer: Managed Care, Other (non HMO) | Source: Ambulatory Visit | Attending: Adult Health | Admitting: Adult Health

## 2019-06-08 DIAGNOSIS — R0602 Shortness of breath: Secondary | ICD-10-CM

## 2019-06-08 DIAGNOSIS — R059 Cough, unspecified: Secondary | ICD-10-CM

## 2019-06-08 DIAGNOSIS — R05 Cough: Secondary | ICD-10-CM | POA: Insufficient documentation

## 2019-06-17 ENCOUNTER — Telehealth: Payer: Self-pay

## 2019-06-17 NOTE — Telephone Encounter (Signed)
Called lmom informing patient of appointment on 06/22/2019. klh

## 2019-06-18 ENCOUNTER — Telehealth: Payer: Self-pay

## 2019-06-18 ENCOUNTER — Other Ambulatory Visit: Payer: Self-pay

## 2019-06-18 MED ORDER — ALBUTEROL SULFATE HFA 108 (90 BASE) MCG/ACT IN AERS: 2.0000 | INHALATION_SPRAY | Freq: Four times a day (QID) | RESPIRATORY_TRACT | 1 refills | Status: DC | PRN

## 2019-06-18 NOTE — Telephone Encounter (Signed)
Confirmed and screened for 06-22-19 ov.

## 2019-06-22 ENCOUNTER — Ambulatory Visit: Payer: Managed Care, Other (non HMO) | Admitting: Internal Medicine

## 2019-06-25 ENCOUNTER — Telehealth: Payer: Self-pay

## 2019-06-25 NOTE — Telephone Encounter (Signed)
Pt needs to return call, we lmom and mailed reminders for her appts. Beth

## 2019-07-02 ENCOUNTER — Telehealth: Payer: Self-pay

## 2019-07-02 NOTE — Telephone Encounter (Signed)
Called lmom informing patient of appointment on 07/06/2019. klh 

## 2019-07-06 ENCOUNTER — Encounter: Payer: Self-pay | Admitting: Internal Medicine

## 2019-07-06 ENCOUNTER — Ambulatory Visit: Payer: Managed Care, Other (non HMO) | Admitting: Internal Medicine

## 2019-07-06 ENCOUNTER — Other Ambulatory Visit: Payer: Self-pay

## 2019-07-06 VITALS — BP 129/83 | HR 80 | Temp 97.6°F | Resp 16 | Ht 60.0 in | Wt 179.2 lb

## 2019-07-06 DIAGNOSIS — J4520 Mild intermittent asthma, uncomplicated: Secondary | ICD-10-CM | POA: Diagnosis not present

## 2019-07-06 DIAGNOSIS — J9809 Other diseases of bronchus, not elsewhere classified: Secondary | ICD-10-CM

## 2019-07-06 DIAGNOSIS — T17500A Unspecified foreign body in bronchus causing asphyxiation, initial encounter: Secondary | ICD-10-CM

## 2019-07-06 DIAGNOSIS — R911 Solitary pulmonary nodule: Secondary | ICD-10-CM

## 2019-07-06 MED ORDER — BUDESONIDE-FORMOTEROL FUMARATE 80-4.5 MCG/ACT IN AERO: 2.0000 | INHALATION_SPRAY | Freq: Two times a day (BID) | RESPIRATORY_TRACT | 3 refills | Status: DC

## 2019-07-06 NOTE — Progress Notes (Signed)
[facility] [place], [place] [address]  Pulmonary Sleep Medicine   Office Visit Note  Patient Name: Beverly Campbell DOB: [birth year] MRN [id number]  Date of Service: 07/06/2019  Complaints/HPI: Asthma mucoid impaction she is doing a little bit better.  Has got mucoid impaction noted on the last imaging studies.  She continues to be on inhalers as to using the albuterol as well as the Singulair.  She may benefit from extra more aggressive pulmonary toileting.  With the findings of mucoid impaction I suggested that we also get a work-up for ABPA orders will be done today  ROS  General: (-) fever, (-) chills, (-) night sweats, (-) weakness Skin: (-) rashes, (-) itching,. Eyes: (-) visual changes, (-) redness, (-) itching. Nose and Sinuses: (-) nasal stuffiness or itchiness, (-) postnasal drip, (-) nosebleeds, (-) sinus trouble. Mouth and Throat: (-) sore throat, (-) hoarseness. Neck: (-) swollen glands, (-) enlarged thyroid, (-) neck pain. Respiratory: + cough, (-) bloody sputum, + shortness of breath, - wheezing. Cardiovascular: - ankle swelling, (-) chest pain. Lymphatic: (-) lymph node enlargement. Neurologic: (-) numbness, (-) tingling. Psychiatric: (-) anxiety, (-) depression   Current Medication: Outpatient Encounter Medications as of 07/06/2019  Medication Sig  . albuterol (PROVENTIL) (5 MG/ML) 0.5% nebulizer solution Take 0.5 mLs (2.5 mg total) by nebulization every 6 (six) hours as needed for wheezing or shortness of breath.  Marland Kitchen albuterol (VENTOLIN HFA) 108 (90 Base) MCG/ACT inhaler Inhale 2 puffs into the lungs every 6 (six) hours as needed for wheezing or shortness of breath.  . levothyroxine (SYNTHROID) 100 MCG tablet Take 1 tablet (100 mcg total) by mouth daily.  . montelukast (SINGULAIR) 10 MG tablet TAKE 1 TABLET BY MOUTH EVERYDAY AT BEDTIME  . Multiple Vitamins-Iron (MULTIVITAMIN/IRON PO) Take by mouth.   No facility-administered  encounter medications on file as of 07/06/2019.    Surgical History: Past Surgical History:  Procedure Laterality Date  . COLONOSCOPY WITH PROPOFOL N/A 01/01/2016   Procedure: COLONOSCOPY WITH PROPOFOL;  Surgeon: Jonathon Bellows, MD;  Location: [facility] ENDOSCOPY;  Service: Endoscopy;  Laterality: N/A;  . ESOPHAGOGASTRODUODENOSCOPY (EGD) WITH PROPOFOL N/A 01/01/2016   Procedure: ESOPHAGOGASTRODUODENOSCOPY (EGD) WITH PROPOFOL;  Surgeon: Jonathon Bellows, MD;  Location: [facility] ENDOSCOPY;  Service: Endoscopy;  Laterality: N/A;  . THYROID SURGERY  1997   had thyroid removed  . TUBAL LIGATION  1995    Medical History: Past Medical History:  Diagnosis Date  . Anemia   . Hypothyroidism   . Thyroid disease     Family History: Family History  Problem Relation Age of Onset  . Colon cancer Father   . Hypertension Mother   . Diabetes Mother     Social History: Social History   Socioeconomic History  . Marital status: Divorced    Spouse name: Not on file  . Number of children: Not on file  . Years of education: Not on file  . Highest education level: Not on file  Occupational History  . Not on file  Tobacco Use  . Smoking status: Never Smoker  . Smokeless tobacco: Never Used  Substance and Sexual Activity  . Alcohol use: Yes    Comment: occasionally  . Drug use: No  . Sexual activity: Yes    Birth control/protection: None  Other Topics Concern  . Not on file  Social History Narrative  . Not on file   Social Determinants of Health   Financial Resource Strain:   . Difficulty of Paying Living Expenses:  Food Insecurity:   . Worried About Charity fundraiser in the Last Year:   . Arboriculturist in the Last Year:   Transportation Needs:   . Film/video editor (Medical):   Marland Kitchen Lack of Transportation (Non-Medical):   Physical Activity:   . Days of Exercise per Week:   . Minutes of Exercise per Session:   Stress:   . Feeling of Stress :   Social Connections:   . Frequency of  Communication with Friends and Family:   . Frequency of Social Gatherings with Friends and Family:   . Attends Religious Services:   . Active Member of Clubs or Organizations:   . Attends Archivist Meetings:   Marland Kitchen Marital Status:   Intimate Partner Violence:   . Fear of Current or Ex-Partner:   . Emotionally Abused:   Marland Kitchen Physically Abused:   . Sexually Abused:     Vital Signs: Blood pressure 129/83, pulse 80, temperature 97.6 F (36.4 C), resp. rate 16, height 5' (1.524 m), weight 179 lb 3.2 oz (81.3 kg), SpO2 96 %, unknown if currently breastfeeding.  Examination: General Appearance: The patient is well-developed, well-nourished, and in no distress. Skin: Gross inspection of skin unremarkable. Head: normocephalic, no gross deformities. Eyes: no gross deformities noted. ENT: ears appear grossly normal no exudates. Neck: Supple. No thyromegaly. No LAD. Respiratory: few rhonchi. Cardiovascular: Normal S1 and S2 without murmur or rub. Extremities: No cyanosis. pulses are equal. Neurologic: Alert and oriented. No involuntary movements.  LABS: Recent Results (from the past 2160 hour(s))  Urinalysis, Routine w reflex microscopic     Status: None   Collection Time: 05/13/19 10:21 AM  Result Value Ref Range   Specific Gravity, UA 1.021 1.005 - 1.030   pH, UA 5.0 5.0 - 7.5   Color, UA Yellow Yellow   Appearance Ur Clear Clear   Leukocytes,UA Negative Negative   Protein,UA Negative Negative/Trace   Glucose, UA Negative Negative   Ketones, UA Negative Negative   RBC, UA Negative Negative   Bilirubin, UA Negative Negative   Urobilinogen, Ur 0.2 0.2 - 1.0 mg/dL   Nitrite, UA Negative Negative   Microscopic Examination Comment     Comment: Microscopic not indicated and not performed.  IGP, Aptima HPV     Status: None   Collection Time: 05/13/19 10:21 AM  Result Value Ref Range   Interpretation NILM     Comment: NEGATIVE FOR INTRAEPITHELIAL LESION OR MALIGNANCY.    Category NIL     Comment: Negative for Intraepithelial Lesion   Adequacy SECNI     Comment: Satisfactory for evaluation. No endocervical component is identified.   Clinician Provided ICD10 Comment     Comment: Z12.4   Performed by: Comment     Comment: Clyde Canterbury, Cytotechnologist (ASCP)   Note: Comment     Comment: The Pap smear is a screening test designed to aid in the detection of premalignant and malignant conditions of the uterine cervix.  It is not a diagnostic procedure and should not be used as the sole means of detecting cervical cancer.  Both false-positive and false-negative reports do occur.    Test Methodology Comment     Comment: This liquid based ThinPrep(R) pap test was screened with the use of an image guided system.    HPV Aptima Negative Negative    Comment: This nucleic acid amplification test detects fourteen high-risk HPV types (16,18,31,33,35,39,45,51,52,56,58,59,66,68) without differentiation.   Comprehensive metabolic panel  Status: None   Collection Time: 05/20/19  3:30 PM  Result Value Ref Range   Glucose 98 65 - 99 mg/dL   BUN 13 6 - 24 mg/dL   Creatinine, Ser 0.84 0.57 - 1.00 mg/dL   GFR calc non Af Amer 80 >59 mL/min/1.73   GFR calc Af Amer 92 >59 mL/min/1.73    Comment: **[facility] currently reports eGFR in compliance with the current**   recommendations of the [employer]. [facility] will   update reporting as new guidelines are published from the NKF-ASN   Task force.    BUN/Creatinine Ratio 15 9 - 23   Sodium 139 134 - 144 mmol/L   Potassium 4.5 3.5 - 5.2 mmol/L   Chloride 102 96 - 106 mmol/L   CO2 24 20 - 29 mmol/L   Calcium 9.6 8.7 - 10.2 mg/dL   Total Protein 6.9 6.0 - 8.5 g/dL   Albumin 4.3 3.8 - 4.9 g/dL   Globulin, Total 2.6 1.5 - 4.5 g/dL   Albumin/Globulin Ratio 1.7 1.2 - 2.2   Bilirubin Total 0.8 0.0 - 1.2 mg/dL   Alkaline Phosphatase 79 39 - 117 IU/L   AST 17 0 - 40 IU/L   ALT 14 0 - 32 IU/L  CBC      Status: Abnormal   Collection Time: 05/20/19  3:30 PM  Result Value Ref Range   WBC 4.5 3.4 - 10.8 x10E3/uL   RBC 6.30 (H) 3.77 - 5.28 x10E6/uL   Hemoglobin 12.6 11.1 - 15.9 g/dL   Hematocrit 39.4 34.0 - 46.6 %   MCV 63 (L) 79 - 97 fL   MCH 20.0 (L) 26.6 - 33.0 pg   MCHC 32.0 31 - 35 g/dL   RDW 18.5 (H) 11.7 - 15.4 %   Platelets 223 150 - 450 x10E3/uL  Lipid Panel With LDL/HDL Ratio     Status: Abnormal   Collection Time: 05/20/19  3:30 PM  Result Value Ref Range   Cholesterol, Total 189 100 - 199 mg/dL   Triglycerides 109 0 - 149 mg/dL   HDL 65 >39 mg/dL   VLDL Cholesterol Cal 19 5 - 40 mg/dL   LDL Chol Calc (NIH) 105 (H) 0 - 99 mg/dL   LDL/HDL Ratio 1.6 0.0 - 3.2 ratio    Comment:                                     LDL/HDL Ratio                                             Men  Women                               1/2 Avg.Risk  1.0    1.5                                   Avg.Risk  3.6    3.2                                2X Avg.Risk  6.2    5.0  3X Avg.Risk  8.0    6.1   T4, free     Status: None   Collection Time: 05/20/19  3:30 PM  Result Value Ref Range   Free T4 1.70 0.82 - 1.77 ng/dL  TSH     Status: None   Collection Time: 05/20/19  3:30 PM  Result Value Ref Range   TSH 0.971 0.450 - 4.500 uIU/mL  VITAMIN D 25 Hydroxy (Vit-D Deficiency, Fractures)     Status: Abnormal   Collection Time: 05/20/19  3:30 PM  Result Value Ref Range   Vit D, 25-Hydroxy 17.7 (L) 30.0 - 100.0 ng/mL    Comment: Vitamin D deficiency has been defined by the [place] and an Endocrine Society practice guideline as a level of serum 25-OH vitamin D less than 20 ng/mL (1,2). The Endocrine Society went on to further define vitamin D insufficiency as a level between 21 and 29 ng/mL (2). 1. IOM (Institute of Medicine). 2010. Dietary reference    intakes for calcium and D. [place]: The    [employer]. 2. Holick MF, Binkley Ledbetter,  Bischoff-Ferrari HA, et al.    Evaluation, treatment, and prevention of vitamin D    deficiency: an Endocrine Society clinical practice    guideline. JCEM. 2011 Jul; 96(7):1911-30.     Radiology: CT CHEST WO CONTRAST  Result Date: 06/08/2019 CLINICAL DATA:  Cough with persistent shortness of breath. EXAM: CT CHEST WITHOUT CONTRAST TECHNIQUE: Multidetector CT imaging of the chest was performed following the standard protocol without IV contrast. COMPARISON:  None. FINDINGS: Cardiovascular: The heart size is normal. No substantial pericardial effusion. No thoracic aortic aneurysm. Mediastinum/Nodes: No mediastinal lymphadenopathy. No evidence for gross hilar lymphadenopathy although assessment is limited by the lack of intravenous contrast on today's study. The esophagus has normal imaging features. There is no axillary lymphadenopathy. Lungs/Pleura: 4 mm subpleural nodule identified right middle lobe on image 76/3. Mild bronchial wall thickening noted bilaterally with areas of small airway impaction in the lower lobes. No focal airspace consolidation. No pleural effusion. Upper Abdomen: 13 mm low-density lesion lateral segment left liver cannot be definitively characterized but is likely benign. Similar 9 mm low-density lesion in the medial segment left liver approaches water density and is likely a cyst. Musculoskeletal: No worrisome lytic or sclerotic osseous abnormality. IMPRESSION: Diffuse bronchial wall thickening with scattered areas of small airway impaction in the lower lobes bilaterally. Imaging features may be related to an infectious/inflammatory etiology. 4 mm subpleural right middle lobe pulmonary nodule. No follow-up needed if patient is low-risk. Non-contrast chest CT can be considered in 12 months if patient is high-risk. This recommendation follows the consensus statement: Guidelines for Management of Incidental Pulmonary Nodules Detected on CT Images: From the Fleischner Society 2017;  Radiology 2017; 284:228-243. Electronically Signed   By: Misty Stanley M.D.   On: 06/08/2019 20:30    No results found.  CT CHEST WO CONTRAST  Result Date: 06/08/2019 CLINICAL DATA:  Cough with persistent shortness of breath. EXAM: CT CHEST WITHOUT CONTRAST TECHNIQUE: Multidetector CT imaging of the chest was performed following the standard protocol without IV contrast. COMPARISON:  None. FINDINGS: Cardiovascular: The heart size is normal. No substantial pericardial effusion. No thoracic aortic aneurysm. Mediastinum/Nodes: No mediastinal lymphadenopathy. No evidence for gross hilar lymphadenopathy although assessment is limited by the lack of intravenous contrast on today's study. The esophagus has normal imaging features. There is no axillary lymphadenopathy. Lungs/Pleura: 4 mm subpleural nodule identified right middle lobe on image  76/3. Mild bronchial wall thickening noted bilaterally with areas of small airway impaction in the lower lobes. No focal airspace consolidation. No pleural effusion. Upper Abdomen: 13 mm low-density lesion lateral segment left liver cannot be definitively characterized but is likely benign. Similar 9 mm low-density lesion in the medial segment left liver approaches water density and is likely a cyst. Musculoskeletal: No worrisome lytic or sclerotic osseous abnormality. IMPRESSION: Diffuse bronchial wall thickening with scattered areas of small airway impaction in the lower lobes bilaterally. Imaging features may be related to an infectious/inflammatory etiology. 4 mm subpleural right middle lobe pulmonary nodule. No follow-up needed if patient is low-risk. Non-contrast chest CT can be considered in 12 months if patient is high-risk. This recommendation follows the consensus statement: Guidelines for Management of Incidental Pulmonary Nodules Detected on CT Images: From the Fleischner Society 2017; Radiology 2017; 284:228-243. Electronically Signed   By: Misty Stanley M.D.   On:  06/08/2019 20:30      Assessment and Plan: Patient Active Problem List   Diagnosis Date Noted  . Encounter for general adult medical examination with abnormal findings 05/23/2019  . Shortness of breath 05/23/2019  . Routine cervical smear 05/23/2019  . Other fatigue 05/07/2018  . Wheezing 05/07/2018  . Respiratory infection 04/27/2018  . Cough 04/27/2018  . Screening for breast cancer 10/19/2017  . Ganglion cyst of finger 10/19/2017  . Acquired hypothyroidism 10/19/2017  . Vitamin D deficiency 10/19/2017  . Dysuria 10/19/2017  . Internal hemorrhoids   . Diverticulosis of large intestine without diverticulitis   . 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 Anemia 09/13/2015  . Intramural leiomyoma of uterus 11/09/2014  . Abnormal perimenopausal bleeding 11/09/2014  . Disease of thyroid gland 10/26/2014  . IDA (iron deficiency anemia) 03/29/2013    1. Asthma Intermittent continue with inhalers as prescribed.  Appears to be under slightly better control 2. Mucoid impaction concern for a mucoid impaction findings on the imaging studies.  I did recommend getting work-up for ABPA so ordered the antibodies and complete work-up 3. Allergic Rhinitis nasal steroids will continue to monitor 4. RML pulmonary nodule will need a CT scan in a year for follow up. She is low risk  General Counseling: I have discussed the findings of the evaluation and examination with Amos.  I have also discussed any further diagnostic evaluation thatmay be needed or ordered today. Tannis verbalizes understanding of the findings of todays visit. We also reviewed her medications today and discussed drug interactions and side effects including but not limited excessive drowsiness and altered mental states. We also discussed that there is always a risk not just to her but also people around her. she has been encouraged to call the office with any questions or concerns that should arise related to todays  visit.  Orders Placed This Encounter  Procedures  . Allergy Test    Order Specific Question:   Allergy test to perform    Answer:   [place]  . IgE  . Aspergillus IgE Panel  . Aspergillus antibody by immunodiff  . Allergen, A fumigatus IgG  . QuantiFERON-TB Gold Plus     Time spent: 30  I have personally obtained a history, examined the patient, evaluated laboratory and imaging results, formulated the assessment and plan and placed orders.    Allyne Gee, MD [facility] Pulmonary and Critical Care Sleep medicine

## 2019-07-06 NOTE — Patient Instructions (Signed)
Asthma, Adult  Asthma is a long-term (chronic) condition in which the airways get tight and narrow. The airways are the breathing passages that lead from the nose and mouth down into the lungs. A person with asthma will have times when symptoms get worse. These are called asthma attacks. They can cause coughing, whistling sounds when you breathe (wheezing), shortness of breath, and chest pain. They can make it hard to breathe. There is no cure for asthma, but medicines and lifestyle changes can help control it. There are many things that can bring on an asthma attack or make asthma symptoms worse (triggers). Common triggers include:  Mold.  Dust.  Cigarette smoke.  Cockroaches.  Things that can cause allergy symptoms (allergens). These include animal skin flakes (dander) and pollen from trees or grass.  Things that pollute the air. These may include household cleaners, wood smoke, smog, or chemical odors.  Cold air, weather changes, and wind.  Crying or laughing hard.  Stress.  Certain medicines or drugs.  Certain foods such as dried fruit, potato chips, and grape juice.  Infections, such as a cold or the flu.  Certain medical conditions or diseases.  Exercise or tiring activities. Asthma may be treated with medicines and by staying away from the things that cause asthma attacks. Types of medicines may include:  Controller medicines. These help prevent asthma symptoms. They are usually taken every day.  Fast-acting reliever or rescue medicines. These quickly relieve asthma symptoms. They are used as needed and provide short-term relief.  Allergy medicines if your attacks are brought on by allergens.  Medicines to help control the body's defense (immune) system. Follow these instructions at home: Avoiding triggers in your home  Change your heating and air conditioning filter often.  Limit your use of fireplaces and wood stoves.  Get rid of pests (such as roaches and  mice) and their droppings.  Throw away plants if you see mold on them.  Clean your floors. Dust regularly. Use cleaning products that do not smell.  Have someone vacuum when you are not home. Use a vacuum cleaner with a HEPA filter if possible.  Replace carpet with wood, tile, or vinyl flooring. Carpet can trap animal skin flakes and dust.  Use allergy-proof pillows, mattress covers, and box spring covers.  Wash bed sheets and blankets every week in hot water. Dry them in a dryer.  Keep your bedroom free of any triggers.  Avoid pets and keep windows closed when things that cause allergy symptoms are in the air.  Use blankets that are made of polyester or cotton.  Clean bathrooms and kitchens with bleach. If possible, have someone repaint the walls in these rooms with mold-resistant paint. Keep out of the rooms that are being cleaned and painted.  Wash your hands often with soap and water. If soap and water are not available, use hand sanitizer.  Do not allow anyone to smoke in your home. General instructions  Take over-the-counter and prescription medicines only as told by your doctor. ? Talk with your doctor if you have questions about how or when to take your medicines. ? Make note if you need to use your medicines more often than usual.  Do not use any products that contain nicotine or tobacco, such as cigarettes and e-cigarettes. If you need help quitting, ask your doctor.  Stay away from secondhand smoke.  Avoid doing things outdoors when allergen counts are high and when air quality is low.  Wear a ski mask   when doing outdoor activities in the winter. The mask should cover your nose and mouth. Exercise indoors on cold days if you can.  Warm up before you exercise. Take time to cool down after exercise.  Use a peak flow meter as told by your doctor. A peak flow meter is a tool that measures how well the lungs are working.  Keep track of the peak flow meter's readings.  Write them down.  Follow your asthma action plan. This is a written plan for taking care of your asthma and treating your attacks.  Make sure you get all the shots (vaccines) that your doctor recommends. Ask your doctor about a flu shot and a pneumonia shot.  Keep all follow-up visits as told by your doctor. This is important. Contact a doctor if:  You have wheezing, shortness of breath, or a cough even while taking medicine to prevent attacks.  The mucus you cough up (sputum) is thicker than usual.  The mucus you cough up changes from clear or white to yellow, green, gray, or bloody.  You have problems from the medicine you are taking, such as: ? A rash. ? Itching. ? Swelling. ? Trouble breathing.  You need reliever medicines more than 2-3 times a week.  Your peak flow reading is still at 50-79% of your personal best after following the action plan for 1 hour.  You have a fever. Get help right away if:  You seem to be worse and are not responding to medicine during an asthma attack.  You are short of breath even at rest.  You get short of breath when doing very little activity.  You have trouble eating, drinking, or talking.  You have chest pain or tightness.  You have a fast heartbeat.  Your lips or fingernails start to turn blue.  You are light-headed or dizzy, or you faint.  Your peak flow is less than 50% of your personal best.  You feel too tired to breathe normally. Summary  Asthma is a long-term (chronic) condition in which the airways get tight and narrow. An asthma attack can make it hard to breathe.  Asthma cannot be cured, but medicines and lifestyle changes can help control it.  Make sure you understand how to avoid triggers and how and when to use your medicines. This information is not intended to replace advice given to you by your health care provider. Make sure you discuss any questions you have with your health care provider. Document Revised:  03/12/2018 Document Reviewed: 02/12/2016 Elsevier Patient Education  2020 Elsevier Inc.  

## 2019-07-14 ENCOUNTER — Telehealth: Payer: Self-pay

## 2019-07-14 NOTE — Telephone Encounter (Signed)
Mailed patient allergy test information. Beverly Campbell

## 2019-07-20 LAB — ASPERGILLUS IGE PANEL
A. Amstel/Glaucu Class Interp: 0
A. Flavus Class Interp: 0
A. Fumigatus Class Interp: 0
A. Nidulans Class Interp: 0
A. Niger Class Interp: 0
A. Versicolor Class Interp: 0
Aspergillus amstel/glaucu IgE*: <0.35 kU/L (ref <0.35)
Aspergillus flavus IgE: <0.35 kU/L (ref <0.35)
Aspergillus fumigatus IgE: <0.1 kU/L (ref <0.35)
Aspergillus nidulans IgE: <0.35 kU/L (ref <0.35)
Aspergillus niger IgE: <0.1 kU/L (ref <0.35)
Aspergillus versicolor IgE: <0.1 kU/L (ref <0.35)

## 2019-07-20 LAB — QUANTIFERON-TB GOLD PLUS
QuantiFERON Mitogen Value: >10 IU/mL
QuantiFERON Nil Value: 0.34 IU/mL
QuantiFERON TB1 Ag Value: 0.37 IU/mL
QuantiFERON TB2 Ag Value: 0.36 IU/mL
QuantiFERON-TB Gold Plus: NEGATIVE

## 2019-07-20 LAB — ASPERGILLUS ANTIBODY BY IMMUNODIFF
Aspergillus flavus: NEGATIVE
Aspergillus fumigatus, IgG: NEGATIVE
Aspergillus niger: NEGATIVE

## 2019-07-20 LAB — IGE: IgE (Immunoglobulin E), Serum: 26 IU/mL (ref 6–495)

## 2019-07-20 LAB — ALLERGEN A FUMIGATUS IGG: Aspergillus fumigatus IgG: 37.9 ug/mL — ABNORMAL HIGH (ref 0.0–1.9)

## 2019-07-22 ENCOUNTER — Other Ambulatory Visit: Payer: Self-pay

## 2019-07-22 MED ORDER — LEVOTHYROXINE SODIUM 100 MCG PO TABS: 100.0000 ug | ORAL_TABLET | Freq: Every day | ORAL | 1 refills | Status: DC

## 2019-08-31 ENCOUNTER — Ambulatory Visit: Payer: Managed Care, Other (non HMO) | Admitting: Internal Medicine

## 2019-08-31 ENCOUNTER — Encounter: Payer: Self-pay | Admitting: Internal Medicine

## 2019-08-31 ENCOUNTER — Other Ambulatory Visit: Payer: Self-pay

## 2019-08-31 VITALS — BP 122/100 | HR 77 | Temp 97.7°F | Resp 16 | Ht 61.0 in | Wt 177.8 lb

## 2019-08-31 DIAGNOSIS — J301 Allergic rhinitis due to pollen: Secondary | ICD-10-CM

## 2019-08-31 NOTE — Progress Notes (Signed)
Patient was here for an allergy test

## 2019-08-31 NOTE — Procedures (Signed)
    [facility] Allergy MQT Recording Form  [facility] [address] [place], [place] [address] Phone [phone] Fax [phone]   Patient Name: Beverly Campbell Age: 54 y.o. Sex: female Date of Service: 08/31/2019   Performing Provider: Allyne Gee MD [facility]         Battery A Back   Site Antigen [facility] Flare  A1 Positive Control 10 17  A2 Negative Control 0 0  A3 American Elm 0 0  A4 Maple Box Elder 7 9  A5 Grass Mix 25 35  A6 Dock Sorrel Mix 9 10  A7 Russian Thistle 0 0  A8 Ragweed Mix 0 0  A9 English Pantain 0 0  A10 Oak Mix  7 9   Battery B Wheal Flare  B1 Lambs Quarters 0 0  B2 Cottonwood 0 0  B3 Pigweed Mix 0 0  B4 Acacia 0 0  B5 Pine Mix 0 0  B6 Privet 0 0  B7 White/Red Mulberry 0 0  B8 Western Water Hemp 0 0  B9 Guatemala Grass 10 12  B10 Melalucea 0 0   Battery C Wheal Flare  C1 [place] Birch 0 0  C2 Eastern Sycamore 0 0  C3 Bahai Grass 0 0  C4 American Beech 0 0  C5 Ash Mix 0 0  C6 Black Willow 0 0  C7 Hickory 0 0  C8 Black Walnut 0 0  C9 Red Cedar 0 0  C10 Sweet Gum  0 0   Battery D Wheal Flare  D1 Cultivated Oat 20 30  D2 Dog Fennel 0 0  D3 Common Mugwort 0 0  D4 Marsh Elder 0 0  D5 Johnson 0 0  D6 Hackberry Tree 0 0  D7 Bayberry Tree 0 0  D8 Cypress, Bald Tree 0 0  D9 Aspergillus Fumigatus 0 0  D10 Alternia  0 0   Battery E Wheal Flare  E1 Dreschlere 7 9  E2 Fusarium Mix 0 0  E3 Cladosporum Sph 0 0  E4 Bipolaris 0 0  E5 Penicillin chrys 0 0  E6 Cladosporum Herb 0 0  E7 Candida 0 0  E8 Aureobasidium 0 0  E9 Rhizopus 0 0  E10 Botrytis  0 0   Battery F Wheal Flare  F1 Aspergillus [place] 9 11  F2 Dust Mite Mix 0 0  F3 Cockroach Mix 0 0  F4 Cat Hair 0 0  F5 Dog Mixed breeds 0 0  F6 Feather Mix 0 0

## 2019-08-31 NOTE — Patient Instructions (Signed)
Allergy Shots, Adult Allergy shots, also known as allergen immunotherapy, is a treatment that helps reduce allergy symptoms or conditions such as:  Sneezing.  Itchy, watery eyes.  A runny, stuffy nose.  Asthma. The treatment may benefit people who are allergic to any of these substances:  Pollen from grasses, trees, plants, and weeds.  Insect venom.  Animal dander.  House dust mites.  Molds. The treatment is not done for food allergies. During your allergy immunotherapy treatments, the substance that you are allergic to (allergen) will be injected under your skin.  At first, only a little bit of the substance will be given.  Over time, the amount will slowly be increased.  The treatments allow your body to build up a tolerance (immunity) to the allergen and create proteins called antibodies that block the effect that the allergen has on your body. Most people start by getting shots 1-3 times a week for 3-6 months, then they continue to get maintenance shots about one time per month for life. Some people can stop getting shots after 3-5 years. It may be necessary to stop this treatment if:  The shots do not work for you.  You start taking certain medicines that interact with the shots.  You miss many appointments for your shots. Allergy tablets given under the tongue is another treatment option that may work for certain types of allergies. Tell a health care provider about:  All medicines you are taking, including vitamins, herbs, eye drops, creams, and over-the-counter medicines. If you are taking certain types of heart medicine (beta blockers), you may not be able to receive allergy shots.  Any blood disorders you have.  Any medical conditions you have, especially asthma.  Whether you are pregnant or may be pregnant. What are the risks? This treatment can cause side effects. The most common side effects affect the injection area and include mild redness and swelling.  They often go away on their own. Serious reactions, while rare, can happen. A serious reaction that spreads throughout the body (systemic reaction) may require immediate treatment by a health care provider. Reactions usually happen within the first 30 minutes after the shots (injections), but they can occur later. What happens before the procedure?  Let your health care provider know if you are not feeling well the day of the injection. What happens during the procedure?  The allergen will be injected into your skin. The procedure may vary among health care providers and hospitals. What happens after the procedure?   You will need to stay at the clinic for up to 30 minutes so a health care provider can be sure that you do not have serious side effects.  The shots will begin to work shortly after you begin treatment, but your allergy symptoms may not improve for 3-12 months.  Get help right away if you have any symptoms of a systemic reaction. These include: ? Itchy, red, swollen areas of skin (hives) or rash. ? Itchy eyes, nose, or throat. ? Runny nose or nasal congestion. ? Coughing or trouble breathing. ? Wheezing. ? Chest tightness. ? Swelling of the throat. ? Nausea. ? Dizziness.  Keep all follow-up visits as told by your health care provider. This is important. Summary  Allergen immunotherapy is a treatment that helps reduce allergy symptoms.  Before receiving injections, tell a health care provider about all medicines you are taking.  The most common side effects of allergy shots are mild redness and swelling at the injection site.    There is a risk of a serious allergic reaction. Seek medical care right away if you develop wheezing, chest tightness, or any trouble with breathing. This information is not intended to replace advice given to you by your health care provider. Make sure you discuss any questions you have with your health care provider. Document Revised:  12/20/2016 Document Reviewed: 11/27/2015 Elsevier Patient Education  2020 [employer]  

## 2019-09-24 ENCOUNTER — Telehealth: Payer: Self-pay

## 2019-09-24 NOTE — Telephone Encounter (Signed)
LMOM on OV 9/7

## 2019-09-28 ENCOUNTER — Ambulatory Visit: Payer: Managed Care, Other (non HMO) | Admitting: Internal Medicine

## 2019-11-11 ENCOUNTER — Telehealth: Payer: Self-pay

## 2019-11-11 NOTE — Telephone Encounter (Signed)
Confirmed and screened for 11-12-19 ov. °

## 2019-11-12 ENCOUNTER — Other Ambulatory Visit: Payer: Self-pay

## 2019-11-12 ENCOUNTER — Encounter: Payer: Self-pay | Admitting: Nurse Practitioner

## 2019-11-12 ENCOUNTER — Ambulatory Visit: Payer: Managed Care, Other (non HMO) | Admitting: Nurse Practitioner

## 2019-11-12 VITALS — BP 104/84 | HR 80 | Temp 97.3°F | Resp 16 | Ht 61.0 in | Wt 178.0 lb

## 2019-11-12 DIAGNOSIS — L209 Atopic dermatitis, unspecified: Secondary | ICD-10-CM | POA: Diagnosis not present

## 2019-11-12 DIAGNOSIS — E039 Hypothyroidism, unspecified: Secondary | ICD-10-CM

## 2019-11-12 DIAGNOSIS — J4520 Mild intermittent asthma, uncomplicated: Secondary | ICD-10-CM | POA: Diagnosis not present

## 2019-11-12 DIAGNOSIS — Z6833 Body mass index (BMI) 33.0-33.9, adult: Secondary | ICD-10-CM

## 2019-11-12 MED ORDER — ALBUTEROL SULFATE HFA 108 (90 BASE) MCG/ACT IN AERS: 2.0000 | INHALATION_SPRAY | Freq: Four times a day (QID) | RESPIRATORY_TRACT | 3 refills | Status: DC | PRN

## 2019-11-12 MED ORDER — TRIAMCINOLONE ACETONIDE 0.025 % EX CREA: 1.0000 "application " | TOPICAL_CREAM | Freq: Two times a day (BID) | CUTANEOUS | 2 refills | Status: AC

## 2019-11-12 NOTE — Patient Instructions (Signed)

## 2019-11-12 NOTE — Progress Notes (Signed)
[facility] [place], [place] [address]  Internal MEDICINE  Office Visit Note  Patient Name: Beverly Campbell  [id number]  [id number]  Date of Service: 12/04/2019  Chief Complaint  Patient presents with  . Follow-up  . Hypothyroidism  . Anemia  . policy update form    received    The patient is here for routine follow up. She states that her breathing has been doing well. Has been using symbicort twice daily and has to take her rescue inhaler as needed for acute wheezing and cough. She needs to have a refill for her rescue inhaler. She has had routine, fasting labs done in 04/2019. Cholesterol was mildly elevated and vitamin d was low. She has no new concerns or complaints today. She has bought exemption form from her employer which needs to state that she will enter into a medically managed program for weight management. Her BMI is 33.63 and her employer acts that she has bmi of 29 or lower to avoid an increase in her health insurance premiums. She should limit her calorie intake to 1200 calories per day and incorporate exercise into her daily routine.       Current Medication: Outpatient Encounter Medications as of 11/12/2019  Medication Sig  . albuterol (PROVENTIL) (5 MG/ML) 0.5% nebulizer solution Take 0.5 mLs (2.5 mg total) by nebulization every 6 (six) hours as needed for wheezing or shortness of breath.  Marland Kitchen albuterol (VENTOLIN HFA) 108 (90 Base) MCG/ACT inhaler Inhale 2 puffs into the lungs every 6 (six) hours as needed for wheezing or shortness of breath.  . budesonide-formoterol (SYMBICORT) 80-4.5 MCG/ACT inhaler Inhale 2 puffs into the lungs 2 (two) times daily.  Marland Kitchen levothyroxine (SYNTHROID) 100 MCG tablet Take 1 tablet (100 mcg total) by mouth daily.  . Multiple Vitamins-Iron (MULTIVITAMIN/IRON PO) Take by mouth.  . [DISCONTINUED] albuterol (VENTOLIN HFA) 108 (90 Base) MCG/ACT inhaler Inhale 2 puffs into the lungs every 6 (six) hours as  needed for wheezing or shortness of breath.  . triamcinolone (KENALOG) 0.025 % cream Apply 1 application topically 2 (two) times daily.  . [DISCONTINUED] montelukast (SINGULAIR) 10 MG tablet TAKE 1 TABLET BY MOUTH EVERYDAY AT BEDTIME (Patient not taking: Reported on 11/12/2019)   No facility-administered encounter medications on file as of 11/12/2019.    Surgical History: Past Surgical History:  Procedure Laterality Date  . COLONOSCOPY WITH PROPOFOL N/A 01/01/2016   Procedure: COLONOSCOPY WITH PROPOFOL;  Surgeon: Jonathon Bellows, MD;  Location: [facility] ENDOSCOPY;  Service: Endoscopy;  Laterality: N/A;  . ESOPHAGOGASTRODUODENOSCOPY (EGD) WITH PROPOFOL N/A 01/01/2016   Procedure: ESOPHAGOGASTRODUODENOSCOPY (EGD) WITH PROPOFOL;  Surgeon: Jonathon Bellows, MD;  Location: [facility] ENDOSCOPY;  Service: Endoscopy;  Laterality: N/A;  . THYROID SURGERY  1997   had thyroid removed  . TUBAL LIGATION  1995    Medical History: Past Medical History:  Diagnosis Date  . Anemia   . Hypothyroidism   . Thyroid disease     Family History: Family History  Problem Relation Age of Onset  . Colon cancer Father   . Hypertension Mother   . Diabetes Mother     Social History   Socioeconomic History  . Marital status: Divorced    Spouse name: Not on file  . Number of children: Not on file  . Years of education: Not on file  . Highest education level: Not on file  Occupational History  . Not on file  Tobacco Use  . Smoking status: Never Smoker  . Smokeless tobacco:  Never Used  Substance and Sexual Activity  . Alcohol use: Yes    Comment: occasionally  . Drug use: No  . Sexual activity: Yes    Birth control/protection: None  Other Topics Concern  . Not on file  Social History Narrative  . Not on file   Social Determinants of Health   Financial Resource Strain:   . Difficulty of Paying Living Expenses: Not on file  Food Insecurity:   . Worried About Charity fundraiser in the Last Year: Not on file   . Ran Out of Food in the Last Year: Not on file  Transportation Needs:   . Lack of Transportation (Medical): Not on file  . Lack of Transportation (Non-Medical): Not on file  Physical Activity:   . Days of Exercise per Week: Not on file  . Minutes of Exercise per Session: Not on file  Stress:   . Feeling of Stress : Not on file  Social Connections:   . Frequency of Communication with Friends and Family: Not on file  . Frequency of Social Gatherings with Friends and Family: Not on file  . Attends Religious Services: Not on file  . Active Member of Clubs or Organizations: Not on file  . Attends Archivist Meetings: Not on file  . Marital Status: Not on file  Intimate Partner Violence:   . Fear of Current or Ex-Partner: Not on file  . Emotionally Abused: Not on file  . Physically Abused: Not on file  . Sexually Abused: Not on file      Review of Systems  Constitutional: Negative for activity change, chills, fatigue and unexpected weight change.       Two pound weight loss since last visit   HENT: Negative for congestion, rhinorrhea, sinus pressure, sinus pain, sneezing and sore throat.   Respiratory: Positive for cough, shortness of breath and wheezing. Negative for chest tightness.        Cough, wheezing, and shortness of breath are well managed. She does need to have refill of her rescue inhaler today.   Cardiovascular: Negative for chest pain and palpitations.  Gastrointestinal: Negative for abdominal pain, constipation, diarrhea, nausea and vomiting.  Endocrine: Negative for cold intolerance, heat intolerance, polydipsia and polyuria.  Musculoskeletal: Negative for arthralgias, back pain, joint swelling and neck pain.  Skin: Negative for rash.  Allergic/Immunologic: Positive for environmental allergies.  Neurological: Negative for dizziness, tremors, numbness and headaches.  Hematological: Negative for adenopathy. Does not bruise/bleed easily.   Psychiatric/Behavioral: Negative for behavioral problems and sleep disturbance. The patient is not nervous/anxious.     Today's Vitals   11/12/19 1550  BP: 104/84  Pulse: 80  Resp: 16  Temp: (!) 97.3 F (36.3 C)  SpO2: 97%  Weight: 178 lb (80.7 kg)  Height: 5\' 1"  (1.549 m)   Body mass index is 33.63 kg/m.  Physical Exam Vitals and nursing note reviewed.  Constitutional:      General: She is not in acute distress.    Appearance: Normal appearance. She is well-developed. She is obese. She is not diaphoretic.  HENT:     Head: Normocephalic and atraumatic.     Nose: Nose normal.     Mouth/Throat:     Pharynx: No oropharyngeal exudate.  Eyes:     Pupils: Pupils are equal, round, and reactive to light.  Neck:     Thyroid: No thyromegaly.     Vascular: No carotid bruit or JVD.     Trachea: No tracheal  deviation.  Cardiovascular:     Rate and Rhythm: Normal rate and regular rhythm.     Heart sounds: Normal heart sounds. No murmur heard.  No friction rub. No gallop.   Pulmonary:     Effort: Pulmonary effort is normal. No respiratory distress.     Breath sounds: Normal breath sounds. No wheezing or rales.  Chest:     Chest wall: No tenderness.  Abdominal:     Palpations: Abdomen is soft.  Musculoskeletal:        General: Normal range of motion.     Cervical back: Normal range of motion and neck supple.  Lymphadenopathy:     Cervical: No cervical adenopathy.  Skin:    General: Skin is warm and dry.  Neurological:     General: No focal deficit present.     Mental Status: She is alert and oriented to person, place, and time.     Cranial Nerves: No cranial nerve deficit.  Psychiatric:        Mood and Affect: Mood normal.        Behavior: Behavior normal.        Thought Content: Thought content normal.        Judgment: Judgment normal.    Assessment/Plan: 1. Mild intermittent asthma without complication Well managed. Continue with symbicort twice daily. Use rescue  inhaler as needed and as prescribed for shortness of breath, cough, or wheezing.  - albuterol (VENTOLIN HFA) 108 (90 Base) MCG/ACT inhaler; Inhale 2 puffs into the lungs every 6 (six) hours as needed for wheezing or shortness of breath.  Dispense: 18 g; Refill: 3  2. Acquired hypothyroidism Most recent thyroid panel stable. Continue levothyroxine as prescribed   3. Atopic dermatitis, unspecified type May use triamcinolone cream on effected area twice daily as needed.  - triamcinolone (KENALOG) 0.025 % cream; Apply 1 application topically 2 (two) times daily.  Dispense: 80 g; Refill: 2  4. Body mass index (BMI) of 33.0-33.9 in adult completed and returned exemption form from patient's health insurance/employer. She will limit her calorie intake to 1200 calories daily. She will also gradually incorporate exercise into her daily routine.   General Counseling: Bethannie verbalizes understanding of the findings of todays visit and agrees with plan of treatment. I have discussed any further diagnostic evaluation that may be needed or ordered today. We also reviewed her medications today. she has been encouraged to call the office with any questions or concerns that should arise related to todays visit.   Obesity Counseling: Risk Assessment: An assessment of behavioral risk factors was made today and includes lack of exercise sedentary lifestyle, lack of portion control and poor dietary habits.  Risk Modification Advice: She was counseled on portion control guidelines. Restricting daily caloric intake to 1200 calories per day. The detrimental long term effects of obesity on her health and ongoing poor compliance was also discussed with the patient.  This patient was seen by [place] with Dr Lavera Guise as a part of collaborative care agreement  Meds ordered this encounter  Medications  . albuterol (VENTOLIN HFA) 108 (90 Base) MCG/ACT inhaler    Sig: Inhale 2 puffs into  the lungs every 6 (six) hours as needed for wheezing or shortness of breath.    Dispense:  18 g    Refill:  3    Please fill as ventolin as this works better for her than proair.    Order Specific Question:   Supervising Provider  Answer:   Lavera Guise [place]  . triamcinolone (KENALOG) 0.025 % cream    Sig: Apply 1 application topically 2 (two) times daily.    Dispense:  80 g    Refill:  2    Order Specific Question:   Supervising Provider    Answer:   Lavera Guise [[id number]]    Total time spent: 30 Minutes   Time spent includes review of chart, medications, test results, and follow up plan with the patient.      Dr Lavera Guise Internal medicine

## 2019-12-04 DIAGNOSIS — J4520 Mild intermittent asthma, uncomplicated: Secondary | ICD-10-CM | POA: Insufficient documentation

## 2019-12-04 DIAGNOSIS — L209 Atopic dermatitis, unspecified: Secondary | ICD-10-CM | POA: Insufficient documentation

## 2019-12-04 DIAGNOSIS — Z6833 Body mass index (BMI) 33.0-33.9, adult: Secondary | ICD-10-CM | POA: Insufficient documentation

## 2020-01-06 ENCOUNTER — Other Ambulatory Visit: Payer: Self-pay

## 2020-01-06 MED ORDER — LEVOTHYROXINE SODIUM 100 MCG PO TABS: 100.0000 ug | ORAL_TABLET | Freq: Every day | ORAL | 1 refills | Status: DC

## 2020-02-23 NOTE — Progress Notes (Incomplete)
[facility]  [address] [place], [place] [address] Phone: [phone]  Fax: [phone]   Clinic Day:  02/23/2020  Referring physician: Ronnell Freshwater, NP  Chief Complaint: Beverly Campbell is a 55 y.o. female with iron deficiency anemia who is seen for 1 year assessment.   HPI: The patient was last seen in the hematology clinic on 02/25/2019 via telemedicine. At that time, she felt good. She had lingering URI symptoms. She denied any bleeding. [facility] labs showed a hematocrit of 37.8, hemoglobin 12.0, platelets 214,000, WBC 5,800.  Ferritin was 86.  PFTs on 03/17/2019 were suggestive of mild restrictive lung disease.  Screening bilateral mammogram on 04/16/2019 revealed no evidence of malignancy.  CXR on 05/13/2019 revealed mild basilar airways thickening, similar to priors which could reflect mild chronic bronchitis or reactive airways disease. There were no other acute cardiopulmonary findings.  Chest CT without contrast on 06/08/2019 revealed diffuse bronchial wall thickening with scattered areas of small airway impaction in the lower lobes bilaterally. Imaging features may be related to an infectious/inflammatory etiology. There was a 4 mm subpleural right middle lobe pulmonary nodule. No follow-up needed if patient is low-risk. Non-contrast chest CT can be considered in 12 months if patient is high-risk.   The patient saw Leretha Pol, NP on 11/12/2019 for a routine follow-up. Her mild intermittent asthma was well managed on symbicort twice daily and a rescue inhaler prn.   During the interim, ***   Past Medical History:  Diagnosis Date  . Anemia   . Hypothyroidism   . Thyroid disease     Past Surgical History:  Procedure Laterality Date  . COLONOSCOPY WITH PROPOFOL N/A 01/01/2016   Procedure: COLONOSCOPY WITH PROPOFOL;  Surgeon: Jonathon Bellows, MD;  Location: [facility] ENDOSCOPY;  Service: Endoscopy;  Laterality: N/A;  .  ESOPHAGOGASTRODUODENOSCOPY (EGD) WITH PROPOFOL N/A 01/01/2016   Procedure: ESOPHAGOGASTRODUODENOSCOPY (EGD) WITH PROPOFOL;  Surgeon: Jonathon Bellows, MD;  Location: [facility] ENDOSCOPY;  Service: Endoscopy;  Laterality: N/A;  . THYROID SURGERY  1997   had thyroid removed  . TUBAL LIGATION  1995    Family History  Problem Relation Age of Onset  . Colon cancer Father   . Hypertension Mother   . Diabetes Mother     Social History:  reports that she has never smoked. She has never used smokeless tobacco. She reports current alcohol use. She reports that she does not use drugs. The patient is from [place].  She likes to be called "[name]"; pronounced like Joy with an "L". She came to the [place] in 1980.  She lives in [place].  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  She has 3 children (ages 13, 38, 75) who are alive and well.  No family history of anemia or thalassesmia. The patient is alone*** today.  Allergies: No Known Allergies  Current Medications: Current Outpatient Medications  Medication Sig Dispense Refill  . albuterol (PROVENTIL) (5 MG/ML) 0.5% nebulizer solution Take 0.5 mLs (2.5 mg total) by nebulization every 6 (six) hours as needed for wheezing or shortness of breath. 20 mL 12  . albuterol (VENTOLIN HFA) 108 (90 Base) MCG/ACT inhaler Inhale 2 puffs into the lungs every 6 (six) hours as needed for wheezing or shortness of breath. 18 g 3  . budesonide-formoterol (SYMBICORT) 80-4.5 MCG/ACT inhaler Inhale 2 puffs into the lungs 2 (two) times daily. 1 Inhaler 3  . levothyroxine (SYNTHROID) 100 MCG tablet Take 1 tablet (100 mcg total) by mouth daily.  90 tablet 1  . Multiple Vitamins-Iron (MULTIVITAMIN/IRON PO) Take by mouth.    . triamcinolone (KENALOG) 0.025 % cream Apply 1 application topically 2 (two) times daily. 80 g 2   No current facility-administered medications for this visit.    Review of Systems  Constitutional: Negative.  Negative for chills, diaphoresis, fever,  malaise/fatigue and weight loss (gaining).       Feels "alright".  HENT: Negative.  Negative for congestion, ear pain, nosebleeds, sinus pain and sore throat.        On Singulair to clear up mucus.  Eyes: Negative.  Negative for blurred vision, double vision, photophobia and pain.  Respiratory: Negative.  Negative for cough, hemoptysis, sputum production and shortness of breath.   Cardiovascular: Negative.  Negative for palpitations, orthopnea, leg swelling and PND.  Gastrointestinal: Negative.  Negative for abdominal pain, blood in stool, constipation, diarrhea, melena and nausea.  Genitourinary: Negative.  Negative for dysuria, frequency, hematuria and urgency.       No menses in the past 3-4 months.  Musculoskeletal: Negative.  Negative for back pain, falls, joint pain, myalgias and neck pain.  Skin: Negative.  Negative for itching and rash.  Neurological: Negative.  Negative for dizziness, tremors, sensory change, speech change, focal weakness, weakness and headaches.  Endo/Heme/Allergies: Does not bruise/bleed easily.       HYPOthyroidism on levothyroxine. Hot flashes.  Psychiatric/Behavioral: Negative.  Negative for depression and memory loss. The patient is not nervous/anxious and does not have insomnia.   All other systems reviewed and are negative.  Performance status (ECOG): 0***  Physical Exam Nursing note reviewed.  Constitutional:      General: She is not in acute distress.    Appearance: She is well-developed. She is not diaphoretic.  HENT:     Head: Normocephalic and atraumatic.  Eyes:     General: No scleral icterus.    Conjunctiva/sclera: Conjunctivae normal.     Comments: Glasses.  Brown eyes.  Neurological:     Mental Status: She is alert and oriented to person, place, and time.  Psychiatric:        Behavior: Behavior normal.        Thought Content: Thought content normal.     No visits with results within 3 Day(s) from this visit.  Latest known visit with  results is:  Office Visit on 07/06/2019  Component Date Value Ref Range Status  . IgE (Immunoglobulin E), Serum 07/14/2019 26  6 - 495 IU/mL Final  . Aspergillus fumigatus IgE 07/14/2019 <0.10  <0.35 kU/L Final  . A. Fumigatus Class Interp 07/14/2019 0   Final  . Aspergillus [place] IgE 07/14/2019 <0.10  <0.35 kU/L Final  . A. [place] Class Interp 07/14/2019 0   Final  . Aspergillus versicolor IgE 07/14/2019 <0.10  <0.35 kU/L Final  . A. Versicolor Class Interp 07/14/2019 0   Final   Comment: The test method is the [employer] ImmunoCAP allergen-specific IgE system. CLASS INTERPRETATION   <0.10 kU/L= 0, Negative; 0.10 - 0.34 kU/L= 0/1, Equivocal/Borderline; 0.35 - 0.69  kU/L=1, Low Positive; 0.70 - 3.49 kU/L=2, Moderate Positive;  3.50  - 17.49 kU/L=3, High Positive; 17.50 - 49.99 kU/L= 4, Very High Positive; 50.00 - 99.99 kU/L= 5, Very High Positive;   >99.99 kU/L=6, Very High Positive   . Aspergillus amstel/glaucu IgE* 07/14/2019 <0.35  <0.35 kU/L Final  . A. Amstel/Glaucu Class Interp 07/14/2019 0   Final  . Aspergillus flavus IgE 07/14/2019 <0.35  <0.35 kU/L Final  . A.  Flavus Class Interp 07/14/2019 0   Final  . Aspergillus nidulans IgE 07/14/2019 <0.35  <0.35 kU/L Final  . A. Nidulans Class Interp 07/14/2019 0   Final   Comment: This conventional EIA uses allergen-coated discs from several suppliers and an enzyme-labeled anti-IgE. CLASS INTERPRETATION: <0.35 kU/L=0, Below Detection; 0.35-0.69 kU/L= 1, Low Positive; 0.70-3.49 kU/L= 2, Moderate Positive; 3.50-17.49 kU/L= 3, Positive; 17.50-49.99 kU/L= 4, Strong Positive; 50.00-99.99 kU/L= 5, Very Strong Positive; >99.99 kU/L= 6, Very Strong Positive *This test was developed and its performance characteristics determined by [employer] [facility]. It has not been cleared or approved by the U.S. Food and Drug Administration.   . Aspergillus fumigatus, IgG 07/14/2019 Negative  Neg:<1:1 Final  . Aspergillus flavus 07/14/2019 Negative  Neg:<1:1  Final  . Aspergillus [place] 07/14/2019 Negative  Neg:<1:1 Final  . Aspergillus fumigatus IgG 07/14/2019 37.9* 0.0 - 1.9 ug/mL Final  . QuantiFERON Incubation 07/14/2019 Incubation performed.   Final  . QuantiFERON Criteria 07/14/2019 Comment   Final   Comment: The QuantiFERON-TB Gold Plus result is determined by subtracting the Nil value from either TB antigen (Ag) tube. The mitogen tube serves as a control for the test.   . QuantiFERON TB1 Ag Value 07/14/2019 0.37  IU/mL Final  . QuantiFERON TB2 Ag Value 07/14/2019 0.36  IU/mL Final  . QuantiFERON Nil Value 07/14/2019 0.34  IU/mL Final  . QuantiFERON Mitogen Value 07/14/2019 >10.00  IU/mL Final  . QuantiFERON-TB Gold Plus 07/14/2019 Negative  Negative Final   Chemiluminescence immunoassay methodology    Assessment:  Beverly Campbell is a 55 y.o. female  from [place] with a long standing history of anemia.  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  Oral iron causes diarrhea.  EGD on 01/01/2016 was normal.  Duodenal biopsy was negative   Colonoscopy on 01/01/2016 revealed diverticulosis in the entire colon and non-bleeding internal hemorrhoids.  She denies any melena or hematochezia.  Diet is good.  She has a history of of ice pica.  Normal labs on 08/24/2015 and 09/11/2016 included:  B12, folate, and TSH.  von Willebrand panel iwas normal on 08/18/2017.  She received Feraheme 1020 mg on 04/05/2013.  Ferritin improved from 22 to 273.  Hematocrit increased to 32.3.  Microcytic indices persisted.  She received Feraheme 510 mg on 09/20/2015, 04/17/2016,  04/23/2016, 03/19/2017, and 08/20/2017.  Ferritin has been followed: 8 on 08/24/2015, 98 on 10/12/2015, 6 on 03/01/2016, 5 on 04/17/2016, 77 on 06/03/2016, 11 on 09/11/2016,  45 on 12/20/2016, 9 on 03/17/2017, 24 on 08/18/2017, 124 on 02/04/2018, and 86 on 02/22/2019.  Symptomatically, ***  Plan: 1.   Review [facility]  labs   2.   Iron deficiency anemia  Hematocrit 37.8.  Hemoglobin 12.0.  MCV 65.0. Ferritin 86  (normal). No Venofer needed. 3.   Microcytic RBC indices             Patient has hemoglobin E disease.  She has chronic microcytic RBC indices.   No intervention needed. 4.   [facility] lab slip in 6 months and 1 year (CBC with diff, ferritin).   5.   RTC in 1 year for MD assessment and review of [facility] labs.   I discussed the assessment and treatment plan with the patient.  The patient was provided an opportunity to ask questions and all were answered.  The patient agreed with the plan and demonstrated an understanding of the instructions.  The patient was advised to call back if  the symptoms worsen or if the condition fails to improve as anticipated.  I provided *** minutes of face-to-face time during this this encounter and > 50% was spent counseling as documented under my assessment and plan.  Lequita Asal, MD, PhD    02/23/2020, 1:10 PM  I, Mirian Mo Tufford, am acting as a Education administrator for Lequita Asal, MD.  I, [place] Mike Gip, MD, have reviewed the above documentation for accuracy and completeness, and I agree with the above.

## 2020-02-24 ENCOUNTER — Ambulatory Visit: Payer: Managed Care, Other (non HMO) | Admitting: Hematology and Oncology

## 2020-02-25 ENCOUNTER — Encounter: Payer: Self-pay | Admitting: Hematology and Oncology

## 2020-03-08 ENCOUNTER — Ambulatory Visit: Payer: Managed Care, Other (non HMO) | Admitting: Internal Medicine

## 2020-03-14 ENCOUNTER — Other Ambulatory Visit: Payer: Self-pay

## 2020-03-14 ENCOUNTER — Ambulatory Visit: Payer: Managed Care, Other (non HMO) | Admitting: Internal Medicine

## 2020-03-14 DIAGNOSIS — J4520 Mild intermittent asthma, uncomplicated: Secondary | ICD-10-CM

## 2020-03-15 ENCOUNTER — Other Ambulatory Visit: Payer: Self-pay

## 2020-03-15 ENCOUNTER — Ambulatory Visit: Payer: Managed Care, Other (non HMO) | Admitting: Internal Medicine

## 2020-03-15 DIAGNOSIS — J4520 Mild intermittent asthma, uncomplicated: Secondary | ICD-10-CM

## 2020-03-15 MED ORDER — BUDESONIDE-FORMOTEROL FUMARATE 80-4.5 MCG/ACT IN AERO: 2.0000 | INHALATION_SPRAY | Freq: Two times a day (BID) | RESPIRATORY_TRACT | 3 refills | Status: DC

## 2020-03-15 MED ORDER — ALBUTEROL SULFATE HFA 108 (90 BASE) MCG/ACT IN AERS: 2.0000 | INHALATION_SPRAY | Freq: Four times a day (QID) | RESPIRATORY_TRACT | 3 refills | Status: DC | PRN

## 2020-03-27 ENCOUNTER — Other Ambulatory Visit: Payer: Self-pay

## 2020-03-27 ENCOUNTER — Inpatient Hospital Stay: Payer: Managed Care, Other (non HMO) | Attending: Hematology and Oncology | Admitting: Hematology and Oncology

## 2020-03-27 ENCOUNTER — Encounter: Payer: Self-pay | Admitting: Hematology and Oncology

## 2020-03-27 VITALS — BP 128/98 | HR 71 | Temp 98.1°F | Resp 18 | Wt 180.1 lb

## 2020-03-27 DIAGNOSIS — D509 Iron deficiency anemia, unspecified: Secondary | ICD-10-CM | POA: Insufficient documentation

## 2020-03-27 NOTE — Progress Notes (Signed)
[facility]  [address] [place], [place] [address] Phone: [phone]  Fax: [phone]   Clinic Day:  03/27/2020  Referring physician: Ronnell Freshwater, NP  Chief Complaint: Beverly Campbell is a 55 y.o. female with iron deficiency anemia who is seen for 1 year assessment.   HPI: The patient was last seen in the hematology clinic on 02/25/2019 via telemedicine. At that time, she felt good. She had lingering URI symptoms. She denied any bleeding. [facility] labs showed a hematocrit of 37.8, hemoglobin 12.0, platelets 214,000, WBC 5,800.  Ferritin was 86.  PFTs on 03/17/2019 were suggestive of mild restrictive lung disease.  Screening bilateral mammogram on 04/16/2019 revealed no evidence of malignancy.  CXR on 05/13/2019 revealed mild basilar airways thickening, similar to priors which could reflect mild chronic bronchitis or reactive airways disease. There were no other acute cardiopulmonary findings.  Chest CT without contrast on 06/08/2019 revealed diffuse bronchial wall thickening with scattered areas of small airway impaction in the lower lobes bilaterally. Imaging features may be related to an infectious/inflammatory etiology. There was a 4 mm subpleural right middle lobe pulmonary nodule. No follow-up needed if patient is low-risk. Non-contrast chest CT can be considered in 12 months if patient is high-risk.   The patient saw Leretha Pol, NP on 11/12/2019 for a routine follow-up. Her mild intermittent asthma was well managed on symbicort twice daily and a rescue inhaler prn.   [facility] labs on 03/24/2020 revealed a hematocrit of 37.4, hemoglobin 12.1, platelets 223,000, WBC 4,500. Ferritin was 182.  During the interim, she has been "good." She eats iron-rich foods. She denies bleeding of any kind, ice pica, and restless legs. Her last period was about 2 years ago. She has hot flashes. Her fatigue has resolved.  She uses an inhaler for  asthma. She uses it about once per day and some days she does not need to use it at all.   Past Medical History:  Diagnosis Date  . Anemia   . Hypothyroidism   . Thyroid disease     Past Surgical History:  Procedure Laterality Date  . COLONOSCOPY WITH PROPOFOL N/A 01/01/2016   Procedure: COLONOSCOPY WITH PROPOFOL;  Surgeon: Jonathon Bellows, MD;  Location: [facility] ENDOSCOPY;  Service: Endoscopy;  Laterality: N/A;  . ESOPHAGOGASTRODUODENOSCOPY (EGD) WITH PROPOFOL N/A 01/01/2016   Procedure: ESOPHAGOGASTRODUODENOSCOPY (EGD) WITH PROPOFOL;  Surgeon: Jonathon Bellows, MD;  Location: [facility] ENDOSCOPY;  Service: Endoscopy;  Laterality: N/A;  . THYROID SURGERY  1997   had thyroid removed  . TUBAL LIGATION  1995    Family History  Problem Relation Age of Onset  . Colon cancer Father   . Hypertension Mother   . Diabetes Mother     Social History:  reports that she has never smoked. She has never used smokeless tobacco. She reports current alcohol use. She reports that she does not use drugs. The patient is from [place].  She likes to be called "[name]"; pronounced like Joy with an "L". She came to the [place] in 1980.  She lives in [place].  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  She has 3 children (ages 26, 94, 58) who are alive and well. No family history of anemia or thalassesmia. The patient is alone today.  Allergies: No Known Allergies  Current Medications: Current Outpatient Medications  Medication Sig Dispense Refill  . albuterol (PROVENTIL) (5 MG/ML) 0.5% nebulizer solution Take 0.5 mLs (2.5 mg total) by nebulization every 6 (six) hours  as needed for wheezing or shortness of breath. 20 mL 12  . albuterol (VENTOLIN HFA) 108 (90 Base) MCG/ACT inhaler Inhale 2 puffs into the lungs every 6 (six) hours as needed for wheezing or shortness of breath. 18 g 3  . budesonide-formoterol (SYMBICORT) 80-4.5 MCG/ACT inhaler Inhale 2 puffs into the lungs 2 (two) times daily. 1 each 3  . levothyroxine  (SYNTHROID) 100 MCG tablet Take 1 tablet (100 mcg total) by mouth daily. 90 tablet 1  . Multiple Vitamins-Iron (MULTIVITAMIN/IRON PO) Take by mouth.    . triamcinolone (KENALOG) 0.025 % cream Apply 1 application topically 2 (two) times daily. 80 g 2   No current facility-administered medications for this visit.    Review of Systems  Constitutional: Negative.  Negative for chills, diaphoresis, fever and malaise/fatigue.       Feels "good."  HENT: Negative.  Negative for congestion, ear discharge, ear pain, hearing loss, nosebleeds, sinus pain, sore throat and tinnitus.   Eyes: Negative.  Negative for blurred vision, double vision, photophobia and pain.  Respiratory: Negative for cough, hemoptysis, sputum production and shortness of breath.        Asthma  Cardiovascular: Negative.  Negative for chest pain, palpitations, orthopnea, leg swelling and PND.  Gastrointestinal: Negative.  Negative for abdominal pain, blood in stool, constipation, diarrhea, heartburn, melena, nausea and vomiting.       Eats iron rich foods.  Genitourinary: Negative.  Negative for dysuria, frequency, hematuria and urgency.       No periods.  Musculoskeletal: Negative.  Negative for back pain, falls, joint pain, myalgias and neck pain.  Skin: Negative.  Negative for itching and rash.  Neurological: Negative.  Negative for dizziness, tingling, tremors, sensory change, speech change, focal weakness, weakness and headaches.  Endo/Heme/Allergies: Does not bruise/bleed easily.       HYPOthyroidism on levothyroxine. Hot flashes.  Psychiatric/Behavioral: Negative.  Negative for depression and memory loss. The patient is not nervous/anxious and does not have insomnia.   All other systems reviewed and are negative.  Performance status (ECOG): 0  Blood pressure (!) 128/98, pulse 71, temperature 98.1 F (36.7 C), temperature source Tympanic, resp. rate 18, weight 180 lb 1.9 oz (81.7 kg), unknown if currently breastfeeding.    Physical Exam Nursing note reviewed.  Constitutional:      General: She is not in acute distress.    Appearance: She is well-developed. She is not diaphoretic.  HENT:     Head: Normocephalic and atraumatic.     Comments: Long black hair.    Mouth/Throat:     Mouth: Mucous membranes are moist.     Pharynx: Oropharynx is clear.  Eyes:     General: No scleral icterus.    Conjunctiva/sclera: Conjunctivae normal.     Pupils: Pupils are equal, round, and reactive to light.     Comments: Glasses.  Brown eyes.  Cardiovascular:     Rate and Rhythm: Normal rate and regular rhythm.  Pulmonary:     Effort: Pulmonary effort is normal. No respiratory distress.     Breath sounds: Normal breath sounds. No wheezing, rhonchi or rales.  Chest:  Breasts:     Right: No axillary adenopathy or supraclavicular adenopathy.     Left: No axillary adenopathy or supraclavicular adenopathy.    Abdominal:     General: Abdomen is flat. Bowel sounds are normal. There is no distension.     Palpations: Abdomen is soft. There is no mass.     Tenderness: There is no  abdominal tenderness. There is no guarding or rebound.  Musculoskeletal:        General: No swelling or tenderness.  Lymphadenopathy:     Head:     Right side of head: No preauricular, posterior auricular or occipital adenopathy.     Left side of head: No preauricular, posterior auricular or occipital adenopathy.     Cervical: No cervical adenopathy.     Upper Body:     Right upper body: No supraclavicular or axillary adenopathy.     Left upper body: No supraclavicular or axillary adenopathy.     Lower Body: No right inguinal adenopathy. No left inguinal adenopathy.  Skin:    General: Skin is dry.     Coloration: Skin is not jaundiced or pale.     Findings: No bruising, erythema or rash.  Neurological:     Mental Status: She is alert and oriented to person, place, and time.  Psychiatric:        Mood and Affect: Mood normal.         Behavior: Behavior normal.        Thought Content: Thought content normal.        Judgment: Judgment normal.    No visits with results within 3 Day(s) from this visit.  Latest known visit with results is:  Office Visit on 07/06/2019  Component Date Value Ref Range Status  . IgE (Immunoglobulin E), Serum 07/14/2019 26  6 - 495 IU/mL Final  . Aspergillus fumigatus IgE 07/14/2019 <0.10  <0.35 kU/L Final  . A. Fumigatus Class Interp 07/14/2019 0   Final  . Aspergillus [place] IgE 07/14/2019 <0.10  <0.35 kU/L Final  . A. [place] Class Interp 07/14/2019 0   Final  . Aspergillus versicolor IgE 07/14/2019 <0.10  <0.35 kU/L Final  . A. Versicolor Class Interp 07/14/2019 0   Final   Comment: The test method is the [employer] ImmunoCAP allergen-specific IgE system. CLASS INTERPRETATION   <0.10 kU/L= 0, Negative; 0.10 - 0.34 kU/L= 0/1, Equivocal/Borderline; 0.35 - 0.69  kU/L=1, Low Positive; 0.70 - 3.49 kU/L=2, Moderate Positive;  3.50  - 17.49 kU/L=3, High Positive; 17.50 - 49.99 kU/L= 4, Very High Positive; 50.00 - 99.99 kU/L= 5, Very High Positive;   >99.99 kU/L=6, Very High Positive   . Aspergillus amstel/glaucu IgE* 07/14/2019 <0.35  <0.35 kU/L Final  . A. Amstel/Glaucu Class Interp 07/14/2019 0   Final  . Aspergillus flavus IgE 07/14/2019 <0.35  <0.35 kU/L Final  . A. Flavus Class Interp 07/14/2019 0   Final  . Aspergillus nidulans IgE 07/14/2019 <0.35  <0.35 kU/L Final  . A. Nidulans Class Interp 07/14/2019 0   Final   Comment: This conventional EIA uses allergen-coated discs from several suppliers and an enzyme-labeled anti-IgE. CLASS INTERPRETATION: <0.35 kU/L=0, Below Detection; 0.35-0.69 kU/L= 1, Low Positive; 0.70-3.49 kU/L= 2, Moderate Positive; 3.50-17.49 kU/L= 3, Positive; 17.50-49.99 kU/L= 4, Strong Positive; 50.00-99.99 kU/L= 5, Very Strong Positive; >99.99 kU/L= 6, Very Strong Positive *This test was developed and its performance characteristics determined by [employer] [facility]. It  has not been cleared or approved by the U.S. Food and Drug Administration.   . Aspergillus fumigatus, IgG 07/14/2019 Negative  Neg:<1:1 Final  . Aspergillus flavus 07/14/2019 Negative  Neg:<1:1 Final  . Aspergillus [place] 07/14/2019 Negative  Neg:<1:1 Final  . Aspergillus fumigatus IgG 07/14/2019 37.9* 0.0 - 1.9 ug/mL Final  . QuantiFERON Incubation 07/14/2019 Incubation performed.   Final  . QuantiFERON Criteria 07/14/2019 Comment   Final   Comment: The  QuantiFERON-TB Gold Plus result is determined by subtracting the Nil value from either TB antigen (Ag) tube. The mitogen tube serves as a control for the test.   . QuantiFERON TB1 Ag Value 07/14/2019 0.37  IU/mL Final  . QuantiFERON TB2 Ag Value 07/14/2019 0.36  IU/mL Final  . QuantiFERON Nil Value 07/14/2019 0.34  IU/mL Final  . QuantiFERON Mitogen Value 07/14/2019 >10.00  IU/mL Final  . QuantiFERON-TB Gold Plus 07/14/2019 Negative  Negative Final   Chemiluminescence immunoassay methodology    Assessment:  Jakia Fuston is a 55 y.o. female  from [place] with a long standing history of anemia.  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  Oral iron causes diarrhea.  EGD on 01/01/2016 was normal.  Duodenal biopsy was negative   Colonoscopy on 01/01/2016 revealed diverticulosis in the entire colon and non-bleeding internal hemorrhoids.  She denies any melena or hematochezia.  Diet is good.  She has a history of of ice pica.  Normal labs on 08/24/2015 and 09/11/2016 included:  B12, folate, and TSH.  von Willebrand panel iwas normal on 08/18/2017.  She received Feraheme 1020 mg on 04/05/2013.  Ferritin improved from 22 to 273.  Hematocrit increased to 32.3.  Microcytic indices persisted.  She received Feraheme 510 mg on 09/20/2015, 04/17/2016,  04/23/2016, 03/19/2017, and 08/20/2017.  Ferritin has been followed: 8 on 08/24/2015, 98 on 10/12/2015, 6 on 03/01/2016, 5 on  04/17/2016, 77 on 06/03/2016, 11 on 09/11/2016,  45 on 12/20/2016, 9 on 03/17/2017, 24 on 08/18/2017, 124 on 02/04/2018, and 86 on 02/22/2019.  She has not received the COVID-19 vaccine.  Symptomatically, she feels "good."  Fatigue has resolved.  She eats iron-rich foods. She denies bleeding of any kind, ice pica, and restless legs. Her last period was about 2 years ago.  Exam is stable.  Plan: 1.   Review [facility] labs 2.   Iron deficiency anemia  Hematocrit 37.4.  Hemoglobin 12.1.  MCV 62.0. Ferritin  182  (normal). No Venofer needed. 3.   Microcytic RBC indices             Patient has hemoglobin E disease.  She has chronic microcytic RBC indices.   No intervention needed. 4.   RTC prn.  I discussed the assessment and treatment plan with the patient.  The patient was provided an opportunity to ask questions and all were answered.  The patient agreed with the plan and demonstrated an understanding of the instructions.  The patient was advised to call back if the symptoms worsen or if the condition fails to improve as anticipated.   Lequita Asal, MD, PhD    03/27/2020, 4:05 PM  I, Evert Kohl, am acting as a Education administrator for Lequita Asal, MD.  I, [place] Mike Gip, MD, have reviewed the above documentation for accuracy and completeness, and I agree with the above.

## 2020-03-27 NOTE — Progress Notes (Signed)
Patient denies any concerns today.  

## 2020-03-28 ENCOUNTER — Ambulatory Visit: Payer: Managed Care, Other (non HMO) | Admitting: Internal Medicine

## 2020-04-03 ENCOUNTER — Ambulatory Visit: Payer: Managed Care, Other (non HMO) | Admitting: Physician Assistant

## 2020-04-03 ENCOUNTER — Encounter: Payer: Self-pay | Admitting: Physician Assistant

## 2020-04-03 ENCOUNTER — Other Ambulatory Visit: Payer: Self-pay

## 2020-04-03 DIAGNOSIS — B4481 Allergic bronchopulmonary aspergillosis: Secondary | ICD-10-CM

## 2020-04-03 DIAGNOSIS — J301 Allergic rhinitis due to pollen: Secondary | ICD-10-CM | POA: Diagnosis not present

## 2020-04-03 DIAGNOSIS — R911 Solitary pulmonary nodule: Secondary | ICD-10-CM | POA: Diagnosis not present

## 2020-04-03 DIAGNOSIS — J4520 Mild intermittent asthma, uncomplicated: Secondary | ICD-10-CM

## 2020-04-03 MED ORDER — VORICONAZOLE 200 MG PO TABS
200.0000 mg | ORAL_TABLET | Freq: Two times a day (BID) | ORAL | 3 refills | Status: DC
Start: 2020-04-03 — End: 2020-05-02

## 2020-04-03 NOTE — Progress Notes (Signed)
[facility] [place], [place] [address]  Pulmonary Sleep Medicine   Office Visit Note  Patient Name: Beverly Campbell DOB: 1965-06-22 MRN [id number]  Date of Service: 04/04/2020  Complaints/HPI: asthma/PFT f/u  Pt returns for pulmonary f/u to review PFTs and lab results. She has no complaints, but does report some more wheezing with weather changes. Gets congested and starts having more sinus symptoms. She is not taking any antihistamine or nasal sprays currently. Reports she never had any issues with breathing or allergies prior to the last year or so. Discussed that her aspergillus fumigatus IgG came back elevated. PFTs improved from last year.  ROS  General: (-) fever, (-) chills, (-) night sweats, (-) weakness Skin: (-) rashes, (-) itching,. Eyes: (-) visual changes, (-) redness, (-) itching. Nose and Sinuses: (-) nasal stuffiness or itchiness, (-) postnasal drip, (-) nosebleeds, (-) sinus trouble. Mouth and Throat: (-) sore throat, (-) hoarseness. Neck: (-) swollen glands, (-) enlarged thyroid, (-) neck pain. Respiratory: - cough, (-) bloody sputum, - shortness of breath, - wheezing. Cardiovascular: - ankle swelling, (-) chest pain. Lymphatic: (-) lymph node enlargement. Neurologic: (-) numbness, (-) tingling. Psychiatric: (-) anxiety, (-) depression   Current Medication: Outpatient Encounter Medications as of 04/03/2020  Medication Sig  . albuterol (PROVENTIL) (5 MG/ML) 0.5% nebulizer solution Take 0.5 mLs (2.5 mg total) by nebulization every 6 (six) hours as needed for wheezing or shortness of breath.  Marland Kitchen albuterol (VENTOLIN HFA) 108 (90 Base) MCG/ACT inhaler Inhale 2 puffs into the lungs every 6 (six) hours as needed for wheezing or shortness of breath.  . budesonide-formoterol (SYMBICORT) 80-4.5 MCG/ACT inhaler Inhale 2 puffs into the lungs 2 (two) times daily.  Marland Kitchen levothyroxine (SYNTHROID) 100 MCG tablet Take 1 tablet (100 mcg total) by  mouth daily.  . Multiple Vitamins-Iron (MULTIVITAMIN/IRON PO) Take by mouth.  . triamcinolone (KENALOG) 0.025 % cream Apply 1 application topically 2 (two) times daily.  Marland Kitchen voriconazole (VFEND) 200 MG tablet Take 1 tablet (200 mg total) by mouth 2 (two) times daily.   No facility-administered encounter medications on file as of 04/03/2020.    Surgical History: Past Surgical History:  Procedure Laterality Date  . COLONOSCOPY WITH PROPOFOL N/A 01/01/2016   Procedure: COLONOSCOPY WITH PROPOFOL;  Surgeon: Jonathon Bellows, MD;  Location: [facility] ENDOSCOPY;  Service: Endoscopy;  Laterality: N/A;  . ESOPHAGOGASTRODUODENOSCOPY (EGD) WITH PROPOFOL N/A 01/01/2016   Procedure: ESOPHAGOGASTRODUODENOSCOPY (EGD) WITH PROPOFOL;  Surgeon: Jonathon Bellows, MD;  Location: [facility] ENDOSCOPY;  Service: Endoscopy;  Laterality: N/A;  . THYROID SURGERY  1997   had thyroid removed  . TUBAL LIGATION  1995    Medical History: Past Medical History:  Diagnosis Date  . Anemia   . Hypothyroidism   . Thyroid disease     Family History: Family History  Problem Relation Age of Onset  . Colon cancer Father   . Hypertension Mother   . Diabetes Mother     Social History: Social History   Socioeconomic History  . Marital status: Divorced    Spouse name: Not on file  . Number of children: Not on file  . Years of education: Not on file  . Highest education level: Not on file  Occupational History  . Not on file  Tobacco Use  . Smoking status: Never Smoker  . Smokeless tobacco: Never Used  Substance and Sexual Activity  . Alcohol use: Yes    Comment: occasionally  . Drug use: No  . Sexual activity: Yes  Birth control/protection: None  Other Topics Concern  . Not on file  Social History Narrative  . Not on file   Social Determinants of Health   Financial Resource Strain: Not on file  Food Insecurity: Not on file  Transportation Needs: Not on file  Physical Activity: Not on file  Stress: Not on file   Social Connections: Not on file  Intimate Partner Violence: Not on file    Vital Signs: Blood pressure 138/90, pulse 90, temperature (!) 97.5 F (36.4 C), resp. rate 16, height 5\' 1"  (1.549 m), weight 185 lb 3.2 oz (84 kg), SpO2 97 %, unknown if currently breastfeeding.  Examination: General Appearance: The patient is well-developed, well-nourished, and in no distress. Skin: Gross inspection of skin unremarkable. Head: normocephalic, no gross deformities. Eyes: no gross deformities noted. ENT: ears appear grossly normal no exudates. Neck: Supple. No thyromegaly. No LAD. Respiratory: Normal respiratory effort, lungs clear in all fields. Cardiovascular: Normal S1 and S2 without murmur or rub. Extremities: No cyanosis. pulses are equal. Neurologic: Alert and oriented. No involuntary movements.  LABS: No results found for this or any previous visit (from the past 2160 hour(s)).  Radiology: CT CHEST WO CONTRAST  Result Date: 06/08/2019 CLINICAL DATA:  Cough with persistent shortness of breath. EXAM: CT CHEST WITHOUT CONTRAST TECHNIQUE: Multidetector CT imaging of the chest was performed following the standard protocol without IV contrast. COMPARISON:  None. FINDINGS: Cardiovascular: The heart size is normal. No substantial pericardial effusion. No thoracic aortic aneurysm. Mediastinum/Nodes: No mediastinal lymphadenopathy. No evidence for gross hilar lymphadenopathy although assessment is limited by the lack of intravenous contrast on today's study. The esophagus has normal imaging features. There is no axillary lymphadenopathy. Lungs/Pleura: 4 mm subpleural nodule identified right middle lobe on image 76/3. Mild bronchial wall thickening noted bilaterally with areas of small airway impaction in the lower lobes. No focal airspace consolidation. No pleural effusion. Upper Abdomen: 13 mm low-density lesion lateral segment left liver cannot be definitively characterized but is likely benign.  Similar 9 mm low-density lesion in the medial segment left liver approaches water density and is likely a cyst. Musculoskeletal: No worrisome lytic or sclerotic osseous abnormality. IMPRESSION: Diffuse bronchial wall thickening with scattered areas of small airway impaction in the lower lobes bilaterally. Imaging features may be related to an infectious/inflammatory etiology. 4 mm subpleural right middle lobe pulmonary nodule. No follow-up needed if patient is low-risk. Non-contrast chest CT can be considered in 12 months if patient is high-risk. This recommendation follows the consensus statement: Guidelines for Management of Incidental Pulmonary Nodules Detected on CT Images: From the Fleischner Society 2017; Radiology 2017; 284:228-243. Electronically Signed   By: Misty Stanley M.D.   On: 06/08/2019 20:30    No results found.  No results found.    Assessment and Plan: Patient Active Problem List   Diagnosis Date Noted  . Mild intermittent asthma without complication 76/22/6333  . Atopic dermatitis 12/04/2019  . Body mass index (BMI) of 33.0-33.9 in adult 12/04/2019  . Encounter for general adult medical examination with abnormal findings 05/23/2019  . Shortness of breath 05/23/2019  . Routine cervical smear 05/23/2019  . Other fatigue 05/07/2018  . Wheezing 05/07/2018  . Respiratory infection 04/27/2018  . Cough 04/27/2018  . Screening for breast cancer 10/19/2017  . Ganglion cyst of finger 10/19/2017  . Acquired hypothyroidism 10/19/2017  . Vitamin D deficiency 10/19/2017  . Dysuria 10/19/2017  . Internal hemorrhoids   . Diverticulosis of large intestine without diverticulitis   .  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 Anemia 09/13/2015  . Intramural leiomyoma of uterus 11/09/2014  . Abnormal perimenopausal bleeding 11/09/2014  . Disease of thyroid gland 10/26/2014  . IDA (iron deficiency anemia) 03/29/2013    1. Mild intermittent asthma without complication Continue  inhalers as prescribed. FEV1 1.89, 88% predicted  2. Allergic bronchopulmonary aspergillosis (HCC) IgG high, will obtain LFTs and if normal begin treatment with voriconazole. Pt will need weekly monitoring of LFTs for 4 weeks and then monthly with periodic renal function testing. Lab slip given today. - voriconazole (VFEND) 200 MG tablet; Take 1 tablet (200 mg total) by mouth 2 (two) times daily.  Dispense: 60 tablet; Refill: 3  3. Seasonal allergic rhinitis due to pollen Recommend she try flonase and antihistamine.  4. Right middle lobe pulmonary nodule Pt will need repeat CT chest around May 2022 for f/u on previous CT finding. Pt is low risk.   General Counseling: I have discussed the findings of the evaluation and examination with Majesty.  I have also discussed any further diagnostic evaluation thatmay be needed or ordered today. Aneya verbalizes understanding of the findings of todays visit. We also reviewed her medications today and discussed drug interactions and side effects including but not limited excessive drowsiness and altered mental states. We also discussed that there is always a risk not just to her but also people around her. she has been encouraged to call the office with any questions or concerns that should arise related to todays visit.  No orders of the defined types were placed in this encounter.    Time spent: 30  I have personally obtained a history, examined the patient, evaluated laboratory and imaging results, formulated the assessment and plan and placed orders. This patient was seen by Drema Dallas, PA-C, in collaboration with Dr. Devona Konig as a part of collaborative care agreement.     Allyne Gee, MD [facility] Pulmonary and Critical Care Sleep medicine

## 2020-04-06 ENCOUNTER — Other Ambulatory Visit: Payer: Self-pay | Admitting: Physician Assistant

## 2020-04-07 LAB — LIPID PANEL W/O CHOL/HDL RATIO
Cholesterol, Total: 185 mg/dL (ref 100–199)
HDL: 67 mg/dL (ref >39)
LDL Chol Calc (NIH): 97 mg/dL (ref 0–99)
Triglycerides: 118 mg/dL (ref 0–149)
VLDL Cholesterol Cal: 21 mg/dL (ref 5–40)

## 2020-04-07 LAB — FSH/LH
FSH: 83.8 m[IU]/mL
LH: 34.9 m[IU]/mL

## 2020-04-07 LAB — COMPREHENSIVE METABOLIC PANEL
ALT: 17 IU/L (ref 0–32)
AST: 19 IU/L (ref 0–40)
Albumin/Globulin Ratio: 1.6 (ref 1.2–2.2)
Albumin: 4.3 g/dL (ref 3.8–4.9)
Alkaline Phosphatase: 75 IU/L (ref 44–121)
BUN/Creatinine Ratio: 16 (ref 9–23)
BUN: 14 mg/dL (ref 6–24)
Bilirubin Total: 1 mg/dL (ref 0.0–1.2)
CO2: 21 mmol/L (ref 20–29)
Calcium: 9.3 mg/dL (ref 8.7–10.2)
Chloride: 103 mmol/L (ref 96–106)
Creatinine, Ser: 0.9 mg/dL (ref 0.57–1.00)
Globulin, Total: 2.7 g/dL (ref 1.5–4.5)
Glucose: 100 mg/dL — ABNORMAL HIGH (ref 65–99)
Potassium: 3.8 mmol/L (ref 3.5–5.2)
Sodium: 142 mmol/L (ref 134–144)
Total Protein: 7 g/dL (ref 6.0–8.5)
eGFR: 76 mL/min/{1.73_m2} (ref >59)

## 2020-04-07 LAB — T4, FREE: Free T4: 1.56 ng/dL (ref 0.82–1.77)

## 2020-04-07 LAB — TSH: TSH: 1.43 u[IU]/mL (ref 0.450–4.500)

## 2020-04-07 LAB — VITAMIN D 25 HYDROXY (VIT D DEFICIENCY, FRACTURES): Vit D, 25-Hydroxy: 24.7 ng/mL — ABNORMAL LOW (ref 30.0–100.0)

## 2020-05-02 ENCOUNTER — Encounter: Payer: Self-pay | Admitting: Hospice and Palliative Medicine

## 2020-05-02 ENCOUNTER — Ambulatory Visit (INDEPENDENT_AMBULATORY_CARE_PROVIDER_SITE_OTHER): Payer: Managed Care, Other (non HMO) | Admitting: Hospice and Palliative Medicine

## 2020-05-02 VITALS — BP 124/82 | HR 80 | Temp 97.4°F | Resp 16 | Ht 61.0 in | Wt 178.8 lb

## 2020-05-02 DIAGNOSIS — M549 Dorsalgia, unspecified: Secondary | ICD-10-CM

## 2020-05-02 DIAGNOSIS — E039 Hypothyroidism, unspecified: Secondary | ICD-10-CM

## 2020-05-02 DIAGNOSIS — Z0001 Encounter for general adult medical examination with abnormal findings: Secondary | ICD-10-CM

## 2020-05-02 DIAGNOSIS — R300 Dysuria: Secondary | ICD-10-CM

## 2020-05-02 DIAGNOSIS — G8929 Other chronic pain: Secondary | ICD-10-CM

## 2020-05-02 DIAGNOSIS — Z1231 Encounter for screening mammogram for malignant neoplasm of breast: Secondary | ICD-10-CM

## 2020-05-02 DIAGNOSIS — Z113 Encounter for screening for infections with a predominantly sexual mode of transmission: Secondary | ICD-10-CM

## 2020-05-02 DIAGNOSIS — B4481 Allergic bronchopulmonary aspergillosis: Secondary | ICD-10-CM | POA: Diagnosis not present

## 2020-05-02 DIAGNOSIS — Z124 Encounter for screening for malignant neoplasm of cervix: Secondary | ICD-10-CM | POA: Diagnosis not present

## 2020-05-02 DIAGNOSIS — M542 Cervicalgia: Secondary | ICD-10-CM | POA: Diagnosis not present

## 2020-05-02 MED ORDER — CYCLOBENZAPRINE HCL 5 MG PO TABS: 5.0000 mg | ORAL_TABLET | Freq: Three times a day (TID) | ORAL | 0 refills | Status: DC | PRN

## 2020-05-02 MED ORDER — VORICONAZOLE 200 MG PO TABS
200.0000 mg | ORAL_TABLET | Freq: Two times a day (BID) | ORAL | 3 refills | Status: DC
Start: 2020-05-02 — End: 2020-07-17

## 2020-05-02 NOTE — Progress Notes (Signed)
[facility] [place], [place] [address]  Internal MEDICINE  Office Visit Note  Patient Name: Beverly Campbell  [id number]  [id number]  Date of Service: 05/06/2020  Chief Complaint  Patient presents with  . Annual Exam  . 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 cyclobenzaprine (FLEXERIL) 5 MG tablet Take 1 tablet (5 mg total) by mouth 3 (three) times daily as needed for muscle spasms.  Marland Kitchen albuterol (PROVENTIL) (5 MG/ML) 0.5% nebulizer solution Take 0.5 mLs (2.5 mg total) by nebulization every 6 (six) hours as needed for wheezing or shortness of breath.  Marland Kitchen albuterol (VENTOLIN HFA) 108 (90 Base) MCG/ACT inhaler Inhale 2 puffs into the lungs every 6 (six) hours as needed for wheezing or shortness of breath.  . budesonide-formoterol (SYMBICORT) 80-4.5 MCG/ACT inhaler Inhale 2 puffs into the lungs 2 (two) times daily.  Marland Kitchen levothyroxine (SYNTHROID) 100 MCG tablet Take 1 tablet (100 mcg total) by mouth daily.  . Multiple Vitamins-Iron (MULTIVITAMIN/IRON PO) Take by mouth.  . triamcinolone (KENALOG) 0.025 % cream Apply 1 application topically 2 (two) times daily.  Marland Kitchen voriconazole (VFEND) 200 MG tablet Take 1 tablet (200 mg total) by mouth 2 (two) times daily.  . [DISCONTINUED]  voriconazole (VFEND) 200 MG tablet Take 1 tablet (200 mg total) by mouth 2 (two) times daily.   No facility-administered encounter medications on file as of 05/02/2020.    Surgical History: Past Surgical History:  Procedure Laterality Date  . COLONOSCOPY WITH PROPOFOL N/A 01/01/2016   Procedure: COLONOSCOPY WITH PROPOFOL;  Surgeon: Jonathon Bellows, MD;  Location: [facility] ENDOSCOPY;  Service: Endoscopy;  Laterality: N/A;  . ESOPHAGOGASTRODUODENOSCOPY (EGD) WITH PROPOFOL N/A 01/01/2016   Procedure: ESOPHAGOGASTRODUODENOSCOPY (EGD) WITH PROPOFOL;  Surgeon: Jonathon Bellows, MD;  Location: [facility] ENDOSCOPY;  Service: Endoscopy;  Laterality: N/A;  . THYROID SURGERY  1997   had thyroid removed  . TUBAL LIGATION  1995    Medical History: Past Medical History:  Diagnosis Date  . Anemia   . Hypothyroidism   . Thyroid disease     Family History: Family History  Problem Relation Age of Onset  . Colon cancer Father   . Hypertension Mother   . Diabetes Mother       Review of Systems  Constitutional: Negative for chills, diaphoresis and fatigue.  HENT: Negative for ear pain, postnasal drip and sinus pressure.   Eyes: Negative for photophobia, discharge, redness, itching and visual disturbance.  Respiratory: Negative for cough, shortness of breath and wheezing.   Cardiovascular: Negative for chest pain, palpitations and leg swelling.  Gastrointestinal: Negative for abdominal pain, constipation, diarrhea, nausea and vomiting.  Genitourinary: Negative for dysuria and flank pain.  Musculoskeletal: Positive for neck pain and neck stiffness. Negative for arthralgias, back pain and gait problem.  Skin: Negative for color change.  Allergic/Immunologic: Negative for environmental allergies and  food allergies.  Neurological: Negative for dizziness and headaches.  Hematological: Does not bruise/bleed easily.  Psychiatric/Behavioral: Negative for agitation, behavioral problems (depression) and hallucinations.      Vital Signs: BP 124/82   Pulse 80   Temp (!) 97.4 F (36.3 C)   Resp 16   Ht $R'5\' 1"'Ip$  (1.549 m)   Wt 178 lb 12.8 oz (81.1 kg)   SpO2 97%   BMI 33.78 kg/m    Physical Exam Vitals reviewed. Exam conducted with a chaperone present.  Constitutional:      Appearance: Normal appearance. She is obese.  Cardiovascular:     Rate and Rhythm: Normal rate and regular rhythm.     Pulses: Normal pulses.     Heart sounds: Normal heart sounds.  Pulmonary:     Effort: Pulmonary effort is normal.     Breath sounds: Normal breath sounds.  Abdominal:     General: Abdomen is flat.     Palpations: Abdomen is soft.  Genitourinary:    Vagina: Normal.     Cervix: Normal.     Uterus: Normal.      Adnexa: Right adnexa normal and left adnexa normal.  Musculoskeletal:        General: Normal range of motion.     Cervical back: Normal range of motion.  Skin:    General: Skin is warm.  Neurological:     General: No focal deficit present.     Mental Status: She is alert and oriented to person, place, and time. Mental status is at baseline.  Psychiatric:        Mood and Affect: Mood normal.        Behavior: Behavior normal.        Thought Content: Thought content normal.        Judgment: Judgment normal.      LABS: Recent Results (from the past 2160 hour(s))  Comprehensive metabolic panel     Status: Abnormal   Collection Time: 04/06/20  2:55 PM  Result Value Ref Range   Glucose 100 (H) 65 - 99 mg/dL   BUN 14 6 - 24 mg/dL   Creatinine, Ser 0.90 0.57 - 1.00 mg/dL   eGFR 76 >59 mL/min/1.73   BUN/Creatinine Ratio 16 9 - 23   Sodium 142 134 - 144 mmol/L   Potassium 3.8 3.5 - 5.2 mmol/L   Chloride 103 96 - 106 mmol/L   CO2 21 20 - 29 mmol/L   Calcium 9.3 8.7 - 10.2 mg/dL   Total Protein 7.0 6.0 - 8.5 g/dL   Albumin 4.3 3.8 - 4.9 g/dL   Globulin, Total 2.7 1.5 - 4.5 g/dL   Albumin/Globulin Ratio 1.6 1.2 - 2.2   Bilirubin Total 1.0 0.0 - 1.2 mg/dL   Alkaline Phosphatase 75 44 - 121  IU/L   AST 19 0 - 40 IU/L   ALT 17 0 - 32 IU/L  Lipid Panel w/o Chol/HDL Ratio     Status: None   Collection Time: 04/06/20  2:55 PM  Result Value Ref Range   Cholesterol, Total 185 100 - 199 mg/dL   Triglycerides 118 0 - 149 mg/dL   HDL 67 >39 mg/dL   VLDL Cholesterol Cal 21 5 - 40 mg/dL   LDL Chol Calc (NIH) 97 0 - 99 mg/dL  FSH/LH     Status: None   Collection Time: 04/06/20  2:55 PM  Result Value Ref Range   LH 34.9 mIU/mL    Comment:  Adult Female:                       Follicular phase      2.4 -  12.6                       Ovulation phase      14.0 -  95.6                       Luteal phase          1.0 -  11.4                       Postmenopausal        7.7 -  58.5    FSH 83.8 mIU/mL    Comment:                     Adult Female:                       Follicular phase      3.5 -  12.5                       Ovulation phase       4.7 -  21.5                       Luteal phase          1.7 -   7.7                       Postmenopausal       25.8 - 134.8   T4, free     Status: None   Collection Time: 04/06/20  2:55 PM  Result Value Ref Range   Free T4 1.56 0.82 - 1.77 ng/dL  TSH     Status: None   Collection Time: 04/06/20  2:55 PM  Result Value Ref Range   TSH 1.430 0.450 - 4.500 uIU/mL  VITAMIN D 25 Hydroxy (Vit-D Deficiency, Fractures)     Status: Abnormal   Collection Time: 04/06/20  2:55 PM  Result Value Ref Range   Vit D, 25-Hydroxy 24.7 (L) 30.0 - 100.0 ng/mL    Comment: Vitamin D deficiency has been defined by the [place] and an Endocrine Society practice guideline as a level of serum 25-OH vitamin D less than 20 ng/mL (1,2). The Endocrine Society went on to further define vitamin D insufficiency as a level between 21 and 29 ng/mL (2). 1. IOM (Institute of Medicine). 2010. Dietary reference    intakes for calcium and D. [place]: The    [employer]. 2. Holick MF, Binkley [place], Bischoff-Ferrari HA, et al.     Evaluation, treatment, and prevention of vitamin D    deficiency: an Endocrine Society clinical practice    guideline. JCEM. 2011 Jul; 96(7):1911-30.   UA/M w/rflx Culture, Routine     Status: None   Collection Time: 05/02/20  3:44 PM   Specimen: Urine   Urine  Result Value Ref Range   Specific Gravity, UA 1.021 1.005 - 1.030   pH, UA 5.5 5.0 - 7.5   Color, UA Yellow Yellow   Appearance Ur Clear Clear   Leukocytes,UA Negative Negative   Protein,UA Negative Negative/Trace   Glucose, UA Negative Negative  Ketones, UA Negative Negative   RBC, UA Negative Negative   Bilirubin, UA Negative Negative   Urobilinogen, Ur 0.2 0.2 - 1.0 mg/dL   Nitrite, UA Negative Negative   Microscopic Examination Comment     Comment: Microscopic follows if indicated.   Microscopic Examination See below:     Comment: Microscopic was indicated and was performed.   Urinalysis Reflex Comment     Comment: This specimen will not reflex to a Urine Culture.  Microscopic Examination     Status: None   Collection Time: 05/02/20  3:44 PM   Urine  Result Value Ref Range   WBC, UA None seen 0 - 5 /hpf   RBC 0-2 0 - 2 /hpf   Epithelial Cells (non renal) None seen 0 - 10 /hpf   Casts None seen None seen /lpf   Bacteria, UA None seen None seen/Few  IGP, Aptima HPV     Status: None   Collection Time: 05/02/20  3:49 PM  Result Value Ref Range   Interpretation NILM     Comment: NEGATIVE FOR INTRAEPITHELIAL LESION OR MALIGNANCY.   Category NIL     Comment: Negative for Intraepithelial Lesion   Adequacy SECNI     Comment: Satisfactory for evaluation. No endocervical component is identified.   Clinician Provided ICD10 Comment     Comment: Z12.4   Performed by: Comment     Comment: Shanon Brow, Cytotechnologist (ASCP)   Note: Comment     Comment: The Pap smear is a screening test designed to aid in the detection of premalignant and malignant conditions of the uterine cervix.  It is not a diagnostic  procedure and should not be used as the sole means of detecting cervical cancer.  Both false-positive and false-negative reports do occur.    Test Methodology Comment     Comment: This liquid based ThinPrep(R) pap test was screened with the use of an image guided system.    HPV Aptima Negative Negative    Comment: This nucleic acid amplification test detects fourteen high-risk HPV types (16,18,31,33,35,39,45,51,52,56,58,59,66,68) without differentiation.   NuSwab Vaginitis Plus (VG+)     Status: None   Collection Time: 05/02/20  3:49 PM  Result Value Ref Range   Atopobium vaginae Low - 0 Score   BVAB 2 Low - 0 Score   Megasphaera 1 Low - 0 Score    Comment: Calculate total score by adding the 3 individual bacterial vaginosis (BV) marker scores together.  Total score is interpreted as follows: Total score 0-1: Indicates the absence of BV. Total score   2: Indeterminate for BV. Additional clinical                  data should be evaluated to establish a                  diagnosis. Total score 3-6: Indicates the presence of BV. This test was developed and its performance characteristics determined by [facility].  It has not been cleared or approved by the Food and Drug Administration.    Candida albicans, NAA Negative Negative   Candida glabrata, NAA Negative Negative   Trich vag by NAA Negative Negative   Chlamydia trachomatis, NAA Negative Negative   Neisseria gonorrhoeae, NAA Negative Negative    Assessment/Plan: 1. Encounter for routine adult health examination with abnormal findings Well appearing 54 year old female Orders placed to update PHM  2. Encounter for screening mammogram for malignant neoplasm of breast - MM 3D  SCREEN BREAST BILATERAL; Future  3. Allergic bronchopulmonary aspergillosis ([place]) Start Vfend--sent to mail pharmacy Will need closely monitoring of liver and kidney function - voriconazole (VFEND) 200 MG tablet; Take 1 tablet (200 mg total) by mouth 2  (two) times daily.  Dispense: 60 tablet; Refill: 3  4. Chronic neck and back pain Encouraged to use heat therapy as well as acetaminophen as needed May use Flexeril as needed for pain Consider updated imaging - cyclobenzaprine (FLEXERIL) 5 MG tablet; Take 1 tablet (5 mg total) by mouth 3 (three) times daily as needed for muscle spasms.  Dispense: 90 tablet; Refill: 0  5. Acquired hypothyroidism Recent thyroid levels remain stable on current dosing, continue to monitor  6. Screen for STD (sexually transmitted disease) - NuSwab Vaginitis Plus (VG+)  7. Routine cervical smear - IGP, Aptima HPV  8. Dysuria - UA/M w/rflx Culture, Routine - Microscopic Examination  General Counseling: Ladona verbalizes understanding of the findings of todays visit and agrees with plan of treatment. I have discussed any further diagnostic evaluation that may be needed or ordered today. We also reviewed her medications today. she has been encouraged to call the office with any questions or concerns that should arise related to todays visit.    Counseling:    Orders Placed This Encounter  Procedures  . Microscopic Examination  . MM 3D SCREEN BREAST BILATERAL  . UA/M w/rflx Culture, Routine  . NuSwab Vaginitis Plus (VG+)    Meds ordered this encounter  Medications  . voriconazole (VFEND) 200 MG tablet    Sig: Take 1 tablet (200 mg total) by mouth 2 (two) times daily.    Dispense:  60 tablet    Refill:  3  . cyclobenzaprine (FLEXERIL) 5 MG tablet    Sig: Take 1 tablet (5 mg total) by mouth 3 (three) times daily as needed for muscle spasms.    Dispense:  90 tablet    Refill:  0    Total time spent: 30 Minutes  Time spent includes review of chart, medications, test results, and follow up plan with the patient.   This patient was seen by Theodoro Grist AGNP-C Collaboration with Dr Lavera Guise as a part of collaborative care agreement   Tanna Furry. [facility] Internal Medicine

## 2020-05-03 ENCOUNTER — Telehealth: Payer: Self-pay

## 2020-05-03 LAB — MICROSCOPIC EXAMINATION
Bacteria, UA: NONE SEEN
Casts: NONE SEEN /lpf
Epithelial Cells (non renal): NONE SEEN /hpf (ref 0–10)
WBC, UA: NONE SEEN /hpf (ref 0–5)

## 2020-05-03 LAB — UA/M W/RFLX CULTURE, ROUTINE
Bilirubin, UA: NEGATIVE
Glucose, UA: NEGATIVE
Ketones, UA: NEGATIVE
Leukocytes,UA: NEGATIVE
Nitrite, UA: NEGATIVE
Protein,UA: NEGATIVE
RBC, UA: NEGATIVE
Specific Gravity, UA: 1.021 (ref 1.005–1.030)
Urobilinogen, Ur: 0.2 mg/dL (ref 0.2–1.0)
pH, UA: 5.5 (ref 5.0–7.5)

## 2020-05-03 NOTE — Telephone Encounter (Signed)
PA for VORICONAZOLE 200 mg tabs was approved valid 04/15/20 to 04/15/21

## 2020-05-04 ENCOUNTER — Other Ambulatory Visit: Payer: Managed Care, Other (non HMO) | Admitting: Hospice and Palliative Medicine

## 2020-05-04 LAB — NUSWAB VAGINITIS PLUS (VG+)
Candida albicans, NAA: NEGATIVE
Candida glabrata, NAA: NEGATIVE
Chlamydia trachomatis, NAA: NEGATIVE
Neisseria gonorrhoeae, NAA: NEGATIVE
Trich vag by NAA: NEGATIVE

## 2020-05-05 LAB — IGP, APTIMA HPV: HPV Aptima: NEGATIVE

## 2020-05-06 ENCOUNTER — Encounter: Payer: Self-pay | Admitting: Hospice and Palliative Medicine

## 2020-05-06 NOTE — Progress Notes (Signed)
Normal PAP

## 2020-05-12 ENCOUNTER — Ambulatory Visit: Payer: Managed Care, Other (non HMO) | Admitting: Hospice and Palliative Medicine

## 2020-05-12 ENCOUNTER — Encounter: Payer: Self-pay | Admitting: Hospice and Palliative Medicine

## 2020-05-12 NOTE — Progress Notes (Incomplete)
[facility] [place], [place] [address]  Internal MEDICINE  Telephone Visit  Patient Name: Beverly Campbell  [id number]  [id number]  Date of Service: 05/12/2020  I connected with the patient at 1225 by telephone and verified the patients identity using two identifiers.   I discussed the limitations, risks, security and privacy concerns of performing an evaluation and management service by telephone and the availability of in person appointments. I also discussed with the patient that there may be a patient responsible charge related to the service.  The patient expressed understanding and agrees to proceed.    Chief Complaint  Patient presents with  . Acute Visit    Possible sinus infection, coughing up green/brown mucus, sinus pressure around eyes, irration in throat, nasal drainage, seasonal allergies, pt is taking OTC meds  . Telephone Assessment    [phone]   . Telephone Screen    Phone call    HPI     Current Medication: Outpatient Encounter Medications as of 05/12/2020  Medication Sig  . albuterol (PROVENTIL) (5 MG/ML) 0.5% nebulizer solution Take 0.5 mLs (2.5 mg total) by nebulization every 6 (six) hours as needed for wheezing or shortness of breath.  Marland Kitchen albuterol (VENTOLIN HFA) 108 (90 Base) MCG/ACT inhaler Inhale 2 puffs into the lungs every 6 (six) hours as needed for wheezing or shortness of breath.  . budesonide-formoterol (SYMBICORT) 80-4.5 MCG/ACT inhaler Inhale 2 puffs into the lungs 2 (two) times daily.  . cyclobenzaprine (FLEXERIL) 5 MG tablet Take 1 tablet (5 mg total) by mouth 3 (three) times daily as needed for muscle spasms.  Marland Kitchen levothyroxine (SYNTHROID) 100 MCG tablet Take 1 tablet (100 mcg total) by mouth daily.  . Multiple Vitamins-Iron (MULTIVITAMIN/IRON PO) Take by mouth.  . triamcinolone (KENALOG) 0.025 % cream Apply 1 application topically 2 (two) times daily.  Marland Kitchen voriconazole (VFEND) 200 MG tablet Take 1 tablet (200  mg total) by mouth 2 (two) times daily.   No facility-administered encounter medications on file as of 05/12/2020.    Surgical History: Past Surgical History:  Procedure Laterality Date  . COLONOSCOPY WITH PROPOFOL N/A 01/01/2016   Procedure: COLONOSCOPY WITH PROPOFOL;  Surgeon: Jonathon Bellows, MD;  Location: [facility] ENDOSCOPY;  Service: Endoscopy;  Laterality: N/A;  . ESOPHAGOGASTRODUODENOSCOPY (EGD) WITH PROPOFOL N/A 01/01/2016   Procedure: ESOPHAGOGASTRODUODENOSCOPY (EGD) WITH PROPOFOL;  Surgeon: Jonathon Bellows, MD;  Location: [facility] ENDOSCOPY;  Service: Endoscopy;  Laterality: N/A;  . THYROID SURGERY  1997   had thyroid removed  . TUBAL LIGATION  1995    Medical History: Past Medical History:  Diagnosis Date  . Anemia   . Hypothyroidism   . Thyroid disease     Family History: Family History  Problem Relation Age of Onset  . Colon cancer Father   . Hypertension Mother   . Diabetes Mother     Social History   Socioeconomic History  . Marital status: Divorced    Spouse name: Not on file  . Number of children: Not on file  . Years of education: Not on file  . Highest education level: Not on file  Occupational History  . Not on file  Tobacco Use  . Smoking status: Never Smoker  . Smokeless tobacco: Never Used  Substance and Sexual Activity  . Alcohol use: Yes    Comment: occasionally  . Drug use: No  . Sexual activity: Yes    Birth control/protection: None  Other Topics Concern  . Not on file  Social History Narrative  .  Not on file   Social Determinants of Health   Financial Resource Strain: Not on file  Food Insecurity: Not on file  Transportation Needs: Not on file  Physical Activity: Not on file  Stress: Not on file  Social Connections: Not on file  Intimate Partner Violence: Not on file      Review of Systems  Vital Signs: Resp 16   Ht 5\' 1"  (1.549 m)   Wt 178 lb (80.7 kg)   BMI 33.63 kg/m     Observation/Objective:     Assessment/Plan:   General Counseling: Beverly Campbell verbalizes understanding of the findings of today's phone visit and agrees with plan of treatment. I have discussed any further diagnostic evaluation that may be needed or ordered today. We also reviewed her medications today. she has been encouraged to call the office with any questions or concerns that should arise related to todays visit.    No orders of the defined types were placed in this encounter.   No orders of the defined types were placed in this encounter.    Time spent:*** Minutes   Tanna Furry. Runell Kovich AGNP-C Internal medicine

## 2020-05-15 ENCOUNTER — Ambulatory Visit: Payer: Managed Care, Other (non HMO) | Admitting: Physician Assistant

## 2020-05-15 ENCOUNTER — Encounter: Payer: Self-pay | Admitting: Physician Assistant

## 2020-05-15 DIAGNOSIS — J4520 Mild intermittent asthma, uncomplicated: Secondary | ICD-10-CM | POA: Diagnosis not present

## 2020-05-15 DIAGNOSIS — J0100 Acute maxillary sinusitis, unspecified: Secondary | ICD-10-CM | POA: Diagnosis not present

## 2020-05-15 DIAGNOSIS — J301 Allergic rhinitis due to pollen: Secondary | ICD-10-CM

## 2020-05-15 MED ORDER — AZITHROMYCIN 250 MG PO TABS: ORAL_TABLET | ORAL | 0 refills | Status: DC

## 2020-05-15 MED ORDER — BENZONATATE 100 MG PO CAPS: 100.0000 mg | ORAL_CAPSULE | Freq: Two times a day (BID) | ORAL | 0 refills | Status: DC | PRN

## 2020-05-15 NOTE — Progress Notes (Signed)
[facility] [place], [place] [address]  Internal MEDICINE  Telephone Visit  Patient Name: Beverly Campbell  [id number]  [id number]  Date of Service: 05/16/2020  I connected with the patient at 11:09 by telephone and verified the patients identity using two identifiers.   I discussed the limitations, risks, security and privacy concerns of performing an evaluation and management service by telephone and the availability of in person appointments. I also discussed with the patient that there may be a patient responsible charge related to the service.  The patient expressed understanding and agrees to proceed.    Chief Complaint  Patient presents with  . Telephone Assessment     Phone call [phone]  . Telephone Screen  . Acute Visit    Possible sinus infection, fever, coughing, headache     HPI  Pt is here for a sick visit. -Started last week when she went to [place] with high pollen count. Started coughing up mucus and developed a fever on Friday and lost her voice Friday as well. She is very hoarse on the phone today. Taking advil and mucinex. Does have postnasal drip and some congestion.  Denies any SOB or wheezing. Has not had to use any albuterol. She has just been taking her symbicort as prescribed for her asthma. She has not taken a home covid test and was encouraged to do so.  Current Medication: Outpatient Encounter Medications as of 05/15/2020  Medication Sig  . albuterol (PROVENTIL) (5 MG/ML) 0.5% nebulizer solution Take 0.5 mLs (2.5 mg total) by nebulization every 6 (six) hours as needed for wheezing or shortness of breath.  Marland Kitchen albuterol (VENTOLIN HFA) 108 (90 Base) MCG/ACT inhaler Inhale 2 puffs into the lungs every 6 (six) hours as needed for wheezing or shortness of breath.  Marland Kitchen azithromycin (ZITHROMAX) 250 MG tablet Take one tab a day for 14 days  . benzonatate (TESSALON) 100 MG capsule Take 1 capsule (100 mg total) by mouth 2 (two)  times daily as needed for cough.  . budesonide-formoterol (SYMBICORT) 80-4.5 MCG/ACT inhaler Inhale 2 puffs into the lungs 2 (two) times daily.  . cyclobenzaprine (FLEXERIL) 5 MG tablet Take 1 tablet (5 mg total) by mouth 3 (three) times daily as needed for muscle spasms.  Marland Kitchen levothyroxine (SYNTHROID) 100 MCG tablet Take 1 tablet (100 mcg total) by mouth daily.  . Multiple Vitamins-Iron (MULTIVITAMIN/IRON PO) Take by mouth.  . triamcinolone (KENALOG) 0.025 % cream Apply 1 application topically 2 (two) times daily.  Marland Kitchen voriconazole (VFEND) 200 MG tablet Take 1 tablet (200 mg total) by mouth 2 (two) times daily.   No facility-administered encounter medications on file as of 05/15/2020.    Surgical History: Past Surgical History:  Procedure Laterality Date  . COLONOSCOPY WITH PROPOFOL N/A 01/01/2016   Procedure: COLONOSCOPY WITH PROPOFOL;  Surgeon: Jonathon Bellows, MD;  Location: [facility] ENDOSCOPY;  Service: Endoscopy;  Laterality: N/A;  . ESOPHAGOGASTRODUODENOSCOPY (EGD) WITH PROPOFOL N/A 01/01/2016   Procedure: ESOPHAGOGASTRODUODENOSCOPY (EGD) WITH PROPOFOL;  Surgeon: Jonathon Bellows, MD;  Location: [facility] ENDOSCOPY;  Service: Endoscopy;  Laterality: N/A;  . THYROID SURGERY  1997   had thyroid removed  . TUBAL LIGATION  1995    Medical History: Past Medical History:  Diagnosis Date  . Anemia   . Hypothyroidism   . Thyroid disease     Family History: Family History  Problem Relation Age of Onset  . Colon cancer Father   . Hypertension Mother   . Diabetes Mother  Social History   Socioeconomic History  . Marital status: Divorced    Spouse name: Not on file  . Number of children: Not on file  . Years of education: Not on file  . Highest education level: Not on file  Occupational History  . Not on file  Tobacco Use  . Smoking status: Never Smoker  . Smokeless tobacco: Never Used  Substance and Sexual Activity  . Alcohol use: Yes    Comment: occasionally  . Drug use: No  . Sexual  activity: Yes    Birth control/protection: None  Other Topics Concern  . Not on file  Social History Narrative  . Not on file   Social Determinants of Health   Financial Resource Strain: Not on file  Food Insecurity: Not on file  Transportation Needs: Not on file  Physical Activity: Not on file  Stress: Not on file  Social Connections: Not on file  Intimate Partner Violence: Not on file      Review of Systems  Constitutional: Positive for fatigue and fever. Negative for chills.  HENT: Positive for congestion, postnasal drip, rhinorrhea, sinus pressure, sinus pain and sore throat. Negative for mouth sores.        Hoarse  Respiratory: Positive for cough. Negative for shortness of breath and wheezing.   Cardiovascular: Negative for chest pain.  Genitourinary: Negative for flank pain.  Musculoskeletal: Negative for myalgias.  Neurological: Positive for headaches.  Psychiatric/Behavioral: Negative.     Vital Signs: Resp 16   Ht 5\' 1"  (1.549 m)   Wt 178 lb (80.7 kg)   BMI 33.63 kg/m    Observation/Objective:   Pt is very hoarse on the phone, but able to carry out conversation.   Assessment/Plan: 1. Acute non-recurrent maxillary sinusitis Will starts on azithromycin and tessalon pearls prn, pt will call office if worsening and any wheezing or SOB develops for concern of infection in lungs given hx of asthma. Pt should continue mucinex and start flonase as well. Also encouraged to rest voice and try tea with honey, soup, and lozenges to soothe throat. - azithromycin (ZITHROMAX) 250 MG tablet; Take one tab a day for 14 days  Dispense: 14 tablet; Refill: 0 - benzonatate (TESSALON) 100 MG capsule; Take 1 capsule (100 mg total) by mouth 2 (two) times daily as needed for cough.  Dispense: 20 capsule; Refill: 0  2. Seasonal allergic rhinitis due to pollen Recommend antihistamine and flonase nasal spray  3. Mild intermittent asthma without complication Continue symbicort as  prescribed and albuterol as needed. Pt will call if worsening including any wheezing or SOB.    General Counseling: Beverly Campbell verbalizes understanding of the findings of today's phone visit and agrees with plan of treatment. I have discussed any further diagnostic evaluation that may be needed or ordered today. We also reviewed her medications today. she has been encouraged to call the office with any questions or concerns that should arise related to todays visit.    No orders of the defined types were placed in this encounter.   Meds ordered this encounter  Medications  . azithromycin (ZITHROMAX) 250 MG tablet    Sig: Take one tab a day for 14 days    Dispense:  14 tablet    Refill:  0  . benzonatate (TESSALON) 100 MG capsule    Sig: Take 1 capsule (100 mg total) by mouth 2 (two) times daily as needed for cough.    Dispense:  20 capsule    Refill:  0    Time spent:30 Minutes    Dr Lavera Guise Internal medicine

## 2020-05-16 ENCOUNTER — Ambulatory Visit: Payer: Managed Care, Other (non HMO)

## 2020-05-16 ENCOUNTER — Telehealth: Payer: Self-pay

## 2020-05-16 NOTE — Telephone Encounter (Signed)
-----   Message from Mylinda Latina, PA-C sent at 05/15/2020 12:39 PM EDT ----- Can you please call to check on how she is doing? Also ask about voriconizole

## 2020-05-23 ENCOUNTER — Ambulatory Visit
Admission: RE | Admit: 2020-05-23 | Discharge: 2020-05-23 | Disposition: A | Discharge status: Home / Self Care | Payer: Managed Care, Other (non HMO) | Source: Ambulatory Visit | Attending: Hospice and Palliative Medicine | Admitting: Hospice and Palliative Medicine

## 2020-05-23 ENCOUNTER — Other Ambulatory Visit: Payer: Self-pay

## 2020-05-23 DIAGNOSIS — Z1231 Encounter for screening mammogram for malignant neoplasm of breast: Secondary | ICD-10-CM | POA: Insufficient documentation

## 2020-05-25 NOTE — Progress Notes (Signed)
Normal mammogram

## 2020-06-06 ENCOUNTER — Ambulatory Visit: Payer: Managed Care, Other (non HMO) | Admitting: Internal Medicine

## 2020-06-12 ENCOUNTER — Other Ambulatory Visit: Payer: Self-pay

## 2020-06-12 ENCOUNTER — Ambulatory Visit: Payer: Managed Care, Other (non HMO) | Admitting: Internal Medicine

## 2020-06-12 ENCOUNTER — Encounter: Payer: Self-pay | Admitting: Internal Medicine

## 2020-06-12 VITALS — BP 120/88 | HR 88 | Temp 98.4°F | Resp 16 | Ht 61.0 in | Wt 178.8 lb

## 2020-06-12 DIAGNOSIS — J4520 Mild intermittent asthma, uncomplicated: Secondary | ICD-10-CM | POA: Diagnosis not present

## 2020-06-12 DIAGNOSIS — J301 Allergic rhinitis due to pollen: Secondary | ICD-10-CM | POA: Diagnosis not present

## 2020-06-12 NOTE — Progress Notes (Signed)
[facility] [place], [place] [address]  Pulmonary Sleep Medicine   Office Visit Note  Patient Name: Beverly Campbell DOB: 1965/11/10 MRN [id number]  Date of Service: 06/12/2020  Complaints/HPI: Allergies follow up she is actually been doing fairly well with medical management of her allergies.  She has not had any increasing cough congestion other than her baseline.  She does on occasion get some wheezing.  This is relieved just by routine medications that she takes.  Denies having any headaches no dizziness.  She recently had a pulmonary function study which I did review  ROS  General: (-) fever, (-) chills, (-) night sweats, (-) weakness Skin: (-) rashes, (-) itching,. Eyes: (-) visual changes, (-) redness, (-) itching. Nose and Sinuses: (-) nasal stuffiness or itchiness, (-) postnasal drip, (-) nosebleeds, (-) sinus trouble. Mouth and Throat: (-) sore throat, (-) hoarseness. Neck: (-) swollen glands, (-) enlarged thyroid, (-) neck pain. Respiratory: + cough, (-) bloody sputum, - shortness of breath, + wheezing. Cardiovascular: - ankle swelling, (-) chest pain. Lymphatic: (-) lymph node enlargement. Neurologic: (-) numbness, (-) tingling. Psychiatric: (-) anxiety, (-) depression   Current Medication: Outpatient Encounter Medications as of 06/12/2020  Medication Sig   albuterol (PROVENTIL) (5 MG/ML) 0.5% nebulizer solution Take 0.5 mLs (2.5 mg total) by nebulization every 6 (six) hours as needed for wheezing or shortness of breath.   albuterol (VENTOLIN HFA) 108 (90 Base) MCG/ACT inhaler Inhale 2 puffs into the lungs every 6 (six) hours as needed for wheezing or shortness of breath.   budesonide-formoterol (SYMBICORT) 80-4.5 MCG/ACT inhaler Inhale 2 puffs into the lungs 2 (two) times daily.   cyclobenzaprine (FLEXERIL) 5 MG tablet Take 1 tablet (5 mg total) by mouth 3 (three) times daily as needed for muscle spasms.   levothyroxine (SYNTHROID)  100 MCG tablet Take 1 tablet (100 mcg total) by mouth daily.   Multiple Vitamins-Iron (MULTIVITAMIN/IRON PO) Take by mouth.   triamcinolone (KENALOG) 0.025 % cream Apply 1 application topically 2 (two) times daily.   voriconazole (VFEND) 200 MG tablet Take 1 tablet (200 mg total) by mouth 2 (two) times daily.   [DISCONTINUED] azithromycin (ZITHROMAX) 250 MG tablet Take one tab a day for 14 days (Patient not taking: Reported on 06/12/2020)   [DISCONTINUED] benzonatate (TESSALON) 100 MG capsule Take 1 capsule (100 mg total) by mouth 2 (two) times daily as needed for cough. (Patient not taking: Reported on 06/12/2020)   No facility-administered encounter medications on file as of 06/12/2020.    Surgical History: Past Surgical History:  Procedure Laterality Date   COLONOSCOPY WITH PROPOFOL N/A 01/01/2016   Procedure: COLONOSCOPY WITH PROPOFOL;  Surgeon: Jonathon Bellows, MD;  Location: [facility] ENDOSCOPY;  Service: Endoscopy;  Laterality: N/A;   ESOPHAGOGASTRODUODENOSCOPY (EGD) WITH PROPOFOL N/A 01/01/2016   Procedure: ESOPHAGOGASTRODUODENOSCOPY (EGD) WITH PROPOFOL;  Surgeon: Jonathon Bellows, MD;  Location: [facility] ENDOSCOPY;  Service: Endoscopy;  Laterality: N/A;   THYROID SURGERY  1997   had thyroid removed   TUBAL LIGATION  1995    Medical History: Past Medical History:  Diagnosis Date   Anemia    Hypothyroidism    Thyroid disease     Family History: Family History  Problem Relation Age of Onset   Colon cancer Father    Hypertension Mother    Diabetes Mother    Breast cancer Neg Hx     Social History: Social History   Socioeconomic History   Marital status: Divorced    Spouse name: Not on  file   Number of children: Not on file   Years of education: Not on file   Highest education level: Not on file  Occupational History   Not on file  Tobacco Use   Smoking status: Never Smoker   Smokeless tobacco: Never Used  Substance and Sexual Activity   Alcohol use: Yes    Comment: occasionally    Drug use: No   Sexual activity: Yes    Birth control/protection: None  Other Topics Concern   Not on file  Social History Narrative   Not on file   Social Determinants of Health   Financial Resource Strain: Not on file  Food Insecurity: Not on file  Transportation Needs: Not on file  Physical Activity: Not on file  Stress: Not on file  Social Connections: Not on file  Intimate Partner Violence: Not on file    Vital Signs: Blood pressure 120/88, pulse 88, temperature 98.4 F (36.9 C), resp. rate 16, height $RemoveBe'5\' 1"'sOflotmFX$  (1.549 m), weight 178 lb 12.8 oz (81.1 kg), last menstrual period 04/20/2019, SpO2 95 %, unknown if currently breastfeeding.  Examination: General Appearance: The patient is well-developed, well-nourished, and in no distress. Skin: Gross inspection of skin unremarkable. Head: normocephalic, no gross deformities. Eyes: no gross deformities noted. ENT: ears appear grossly normal no exudates. Neck: Supple. No thyromegaly. No LAD. Respiratory: few rhonchi noted. Cardiovascular: Normal S1 and S2 without murmur or rub. Extremities: No cyanosis. pulses are equal. Neurologic: Alert and oriented. No involuntary movements.  LABS: Recent Results (from the past 2160 hour(s))  Comprehensive metabolic panel     Status: Abnormal   Collection Time: 04/06/20  2:55 PM  Result Value Ref Range   Glucose 100 (H) 65 - 99 mg/dL   BUN 14 6 - 24 mg/dL   Creatinine, Ser 0.90 0.57 - 1.00 mg/dL   eGFR 76 >59 mL/min/1.73   BUN/Creatinine Ratio 16 9 - 23   Sodium 142 134 - 144 mmol/L   Potassium 3.8 3.5 - 5.2 mmol/L   Chloride 103 96 - 106 mmol/L   CO2 21 20 - 29 mmol/L   Calcium 9.3 8.7 - 10.2 mg/dL   Total Protein 7.0 6.0 - 8.5 g/dL   Albumin 4.3 3.8 - 4.9 g/dL   Globulin, Total 2.7 1.5 - 4.5 g/dL   Albumin/Globulin Ratio 1.6 1.2 - 2.2   Bilirubin Total 1.0 0.0 - 1.2 mg/dL   Alkaline Phosphatase 75 44 - 121 IU/L   AST 19 0 - 40 IU/L   ALT 17 0 - 32 IU/L  Lipid Panel w/o  Chol/HDL Ratio     Status: None   Collection Time: 04/06/20  2:55 PM  Result Value Ref Range   Cholesterol, Total 185 100 - 199 mg/dL   Triglycerides 118 0 - 149 mg/dL   HDL 67 >39 mg/dL   VLDL Cholesterol Cal 21 5 - 40 mg/dL   LDL Chol Calc (NIH) 97 0 - 99 mg/dL  FSH/LH     Status: None   Collection Time: 04/06/20  2:55 PM  Result Value Ref Range   LH 34.9 mIU/mL    Comment:                     Adult Female:                       Follicular phase      2.4 -  12.6  Ovulation phase      14.0 -  95.6                       Luteal phase          1.0 -  11.4                       Postmenopausal        7.7 -  58.5    FSH 83.8 mIU/mL    Comment:                     Adult Female:                       Follicular phase      3.5 -  12.5                       Ovulation phase       4.7 -  21.5                       Luteal phase          1.7 -   7.7                       Postmenopausal       25.8 - 134.8   T4, free     Status: None   Collection Time: 04/06/20  2:55 PM  Result Value Ref Range   Free T4 1.56 0.82 - 1.77 ng/dL  TSH     Status: None   Collection Time: 04/06/20  2:55 PM  Result Value Ref Range   TSH 1.430 0.450 - 4.500 uIU/mL  VITAMIN D 25 Hydroxy (Vit-D Deficiency, Fractures)     Status: Abnormal   Collection Time: 04/06/20  2:55 PM  Result Value Ref Range   Vit D, 25-Hydroxy 24.7 (L) 30.0 - 100.0 ng/mL    Comment: Vitamin D deficiency has been defined by the [place] and an Endocrine Society practice guideline as a level of serum 25-OH vitamin D less than 20 ng/mL (1,2). The Endocrine Society went on to further define vitamin D insufficiency as a level between 21 and 29 ng/mL (2). 1. IOM (Institute of Medicine). 2010. Dietary reference    intakes for calcium and D. [place]: The    [employer]. 2. Holick MF, Binkley , Bischoff-Ferrari HA, et al.    Evaluation, treatment, and prevention of vitamin D    deficiency: an  Endocrine Society clinical practice    guideline. JCEM. 2011 Jul; 96(7):1911-30.   UA/M w/rflx Culture, Routine     Status: None   Collection Time: 05/02/20  3:44 PM   Specimen: Urine   Urine  Result Value Ref Range   Specific Gravity, UA 1.021 1.005 - 1.030   pH, UA 5.5 5.0 - 7.5   Color, UA Yellow Yellow   Appearance Ur Clear Clear   Leukocytes,UA Negative Negative   Protein,UA Negative Negative/Trace   Glucose, UA Negative Negative   Ketones, UA Negative Negative   RBC, UA Negative Negative   Bilirubin, UA Negative Negative   Urobilinogen, Ur 0.2 0.2 - 1.0 mg/dL   Nitrite, UA Negative Negative   Microscopic Examination Comment     Comment: Microscopic follows if indicated.   Microscopic Examination See below:  Comment: Microscopic was indicated and was performed.   Urinalysis Reflex Comment     Comment: This specimen will not reflex to a Urine Culture.  Microscopic Examination     Status: None   Collection Time: 05/02/20  3:44 PM   Urine  Result Value Ref Range   WBC, UA None seen 0 - 5 /hpf   RBC 0-2 0 - 2 /hpf   Epithelial Cells (non renal) None seen 0 - 10 /hpf   Casts None seen None seen /lpf   Bacteria, UA None seen None seen/Few  IGP, Aptima HPV     Status: None   Collection Time: 05/02/20  3:49 PM  Result Value Ref Range   Interpretation NILM     Comment: NEGATIVE FOR INTRAEPITHELIAL LESION OR MALIGNANCY.   Category NIL     Comment: Negative for Intraepithelial Lesion   Adequacy SECNI     Comment: Satisfactory for evaluation. No endocervical component is identified.   Clinician Provided ICD10 Comment     Comment: Z12.4   Performed by: Comment     Comment: Shanon Brow, Cytotechnologist (ASCP)   Note: Comment     Comment: The Pap smear is a screening test designed to aid in the detection of premalignant and malignant conditions of the uterine cervix.  It is not a diagnostic procedure and should not be used as the sole means of detecting cervical  cancer.  Both false-positive and false-negative reports do occur.    Test Methodology Comment     Comment: This liquid based ThinPrep(R) pap test was screened with the use of an image guided system.    HPV Aptima Negative Negative    Comment: This nucleic acid amplification test detects fourteen high-risk HPV types (16,18,31,33,35,39,45,51,52,56,58,59,66,68) without differentiation.   NuSwab Vaginitis Plus (VG+)     Status: None   Collection Time: 05/02/20  3:49 PM  Result Value Ref Range   Atopobium vaginae Low - 0 Score   BVAB 2 Low - 0 Score   Megasphaera 1 Low - 0 Score    Comment: Calculate total score by adding the 3 individual bacterial vaginosis (BV) marker scores together.  Total score is interpreted as follows: Total score 0-1: Indicates the absence of BV. Total score   2: Indeterminate for BV. Additional clinical                  data should be evaluated to establish a                  diagnosis. Total score 3-6: Indicates the presence of BV. This test was developed and its performance characteristics determined by [facility].  It has not been cleared or approved by the Food and Drug Administration.    Candida albicans, NAA Negative Negative   Candida glabrata, NAA Negative Negative   Trich vag by NAA Negative Negative   Chlamydia trachomatis, NAA Negative Negative   Neisseria gonorrhoeae, NAA Negative Negative    Radiology: MM 3D SCREEN BREAST BILATERAL  Result Date: 05/23/2020 CLINICAL DATA:  Screening. EXAM: DIGITAL SCREENING BILATERAL MAMMOGRAM WITH TOMOSYNTHESIS AND CAD TECHNIQUE: Bilateral screening digital craniocaudal and mediolateral oblique mammograms were obtained. Bilateral screening digital breast tomosynthesis was performed. The images were evaluated with computer-aided detection. COMPARISON:  Previous exam(s). ACR Breast Density Category c: The breast tissue is heterogeneously dense, which may obscure small masses. FINDINGS: There are no findings  suspicious for malignancy. The images were evaluated with computer-aided detection. IMPRESSION: No mammographic evidence of malignancy. A  result letter of this screening mammogram will be mailed directly to the patient. RECOMMENDATION: Screening mammogram in one year. (Code:SM-B-01Y) BI-RADS CATEGORY  1: Negative. Electronically Signed   By: Ammie Ferrier M.D.   On: 05/23/2020 16:14    No results found.  MM 3D SCREEN BREAST BILATERAL  Result Date: 05/23/2020 CLINICAL DATA:  Screening. EXAM: DIGITAL SCREENING BILATERAL MAMMOGRAM WITH TOMOSYNTHESIS AND CAD TECHNIQUE: Bilateral screening digital craniocaudal and mediolateral oblique mammograms were obtained. Bilateral screening digital breast tomosynthesis was performed. The images were evaluated with computer-aided detection. COMPARISON:  Previous exam(s). ACR Breast Density Category c: The breast tissue is heterogeneously dense, which may obscure small masses. FINDINGS: There are no findings suspicious for malignancy. The images were evaluated with computer-aided detection. IMPRESSION: No mammographic evidence of malignancy. A result letter of this screening mammogram will be mailed directly to the patient. RECOMMENDATION: Screening mammogram in one year. (Code:SM-B-01Y) BI-RADS CATEGORY  1: Negative. Electronically Signed   By: Ammie Ferrier M.D.   On: 05/23/2020 16:14      Assessment and Plan: Patient Active Problem List   Diagnosis Date Noted   Mild intermittent asthma without complication 40/34/7425   Atopic dermatitis 12/04/2019   Body mass index (BMI) of 33.0-33.9 in adult 12/04/2019   Encounter for general adult medical examination with abnormal findings 05/23/2019   Shortness of breath 05/23/2019   Routine cervical smear 05/23/2019   Other fatigue 05/07/2018   Wheezing 05/07/2018   Respiratory infection 04/27/2018   Cough 04/27/2018   Screening for breast cancer 10/19/2017   Ganglion cyst of finger 10/19/2017   Acquired  hypothyroidism 10/19/2017   Vitamin D deficiency 10/19/2017   Dysuria 10/19/2017   Internal hemorrhoids    Diverticulosis of large intestine without diverticulitis    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 Mild intermittent asthma without complication She states overall doing a little better. She notes that she has been on symbicort and this has helped her significantly.   2. Seasonal allergic rhinitis due to pollen She has been as needed on the allergy meds for pollen. She states tghat she does use claritin  3. Obesity, morbid ([place]) Obesity Counseling: Had a lengthy discussion regarding patients BMI and weight issues. Patient was instructed on portion control as well as increased activity. Also discussed caloric restrictions with trying to maintain intake less than 2000 Kcal. Discussions were made in accordance with the 5As of weight management. Simple actions such as not eating late and if able to, taking a walk is suggested.  4. 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 Her IgE was negative though. 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 I will  Repeat titers of aspergillous as well as IgE levels  Hold off on voriconazole right now   General Counseling: I have discussed the findings of the evaluation and examination with Mamta.  I have also discussed any further diagnostic evaluation thatmay be needed or ordered today. Averiana verbalizes understanding of the findings of todays visit. We also reviewed her medications today and discussed drug interactions and side effects including but not limited excessive drowsiness and altered mental states. We also discussed that there is always a risk not just to her but also people around her. she  has been encouraged  to call the office with any questions or concerns that should arise related to todays visit.  Orders Placed This Encounter  Procedures   Aspergillus IgE Panel   Allergen, A fumigatus IgG   Pulmonary function test    Standing Status:   Future    Standing Expiration Date:   06/12/2021    Order Specific Question:   Where should this test be performed?    Answer:   [facility]     Time spent: 78  I have personally obtained a history, examined the patient, evaluated laboratory and imaging results, formulated the assessment and plan and placed orders.    Allyne Gee, MD [facility] Pulmonary and Critical Care Sleep medicine

## 2020-06-12 NOTE — Patient Instructions (Signed)
Asthma, Adult  Asthma is a long-term (chronic) condition in which the airways get tight and narrow. The airways are the breathing passages that lead from the nose and mouth down into the lungs. A person with asthma will have times when symptoms get worse. These are called asthma attacks. They can cause coughing, whistling sounds when you breathe (wheezing), shortness of breath, and chest pain. They can make it hard to breathe. There is no cure for asthma, but medicines and lifestyle changes can help control it. There are many things that can bring on an asthma attack or make asthma symptoms worse (triggers). Common triggers include:  Mold.  Dust.  Cigarette smoke.  Cockroaches.  Things that can cause allergy symptoms (allergens). These include animal skin flakes (dander) and pollen from trees or grass.  Things that pollute the air. These may include household cleaners, wood smoke, smog, or chemical odors.  Cold air, weather changes, and wind.  Crying or laughing hard.  Stress.  Certain medicines or drugs.  Certain foods such as dried fruit, potato chips, and grape juice.  Infections, such as a cold or the flu.  Certain medical conditions or diseases.  Exercise or tiring activities. Asthma may be treated with medicines and by staying away from the things that cause asthma attacks. Types of medicines may include:  Controller medicines. These help prevent asthma symptoms. They are usually taken every day.  Fast-acting reliever or rescue medicines. These quickly relieve asthma symptoms. They are used as needed and provide short-term relief.  Allergy medicines if your attacks are brought on by allergens.  Medicines to help control the body's defense (immune) system. Follow these instructions at home: Avoiding triggers in your home  Change your heating and air conditioning filter often.  Limit your use of fireplaces and wood stoves.  Get rid of pests (such as roaches and  mice) and their droppings.  Throw away plants if you see mold on them.  Clean your floors. Dust regularly. Use cleaning products that do not smell.  Have someone vacuum when you are not home. Use a vacuum cleaner with a HEPA filter if possible.  Replace carpet with wood, tile, or vinyl flooring. Carpet can trap animal skin flakes and dust.  Use allergy-proof pillows, mattress covers, and box spring covers.  Wash bed sheets and blankets every week in hot water. Dry them in a dryer.  Keep your bedroom free of any triggers.  Avoid pets and keep windows closed when things that cause allergy symptoms are in the air.  Use blankets that are made of polyester or cotton.  Clean bathrooms and kitchens with bleach. If possible, have someone repaint the walls in these rooms with mold-resistant paint. Keep out of the rooms that are being cleaned and painted.  Wash your hands often with soap and water. If soap and water are not available, use hand sanitizer.  Do not allow anyone to smoke in your home. General instructions  Take over-the-counter and prescription medicines only as told by your doctor. ? Talk with your doctor if you have questions about how or when to take your medicines. ? Make note if you need to use your medicines more often than usual.  Do not use any products that contain nicotine or tobacco, such as cigarettes and e-cigarettes. If you need help quitting, ask your doctor.  Stay away from secondhand smoke.  Avoid doing things outdoors when allergen counts are high and when air quality is low.  Wear a ski mask   when doing outdoor activities in the winter. The mask should cover your nose and mouth. Exercise indoors on cold days if you can.  Warm up before you exercise. Take time to cool down after exercise.  Use a peak flow meter as told by your doctor. A peak flow meter is a tool that measures how well the lungs are working.  Keep track of the peak flow meter's readings.  Write them down.  Follow your asthma action plan. This is a written plan for taking care of your asthma and treating your attacks.  Make sure you get all the shots (vaccines) that your doctor recommends. Ask your doctor about a flu shot and a pneumonia shot.  Keep all follow-up visits as told by your doctor. This is important. Contact a doctor if:  You have wheezing, shortness of breath, or a cough even while taking medicine to prevent attacks.  The mucus you cough up (sputum) is thicker than usual.  The mucus you cough up changes from clear or white to yellow, green, gray, or bloody.  You have problems from the medicine you are taking, such as: ? A rash. ? Itching. ? Swelling. ? Trouble breathing.  You need reliever medicines more than 2-3 times a week.  Your peak flow reading is still at 50-79% of your personal best after following the action plan for 1 hour.  You have a fever. Get help right away if:  You seem to be worse and are not responding to medicine during an asthma attack.  You are short of breath even at rest.  You get short of breath when doing very little activity.  You have trouble eating, drinking, or talking.  You have chest pain or tightness.  You have a fast heartbeat.  Your lips or fingernails start to turn blue.  You are light-headed or dizzy, or you faint.  Your peak flow is less than 50% of your personal best.  You feel too tired to breathe normally. Summary  Asthma is a long-term (chronic) condition in which the airways get tight and narrow. An asthma attack can make it hard to breathe.  Asthma cannot be cured, but medicines and lifestyle changes can help control it.  Make sure you understand how to avoid triggers and how and when to use your medicines. This information is not intended to replace advice given to you by your health care provider. Make sure you discuss any questions you have with your health care provider. Document Revised:  05/12/2019 Document Reviewed: 05/12/2019 Elsevier Patient Education  2021 [employer]vier Inc.   

## 2020-06-20 ENCOUNTER — Other Ambulatory Visit: Payer: Self-pay | Admitting: Nurse Practitioner

## 2020-06-25 NOTE — Procedures (Signed)
[facility] [address] [place], [address]    Complete Pulmonary Function Testing Interpretation:  FINDINGS:  Forced vital capacity is normal FEV1 is normal FEV1 FVC ratio is within normal limits.  Postbronchodilator no significant change in the FEV1 clinical improvement may still occur in the absence of spirometric improvement.  Total lung capacity is normal residual volume is decreased residual in total capacity ratio is decreased FRC is decreased.  DLCO is normal.  DLCO is normal.  IMPRESSION:  This pulmonary function study is essentially within normal limits  Allyne Gee, MD [facility] Pulmonary Critical Care Medicine Sleep Medicine

## 2020-07-04 ENCOUNTER — Ambulatory Visit: Payer: Managed Care, Other (non HMO) | Admitting: Internal Medicine

## 2020-07-14 ENCOUNTER — Telehealth: Payer: Self-pay

## 2020-07-14 NOTE — Telephone Encounter (Signed)
Left vm to screen for 07/17/20 appointment-Toni 

## 2020-07-17 ENCOUNTER — Other Ambulatory Visit: Payer: Self-pay

## 2020-07-17 ENCOUNTER — Encounter: Payer: Self-pay | Admitting: Internal Medicine

## 2020-07-17 ENCOUNTER — Other Ambulatory Visit: Payer: Self-pay | Admitting: Internal Medicine

## 2020-07-17 ENCOUNTER — Ambulatory Visit (INDEPENDENT_AMBULATORY_CARE_PROVIDER_SITE_OTHER): Payer: Managed Care, Other (non HMO) | Admitting: Internal Medicine

## 2020-07-17 VITALS — BP 146/80 | HR 80 | Temp 97.4°F | Resp 16 | Ht 61.0 in | Wt 176.0 lb

## 2020-07-17 DIAGNOSIS — J4520 Mild intermittent asthma, uncomplicated: Secondary | ICD-10-CM

## 2020-07-17 DIAGNOSIS — R062 Wheezing: Secondary | ICD-10-CM

## 2020-07-17 DIAGNOSIS — J301 Allergic rhinitis due to pollen: Secondary | ICD-10-CM

## 2020-07-17 MED ORDER — ALBUTEROL SULFATE HFA 108 (90 BASE) MCG/ACT IN AERS: 2.0000 | INHALATION_SPRAY | Freq: Four times a day (QID) | RESPIRATORY_TRACT | 3 refills | Status: DC | PRN

## 2020-07-17 MED ORDER — ALBUTEROL SULFATE (5 MG/ML) 0.5% IN NEBU: 2.5000 mg | INHALATION_SOLUTION | Freq: Four times a day (QID) | RESPIRATORY_TRACT | 12 refills | Status: DC | PRN

## 2020-07-17 NOTE — Progress Notes (Signed)
[facility] [place], [place] [address]  Pulmonary Sleep Medicine   Office Visit Note  Patient Name: Beverly Campbell DOB: September 20, 1965 MRN [id number]  Date of Service: 07/17/2020  Complaints/HPI: asthma under good control. She feels that her breathing has been improving. She has been more active. She goes for a walk in the mornings and her breathing is able to tolerate with no difficulty. She continues to use the symbicort and albuterol  ROS  General: (-) fever, (-) chills, (-) night sweats, (-) weakness Skin: (-) rashes, (-) itching,. Eyes: (-) visual changes, (-) redness, (-) itching. Nose and Sinuses: (-) nasal stuffiness or itchiness, (-) postnasal drip, (-) nosebleeds, (-) sinus trouble. Mouth and Throat: (-) sore throat, (-) hoarseness. Neck: (-) swollen glands, (-) enlarged thyroid, (-) neck pain. Respiratory: - cough, (-) bloody sputum, - shortness of breath, - wheezing. Cardiovascular: - ankle swelling, (-) chest pain. Lymphatic: (-) lymph node enlargement. Neurologic: (-) numbness, (-) tingling. Psychiatric: (-) anxiety, (-) depression   Current Medication: Outpatient Encounter Medications as of 07/17/2020  Medication Sig   albuterol (PROVENTIL) (5 MG/ML) 0.5% nebulizer solution Take 0.5 mLs (2.5 mg total) by nebulization every 6 (six) hours as needed for wheezing or shortness of breath.   albuterol (VENTOLIN HFA) 108 (90 Base) MCG/ACT inhaler Inhale 2 puffs into the lungs every 6 (six) hours as needed for wheezing or shortness of breath.   budesonide-formoterol (SYMBICORT) 80-4.5 MCG/ACT inhaler Inhale 2 puffs into the lungs 2 (two) times daily.   cyclobenzaprine (FLEXERIL) 5 MG tablet Take 1 tablet (5 mg total) by mouth 3 (three) times daily as needed for muscle spasms.   levothyroxine (SYNTHROID) 100 MCG tablet Take 1 tablet (100 mcg total) by mouth daily.   Multiple Vitamins-Iron (MULTIVITAMIN/IRON PO) Take by mouth.    triamcinolone (KENALOG) 0.025 % cream Apply 1 application topically 2 (two) times daily.   [DISCONTINUED] voriconazole (VFEND) 200 MG tablet Take 1 tablet (200 mg total) by mouth 2 (two) times daily. (Patient not taking: Reported on 07/17/2020)   No facility-administered encounter medications on file as of 07/17/2020.    Surgical History: Past Surgical History:  Procedure Laterality Date   COLONOSCOPY WITH PROPOFOL N/A 01/01/2016   Procedure: COLONOSCOPY WITH PROPOFOL;  Surgeon: Jonathon Bellows, MD;  Location: [facility] ENDOSCOPY;  Service: Endoscopy;  Laterality: N/A;   ESOPHAGOGASTRODUODENOSCOPY (EGD) WITH PROPOFOL N/A 01/01/2016   Procedure: ESOPHAGOGASTRODUODENOSCOPY (EGD) WITH PROPOFOL;  Surgeon: Jonathon Bellows, MD;  Location: [facility] ENDOSCOPY;  Service: Endoscopy;  Laterality: N/A;   THYROID SURGERY  1997   had thyroid removed   TUBAL LIGATION  1995    Medical History: Past Medical History:  Diagnosis Date   Anemia    Hypothyroidism    Thyroid disease     Family History: Family History  Problem Relation Age of Onset   Colon cancer Father    Hypertension Mother    Diabetes Mother    Breast cancer Neg Hx     Social History: Social History   Socioeconomic History   Marital status: Divorced    Spouse name: Not on file   Number of children: Not on file   Years of education: Not on file   Highest education level: Not on file  Occupational History   Not on file  Tobacco Use   Smoking status: Never   Smokeless tobacco: Never  Substance and Sexual Activity   Alcohol use: Yes    Comment: occasionally   Drug use: No   Sexual  activity: Yes    Birth control/protection: None  Other Topics Concern   Not on file  Social History Narrative   Not on file   Social Determinants of Health   Financial Resource Strain: Not on file  Food Insecurity: Not on file  Transportation Needs: Not on file  Physical Activity: Not on file  Stress: Not on file  Social Connections: Not on file   Intimate Partner Violence: Not on file    Vital Signs: Blood pressure (!) 146/80, pulse 80, temperature (!) 97.4 F (36.3 C), resp. rate 16, height 5\' 1"  (1.549 m), weight 176 lb (79.8 kg), last menstrual period 04/20/2019, SpO2 97 %, unknown if currently breastfeeding.  Examination: General Appearance: The patient is well-developed, well-nourished, and in no distress. Skin: Gross inspection of skin unremarkable. Head: normocephalic, no gross deformities. Eyes: no gross deformities noted. ENT: ears appear grossly normal no exudates. Neck: Supple. No thyromegaly. No LAD. Respiratory: no rhonchi noted. Cardiovascular: Normal S1 and S2 without murmur or rub. Extremities: No cyanosis. pulses are equal. Neurologic: Alert and oriented. No involuntary movements.  LABS: Recent Results (from the past 2160 hour(s))  UA/M w/rflx Culture, Routine     Status: None   Collection Time: 05/02/20  3:44 PM   Specimen: Urine   Urine  Result Value Ref Range   Specific Gravity, UA 1.021 1.005 - 1.030   pH, UA 5.5 5.0 - 7.5   Color, UA Yellow Yellow   Appearance Ur Clear Clear   Leukocytes,UA Negative Negative   Protein,UA Negative Negative/Trace   Glucose, UA Negative Negative   Ketones, UA Negative Negative   RBC, UA Negative Negative   Bilirubin, UA Negative Negative   Urobilinogen, Ur 0.2 0.2 - 1.0 mg/dL   Nitrite, UA Negative Negative   Microscopic Examination Comment     Comment: Microscopic follows if indicated.   Microscopic Examination See below:     Comment: Microscopic was indicated and was performed.   Urinalysis Reflex Comment     Comment: This specimen will not reflex to a Urine Culture.  Microscopic Examination     Status: None   Collection Time: 05/02/20  3:44 PM   Urine  Result Value Ref Range   WBC, UA None seen 0 - 5 /hpf   RBC 0-2 0 - 2 /hpf   Epithelial Cells (non renal) None seen 0 - 10 /hpf   Casts None seen None seen /lpf   Bacteria, UA None seen None seen/Few   IGP, Aptima HPV     Status: None   Collection Time: 05/02/20  3:49 PM  Result Value Ref Range   Interpretation NILM     Comment: NEGATIVE FOR INTRAEPITHELIAL LESION OR MALIGNANCY.   Category NIL     Comment: Negative for Intraepithelial Lesion   Adequacy SECNI     Comment: Satisfactory for evaluation. No endocervical component is identified.   Clinician Provided ICD10 Comment     Comment: Z12.4   Performed by: Comment     Comment: Beverly Campbell, Cytotechnologist (ASCP)   Note: Comment     Comment: The Pap smear is a screening test designed to aid in the detection of premalignant and malignant conditions of the uterine cervix.  It is not a diagnostic procedure and should not be used as the sole means of detecting cervical cancer.  Both false-positive and false-negative reports do occur.    Test Methodology Comment     Comment: This liquid based ThinPrep(R) pap test was screened with the  use of an image guided system.    HPV Aptima Negative Negative    Comment: This nucleic acid amplification test detects fourteen high-risk HPV types (16,18,31,33,35,39,45,51,52,56,58,59,66,68) without differentiation.   NuSwab Vaginitis Plus (VG+)     Status: None   Collection Time: 05/02/20  3:49 PM  Result Value Ref Range   Atopobium vaginae Low - 0 Score   BVAB 2 Low - 0 Score   Megasphaera 1 Low - 0 Score    Comment: Calculate total score by adding the 3 individual bacterial vaginosis (BV) marker scores together.  Total score is interpreted as follows: Total score 0-1: Indicates the absence of BV. Total score   2: Indeterminate for BV. Additional clinical                  data should be evaluated to establish a                  diagnosis. Total score 3-6: Indicates the presence of BV. This test was developed and its performance characteristics determined by [facility].  It has not been cleared or approved by the Food and Drug Administration.    Candida albicans, NAA Negative Negative    Candida glabrata, NAA Negative Negative   Trich vag by NAA Negative Negative   Chlamydia trachomatis, NAA Negative Negative   Neisseria gonorrhoeae, NAA Negative Negative  Aspergillus IgE Panel     Status: None (Preliminary result)   Collection Time: 07/13/20  4:22 PM  Result Value Ref Range   Aspergillus fumigatus IgE Comment kU/L    Comment: We have received your specimen and it has been forwarded to another laboratory for testing. Results will be forwarded to you as soon as possible.    A. Fumigatus Class Interp WILL FOLLOW    Aspergillus [place] IgE WILL FOLLOW    A. [place] Class Interp WILL FOLLOW    Aspergillus versicolor IgE WILL FOLLOW    A. Versicolor Class Interp WILL FOLLOW    Aspergillus amstel/glaucu IgE* WILL FOLLOW    A. Amstel/Glaucu Class Interp WILL FOLLOW    Aspergillus flavus IgE WILL FOLLOW    A. Flavus Class Interp WILL FOLLOW    Aspergillus nidulans IgE WILL FOLLOW    A. Nidulans Class Interp WILL FOLLOW   Allergen, A fumigatus IgG     Status: Abnormal   Collection Time: 07/13/20  4:22 PM  Result Value Ref Range   Aspergillus fumigatus IgG 43.8 (H) 0.0 - 1.9 ug/mL    Radiology: MM 3D SCREEN BREAST BILATERAL  Result Date: 05/23/2020 CLINICAL DATA:  Screening. EXAM: DIGITAL SCREENING BILATERAL MAMMOGRAM WITH TOMOSYNTHESIS AND CAD TECHNIQUE: Bilateral screening digital craniocaudal and mediolateral oblique mammograms were obtained. Bilateral screening digital breast tomosynthesis was performed. The images were evaluated with computer-aided detection. COMPARISON:  Previous exam(s). ACR Breast Density Category c: The breast tissue is heterogeneously dense, which may obscure small masses. FINDINGS: There are no findings suspicious for malignancy. The images were evaluated with computer-aided detection. IMPRESSION: No mammographic evidence of malignancy. A result letter of this screening mammogram will be mailed directly to the patient. RECOMMENDATION: Screening  mammogram in one year. (Code:SM-B-01Y) BI-RADS CATEGORY  1: Negative. Electronically Signed   By: Beverly Campbell M.D.   On: 05/23/2020 16:14    No results found.  No results found.    Assessment and Plan: Patient Active Problem List   Diagnosis Date Noted   Mild intermittent asthma without complication 35/57/3220   Atopic dermatitis 12/04/2019   Body  mass index (BMI) of 33.0-33.9 in adult 12/04/2019   Encounter for general adult medical examination with abnormal findings 05/23/2019   Shortness of breath 05/23/2019   Routine cervical smear 05/23/2019   Other fatigue 05/07/2018   Wheezing 05/07/2018   Respiratory infection 04/27/2018   Cough 04/27/2018   Screening for breast cancer 10/19/2017   Ganglion cyst of finger 10/19/2017   Acquired hypothyroidism 10/19/2017   Vitamin D deficiency 10/19/2017   Dysuria 10/19/2017   Internal hemorrhoids    Diverticulosis of large intestine without diverticulitis    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 Mild intermittent asthma without complication Under control will continue with present management. She had a repeat aspergillus IgG which was elevated to fumigatus. Others are pending. I dont think this is ABPA or invasive aspergillus  2. Seasonal allergic rhinitis due to pollen Antihistamines to be used as ordered  3. Obesity, morbid ([place]) Obesity Counseling: Risk Assessment: An assessment of behavioral risk factors was made today and includes lack of exercise sedentary lifestyle, lack of portion control and poor dietary habits.  Risk Modification Advice: She was counseled on portion control guidelines. Restricting daily caloric intake to 1500. The detrimental long term effects of obesity on her health and ongoing poor compliance was also  discussed with the patient.     General Counseling: I have discussed the findings of the evaluation and examination with Logen.  I have also discussed any further diagnostic evaluation thatmay be needed or ordered today. Cali verbalizes understanding of the findings of todays visit. We also reviewed her medications today and discussed drug interactions and side effects including but not limited excessive drowsiness and altered mental states. We also discussed that there is always a risk not just to her but also people around her. she has been encouraged to call the office with any questions or concerns that should arise related to todays visit.  No orders of the defined types were placed in this encounter.    Time spent: 32  I have personally obtained a history, examined the patient, evaluated laboratory and imaging results, formulated the assessment and plan and placed orders.    Allyne Gee, MD [facility] Pulmonary and Critical Care Sleep medicine

## 2020-07-17 NOTE — Patient Instructions (Signed)
Asthma, Adult  Asthma is a long-term (chronic) condition in which the airways get tight and narrow. The airways are the breathing passages that lead from the nose and mouth down into the lungs. A person with asthma will have times when symptoms get worse. These are called asthma attacks. They can cause coughing, whistling sounds when you breathe (wheezing), shortness of breath, and chest pain. They can make it hard to breathe. Thereis no cure for asthma, but medicines and lifestyle changes can help control it. There are many things that can bring on an asthma attack or make asthma symptoms worse (triggers). Common triggers include: Mold. Dust. Cigarette smoke. Cockroaches. Things that can cause allergy symptoms (allergens). These include animal skin flakes (dander) and pollen from trees or grass. Things that pollute the air. These may include household cleaners, wood smoke, smog, or chemical odors. Cold air, weather changes, and wind. Crying or laughing hard. Stress. Certain medicines or drugs. Certain foods such as dried fruit, potato chips, and grape juice. Infections, such as a cold or the flu. Certain medical conditions or diseases. Exercise or tiring activities. Asthma may be treated with medicines and by staying away from the things that cause asthma attacks. Types of medicines may include: Controller medicines. These help prevent asthma symptoms. They are usually taken every day. Fast-acting reliever or rescue medicines. These quickly relieve asthma symptoms. They are used as needed and provide short-term relief. Allergy medicines if your attacks are brought on by allergens. Medicines to help control the body's defense (immune) system. Follow these instructions at home: Avoiding triggers in your home Change your heating and air conditioning filter often. Limit your use of fireplaces and wood stoves. Get rid of pests (such as roaches and mice) and their droppings. Throw away plants  if you see mold on them. Clean your floors. Dust regularly. Use cleaning products that do not smell. Have someone vacuum when you are not home. Use a vacuum cleaner with a HEPA filter if possible. Replace carpet with wood, tile, or vinyl flooring. Carpet can trap animal skin flakes and dust. Use allergy-proof pillows, mattress covers, and box spring covers. Wash bed sheets and blankets every week in hot water. Dry them in a dryer. Keep your bedroom free of any triggers. Avoid pets and keep windows closed when things that cause allergy symptoms are in the air. Use blankets that are made of polyester or cotton. Clean bathrooms and kitchens with bleach. If possible, have someone repaint the walls in these rooms with mold-resistant paint. Keep out of the rooms that are being cleaned and painted. Wash your hands often with soap and water. If soap and water are not available, use hand sanitizer. Do not allow anyone to smoke in your home. General instructions Take over-the-counter and prescription medicines only as told by your doctor. Talk with your doctor if you have questions about how or when to take your medicines. Make note if you need to use your medicines more often than usual. Do not use any products that contain nicotine or tobacco, such as cigarettes and e-cigarettes. If you need help quitting, ask your doctor. Stay away from secondhand smoke. Avoid doing things outdoors when allergen counts are high and when air quality is low. Wear a ski mask when doing outdoor activities in the winter. The mask should cover your nose and mouth. Exercise indoors on cold days if you can. Warm up before you exercise. Take time to cool down after exercise. Use a peak flow meter as  told by your doctor. A peak flow meter is a tool that measures how well the lungs are working. Keep track of the peak flow meter's readings. Write them down. Follow your asthma action plan. This is a written plan for taking care  of your asthma and treating your attacks. Make sure you get all the shots (vaccines) that your doctor recommends. Ask your doctor about a flu shot and a pneumonia shot. Keep all follow-up visits as told by your doctor. This is important. Contact a doctor if: You have wheezing, shortness of breath, or a cough even while taking medicine to prevent attacks. The mucus you cough up (sputum) is thicker than usual. The mucus you cough up changes from clear or white to yellow, green, gray, or bloody. You have problems from the medicine you are taking, such as: A rash. Itching. Swelling. Trouble breathing. You need reliever medicines more than 2-3 times a week. Your peak flow reading is still at 50-79% of your personal best after following the action plan for 1 hour. You have a fever. Get help right away if: You seem to be worse and are not responding to medicine during an asthma attack. You are short of breath even at rest. You get short of breath when doing very little activity. You have trouble eating, drinking, or talking. You have chest pain or tightness. You have a fast heartbeat. Your lips or fingernails start to turn blue. You are light-headed or dizzy, or you faint. Your peak flow is less than 50% of your personal best. You feel too tired to breathe normally. Summary Asthma is a long-term (chronic) condition in which the airways get tight and narrow. An asthma attack can make it hard to breathe. Asthma cannot be cured, but medicines and lifestyle changes can help control it. Make sure you understand how to avoid triggers and how and when to use your medicines. This information is not intended to replace advice given to you by your health care provider. Make sure you discuss any questions you have with your healthcare provider. Document Revised: 05/12/2019 Document Reviewed: 05/12/2019 Elsevier Patient Education  2022 [employer].

## 2020-07-18 ENCOUNTER — Other Ambulatory Visit: Payer: Self-pay

## 2020-07-18 MED ORDER — ALBUTEROL SULFATE (2.5 MG/3ML) 0.083% IN NEBU: 2.5000 mg | INHALATION_SOLUTION | Freq: Four times a day (QID) | RESPIRATORY_TRACT | 12 refills | Status: DC | PRN

## 2020-07-20 LAB — ASPERGILLUS IGE PANEL
A. Amstel/Glaucu Class Interp: 0
A. Flavus Class Interp: 0
A. Fumigatus Class Interp: 0
A. Nidulans Class Interp: 0
A. Niger Class Interp: 0
A. Versicolor Class Interp: 0
Aspergillus amstel/glaucu IgE*: <0.35 kU/L (ref <0.35)
Aspergillus flavus IgE: <0.35 kU/L (ref <0.35)
Aspergillus fumigatus IgE: <0.1 kU/L (ref <0.35)
Aspergillus nidulans IgE: <0.35 kU/L (ref <0.35)
Aspergillus niger IgE: <0.1 kU/L (ref <0.35)
Aspergillus versicolor IgE: <0.1 kU/L (ref <0.35)

## 2020-07-20 LAB — ALLERGEN A FUMIGATUS IGG: Aspergillus fumigatus IgG: 43.8 ug/mL — ABNORMAL HIGH (ref 0.0–1.9)

## 2020-07-21 LAB — PULMONARY FUNCTION TEST

## 2020-07-31 ENCOUNTER — Telehealth: Payer: Self-pay

## 2020-07-31 NOTE — Telephone Encounter (Signed)
Left vm to screen for 08/01/20 appointment-Toni 

## 2020-08-01 ENCOUNTER — Ambulatory Visit: Payer: Managed Care, Other (non HMO) | Admitting: Nurse Practitioner

## 2020-10-25 ENCOUNTER — Other Ambulatory Visit: Payer: Self-pay

## 2020-10-25 MED ORDER — LEVOTHYROXINE SODIUM 100 MCG PO TABS: 100.0000 ug | ORAL_TABLET | Freq: Every day | ORAL | 1 refills | Status: DC

## 2021-01-30 ENCOUNTER — Encounter: Payer: Self-pay | Admitting: Internal Medicine

## 2021-01-30 ENCOUNTER — Telehealth: Payer: Self-pay

## 2021-01-30 ENCOUNTER — Ambulatory Visit: Payer: Managed Care, Other (non HMO) | Admitting: Internal Medicine

## 2021-01-30 ENCOUNTER — Other Ambulatory Visit: Payer: Self-pay

## 2021-01-30 VITALS — BP 130/92 | HR 70 | Temp 98.0°F | Resp 16 | Ht 61.0 in | Wt 180.4 lb

## 2021-01-30 DIAGNOSIS — J301 Allergic rhinitis due to pollen: Secondary | ICD-10-CM | POA: Diagnosis not present

## 2021-01-30 DIAGNOSIS — R911 Solitary pulmonary nodule: Secondary | ICD-10-CM

## 2021-01-30 DIAGNOSIS — R0602 Shortness of breath: Secondary | ICD-10-CM | POA: Diagnosis not present

## 2021-01-30 DIAGNOSIS — J4520 Mild intermittent asthma, uncomplicated: Secondary | ICD-10-CM | POA: Diagnosis not present

## 2021-01-30 NOTE — Telephone Encounter (Signed)
CT chest ordered. Printed. Gave to Tat-Toni

## 2021-01-30 NOTE — Progress Notes (Signed)
[facility], [place] [address]  Pulmonary Sleep Medicine   Office Visit Note  Patient Name: Beverly Campbell DOB: 12/10/1965 MRN [id number]  Date of Service: 01/30/2021  Complaints/HPI: Asthma seems to be doing well. She has not required her rescue as frequently as she was in the past.  She denies having any wheezing or cough or congestion.  Denies having any chest pain she still does have some shortness of breath when she exerts herself.  Patient has not had any admissions to the hospital either  ROS  General: (-) fever, (-) chills, (-) night sweats, (-) weakness Skin: (-) rashes, (-) itching,. Eyes: (-) visual changes, (-) redness, (-) itching. Nose and Sinuses: (-) nasal stuffiness or itchiness, (-) postnasal drip, (-) nosebleeds, (-) sinus trouble. Mouth and Throat: (-) sore throat, (-) hoarseness. Neck: (-) swollen glands, (-) enlarged thyroid, (-) neck pain. Respiratory: - cough, (-) bloody sputum, + shortness of breath, - wheezing. Cardiovascular: - ankle swelling, (-) chest pain. Lymphatic: (-) lymph node enlargement. Neurologic: (-) numbness, (-) tingling. Psychiatric: (-) anxiety, (-) depression   Current Medication: Outpatient Encounter Medications as of 01/30/2021  Medication Sig   albuterol (PROVENTIL) (2.5 MG/3ML) 0.083% nebulizer solution Take 3 mLs (2.5 mg total) by nebulization every 6 (six) hours as needed for wheezing or shortness of breath.   albuterol (PROVENTIL) (5 MG/ML) 0.5% nebulizer solution Take 0.5 mLs (2.5 mg total) by nebulization every 6 (six) hours as needed for wheezing or shortness of breath.   albuterol (VENTOLIN HFA) 108 (90 Base) MCG/ACT inhaler Inhale 2 puffs into the lungs every 6 (six) hours as needed for wheezing or shortness of breath.   budesonide-formoterol (SYMBICORT) 80-4.5 MCG/ACT inhaler Inhale 2 puffs into the lungs 2 (two) times daily.   cyclobenzaprine (FLEXERIL) 5 MG tablet Take 1  tablet (5 mg total) by mouth 3 (three) times daily as needed for muscle spasms.   levothyroxine (SYNTHROID) 100 MCG tablet Take 1 tablet (100 mcg total) by mouth daily.   Multiple Vitamins-Iron (MULTIVITAMIN/IRON PO) Take by mouth.   triamcinolone (KENALOG) 0.025 % cream Apply 1 application topically 2 (two) times daily.   No facility-administered encounter medications on file as of 01/30/2021.    Surgical History: Past Surgical History:  Procedure Laterality Date   COLONOSCOPY WITH PROPOFOL N/A 01/01/2016   Procedure: COLONOSCOPY WITH PROPOFOL;  Surgeon: Jonathon Bellows, MD;  Location: [facility] ENDOSCOPY;  Service: Endoscopy;  Laterality: N/A;   ESOPHAGOGASTRODUODENOSCOPY (EGD) WITH PROPOFOL N/A 01/01/2016   Procedure: ESOPHAGOGASTRODUODENOSCOPY (EGD) WITH PROPOFOL;  Surgeon: Jonathon Bellows, MD;  Location: [facility] ENDOSCOPY;  Service: Endoscopy;  Laterality: N/A;   THYROID SURGERY  1997   had thyroid removed   TUBAL LIGATION  1995    Medical History: Past Medical History:  Diagnosis Date   Anemia    Asthma    Hypothyroidism    Thyroid disease     Family History: Family History  Problem Relation Age of Onset   Colon cancer Father    Hypertension Mother    Diabetes Mother    Breast cancer Neg Hx     Social History: Social History   Socioeconomic History   Marital status: Divorced    Spouse name: Not on file   Number of children: Not on file   Years of education: Not on file   Highest education level: Not on file  Occupational History   Not on file  Tobacco Use   Smoking status: Never   Smokeless tobacco: Never  Substance and Sexual Activity   Alcohol use: Yes    Comment: occasionally   Drug use: No   Sexual activity: Yes    Birth control/protection: None  Other Topics Concern   Not on file  Social History Narrative   Not on file   Social Determinants of Health   Financial Resource Strain: Not on file  Food Insecurity: Not on file  Transportation Needs: Not on file   Physical Activity: Not on file  Stress: Not on file  Social Connections: Not on file  Intimate Partner Violence: Not on file    Vital Signs: Blood pressure (!) 130/92, pulse 70, temperature 98 F (36.7 C), resp. rate 16, height 5\' 1"  (1.549 m), weight 180 lb 6.4 oz (81.8 kg), last menstrual period 04/20/2019, SpO2 98 %, unknown if currently breastfeeding.  Examination: General Appearance: The patient is well-developed, well-nourished, and in no distress. Skin: Gross inspection of skin unremarkable. Head: normocephalic, no gross deformities. Eyes: no gross deformities noted. ENT: ears appear grossly normal no exudates. Neck: Supple. No thyromegaly. No LAD. Respiratory: no rhonchi noted. Cardiovascular: Normal S1 and S2 without murmur or rub. Extremities: No cyanosis. pulses are equal. Neurologic: Alert and oriented. No involuntary movements.  LABS: No results found for this or any previous visit (from the past 2160 hour(s)).  Radiology: MM 3D SCREEN BREAST BILATERAL  Result Date: 05/23/2020 CLINICAL DATA:  Screening. EXAM: DIGITAL SCREENING BILATERAL MAMMOGRAM WITH TOMOSYNTHESIS AND CAD TECHNIQUE: Bilateral screening digital craniocaudal and mediolateral oblique mammograms were obtained. Bilateral screening digital breast tomosynthesis was performed. The images were evaluated with computer-aided detection. COMPARISON:  Previous exam(s). ACR Breast Density Category c: The breast tissue is heterogeneously dense, which may obscure small masses. FINDINGS: There are no findings suspicious for malignancy. The images were evaluated with computer-aided detection. IMPRESSION: No mammographic evidence of malignancy. A result letter of this screening mammogram will be mailed directly to the patient. RECOMMENDATION: Screening mammogram in one year. (Code:SM-B-01Y) BI-RADS CATEGORY  1: Negative. Electronically Signed   By: Ammie Ferrier M.D.   On: 05/23/2020 16:14    No results found.  No  results found.    Assessment and Plan: Patient Active Problem List   Diagnosis Date Noted   Mild intermittent asthma without complication 16/10/9602   Atopic dermatitis 12/04/2019   Body mass index (BMI) of 33.0-33.9 in adult 12/04/2019   Encounter for general adult medical examination with abnormal findings 05/23/2019   Shortness of breath 05/23/2019   Routine cervical smear 05/23/2019   Other fatigue 05/07/2018   Wheezing 05/07/2018   Respiratory infection 04/27/2018   Cough 04/27/2018   Screening for breast cancer 10/19/2017   Ganglion cyst of finger 10/19/2017   Acquired hypothyroidism 10/19/2017   Vitamin D deficiency 10/19/2017   Dysuria 10/19/2017   Internal hemorrhoids    Diverticulosis of large intestine without diverticulitis    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 Shortness of breath At her baseline overall though she does appear to be doing better. - Spirometry with Graph  2. Mild intermittent asthma without complication Reviewed her inhaler regimen  3. Seasonal allergic rhinitis due to pollen Doing fine continue antihistamine therapy as tolerated.  4. Obesity, morbid ([place]) Obesity Counseling: Had a lengthy discussion regarding patients BMI and weight issues. Patient was instructed on portion control  as well as increased activity. Also discussed caloric restrictions with trying to maintain intake less than 2000 Kcal. Discussions were made in accordance with the 5As of weight management. Simple actions such as not eating late and if able to, taking a walk is suggested.   5. Right middle lobe pulmonary nodule She will need a follow-up scan done we will go ahead and order - CT Chest High Resolution; Future   General Counseling: I have discussed the findings of the  evaluation and examination with Dewana.  I have also discussed any further diagnostic evaluation thatmay be needed or ordered today. Shannan verbalizes understanding of the findings of todays visit. We also reviewed her medications today and discussed drug interactions and side effects including but not limited excessive drowsiness and altered mental states. We also discussed that there is always a risk not just to her but also people around her. she has been encouraged to call the office with any questions or concerns that should arise related to todays visit.  Orders Placed This Encounter  Procedures   Spirometry with Graph    Order Specific Question:   Where should this test be performed?    Answer:   [facility]    Order Specific Question:   Basic spirometry    Answer:   Yes     Time spent: 64  I have personally obtained a history, examined the patient, evaluated laboratory and imaging results, formulated the assessment and plan and placed orders.    Allyne Gee, MD [facility] Pulmonary and Critical Care Sleep medicine

## 2021-01-30 NOTE — Patient Instructions (Signed)
Asthma, Adult ?Asthma is a long-term (chronic) condition in which the airways get tight and narrow. The airways are the breathing passages that lead from the nose and mouth down into the lungs. A person with asthma will have times when symptoms get worse. These are called asthma attacks. They can cause coughing, whistling sounds when you breathe (wheezing), shortness of breath, and chest pain. They can make it hard to breathe. There is no cure for asthma, but medicines and lifestyle changes can help control it. ?There are many things that can bring on an asthma attack or make asthma symptoms worse (triggers). Common triggers include: ?Mold. ?Dust. ?Cigarette smoke. ?Cockroaches. ?Things that can cause allergy symptoms (allergens). These include animal skin flakes (dander) and pollen from trees or grass. ?Things that pollute the air. These may include household cleaners, wood smoke, smog, or chemical odors. ?Cold air, weather changes, and wind. ?Crying or laughing hard. ?Stress. ?Certain medicines or drugs. ?Certain foods such as dried fruit, potato chips, and grape juice. ?Infections, such as a cold or the flu. ?Certain medical conditions or diseases. ?Exercise or tiring activities. ?Asthma may be treated with medicines and by staying away from the things that cause asthma attacks. Types of medicines may include: ?Controller medicines. These help prevent asthma symptoms. They are usually taken every day. ?Fast-acting reliever or rescue medicines. These quickly relieve asthma symptoms. They are used as needed and provide short-term relief. ?Allergy medicines if your attacks are brought on by allergens. ?Medicines to help control the body's defense (immune) system. ?Follow these instructions at home: ?Avoiding triggers in your home ?Change your heating and air conditioning filter often. ?Limit your use of fireplaces and wood stoves. ?Get rid of pests (such as roaches and mice) and their droppings. ?Throw away plants  if you see mold on them. ?Clean your floors. Dust regularly. Use cleaning products that do not smell. ?Have someone vacuum when you are not home. Use a vacuum cleaner with a HEPA filter if possible. ?Replace carpet with wood, tile, or vinyl flooring. Carpet can trap animal skin flakes and dust. ?Use allergy-proof pillows, mattress covers, and box spring covers. ?Wash bed sheets and blankets every week in hot water. Dry them in a dryer. ?Keep your bedroom free of any triggers. ?Avoid pets and keep windows closed when things that cause allergy symptoms are in the air. ?Use blankets that are made of polyester or cotton. ?Clean bathrooms and kitchens with bleach. If possible, have someone repaint the walls in these rooms with mold-resistant paint. Keep out of the rooms that are being cleaned and painted. ?Wash your hands often with soap and water. If soap and water are not available, use hand sanitizer. ?Do not allow anyone to smoke in your home. ?General instructions ?Take over-the-counter and prescription medicines only as told by your doctor. ?Talk with your doctor if you have questions about how or when to take your medicines. ?Make note if you need to use your medicines more often than usual. ?Do not use any products that contain nicotine or tobacco, such as cigarettes and e-cigarettes. If you need help quitting, ask your doctor. ?Stay away from secondhand smoke. ?Avoid doing things outdoors when allergen counts are high and when air quality is low. ?Wear a ski mask when doing outdoor activities in the winter. The mask should cover your nose and mouth. Exercise indoors on cold days if you can. ?Warm up before you exercise. Take time to cool down after exercise. ?Use a peak flow meter as   told by your doctor. A peak flow meter is a tool that measures how well the lungs are working. ?Keep track of the peak flow meter's readings. Write them down. ?Follow your asthma action plan. This is a written plan for taking care  of your asthma and treating your attacks. ?Make sure you get all the shots (vaccines) that your doctor recommends. Ask your doctor about a flu shot and a pneumonia shot. ?Keep all follow-up visits as told by your doctor. This is important. ?Contact a doctor if: ?You have wheezing, shortness of breath, or a cough even while taking medicine to prevent attacks. ?The mucus you cough up (sputum) is thicker than usual. ?The mucus you cough up changes from clear or white to yellow, green, gray, or bloody. ?You have problems from the medicine you are taking, such as: ?A rash. ?Itching. ?Swelling. ?Trouble breathing. ?You need reliever medicines more than 2-3 times a week. ?Your peak flow reading is still at 50-79% of your personal best after following the action plan for 1 hour. ?You have a fever. ?Get help right away if: ?You seem to be worse and are not responding to medicine during an asthma attack. ?You are short of breath even at rest. ?You get short of breath when doing very little activity. ?You have trouble eating, drinking, or talking. ?You have chest pain or tightness. ?You have a fast heartbeat. ?Your lips or fingernails start to turn blue. ?You are light-headed or dizzy, or you faint. ?Your peak flow is less than 50% of your personal best. ?You feel too tired to breathe normally. ?Summary ?Asthma is a long-term (chronic) condition in which the airways get tight and narrow. An asthma attack can make it hard to breathe. ?Asthma cannot be cured, but medicines and lifestyle changes can help control it. ?Make sure you understand how to avoid triggers and how and when to use your medicines. ?This information is not intended to replace advice given to you by your health care provider. Make sure you discuss any questions you have with your health care provider. ?Document Revised: 05/02/2019 Document Reviewed: 05/12/2019 ?Elsevier Patient Education ? 2022 Elsevier Inc. ? ?

## 2021-01-31 ENCOUNTER — Telehealth: Payer: Self-pay

## 2021-01-31 NOTE — Telephone Encounter (Signed)
Patient scheduled for ct chest on 02/20/21 @ 11:00 kp/tat

## 2021-02-07 ENCOUNTER — Telehealth: Payer: Self-pay

## 2021-02-07 NOTE — Telephone Encounter (Signed)
Patient has rescheduled appt for ct chest to 02/12/21 @ 8:45 [email or web address]

## 2021-02-20 ENCOUNTER — Ambulatory Visit: Payer: Managed Care, Other (non HMO)

## 2021-02-27 ENCOUNTER — Encounter: Payer: Self-pay | Admitting: Internal Medicine

## 2021-02-27 ENCOUNTER — Ambulatory Visit: Payer: Managed Care, Other (non HMO) | Admitting: Internal Medicine

## 2021-02-27 ENCOUNTER — Other Ambulatory Visit: Payer: Self-pay

## 2021-02-27 VITALS — BP 110/96 | HR 81 | Temp 98.3°F | Resp 16 | Ht 61.0 in | Wt 181.0 lb

## 2021-02-27 DIAGNOSIS — R0602 Shortness of breath: Secondary | ICD-10-CM

## 2021-02-27 DIAGNOSIS — J301 Allergic rhinitis due to pollen: Secondary | ICD-10-CM

## 2021-02-27 DIAGNOSIS — J4520 Mild intermittent asthma, uncomplicated: Secondary | ICD-10-CM | POA: Diagnosis not present

## 2021-02-27 DIAGNOSIS — K7689 Other specified diseases of liver: Secondary | ICD-10-CM

## 2021-02-27 MED ORDER — BUDESONIDE-FORMOTEROL FUMARATE 80-4.5 MCG/ACT IN AERO: 2.0000 | INHALATION_SPRAY | Freq: Two times a day (BID) | RESPIRATORY_TRACT | 3 refills | Status: DC

## 2021-02-27 MED ORDER — BENZONATATE 100 MG PO CAPS: 100.0000 mg | ORAL_CAPSULE | Freq: Two times a day (BID) | ORAL | 0 refills | Status: DC | PRN

## 2021-02-27 NOTE — Progress Notes (Signed)
[facility] [place], [place] [address]  Pulmonary Sleep Medicine   Office Visit Note  Patient Name: Beverly Campbell DOB: [birth year] MRN [id number]  Date of Service: 02/27/2021  Complaints/HPI: Follow up of pulmonary nodule. She had a CT chest done results are pending. She states her asthma has been under good control. She has been using her symbicort and this does seem to maintain her breathing.  She denies having any recent exacerbations no admissions to the hospital  ROS  General: (-) fever, (-) chills, (-) night sweats, (-) weakness Skin: (-) rashes, (-) itching,. Eyes: (-) visual changes, (-) redness, (-) itching. Nose and Sinuses: (-) nasal stuffiness or itchiness, (-) postnasal drip, (-) nosebleeds, (-) sinus trouble. Mouth and Throat: (-) sore throat, (-) hoarseness. Neck: (-) swollen glands, (-) enlarged thyroid, (-) neck pain. Respiratory: + cough, (-) bloody sputum, + shortness of breath, - wheezing. Cardiovascular: - ankle swelling, (-) chest pain. Lymphatic: (-) lymph node enlargement. Neurologic: (-) numbness, (-) tingling. Psychiatric: (-) anxiety, (-) depression   Current Medication: Outpatient Encounter Medications as of 02/27/2021  Medication Sig   albuterol (PROVENTIL) (2.5 MG/3ML) 0.083% nebulizer solution Take 3 mLs (2.5 mg total) by nebulization every 6 (six) hours as needed for wheezing or shortness of breath.   albuterol (PROVENTIL) (5 MG/ML) 0.5% nebulizer solution Take 0.5 mLs (2.5 mg total) by nebulization every 6 (six) hours as needed for wheezing or shortness of breath.   albuterol (VENTOLIN HFA) 108 (90 Base) MCG/ACT inhaler Inhale 2 puffs into the lungs every 6 (six) hours as needed for wheezing or shortness of breath.   budesonide-formoterol (SYMBICORT) 80-4.5 MCG/ACT inhaler Inhale 2 puffs into the lungs 2 (two) times daily.   cyclobenzaprine (FLEXERIL) 5 MG tablet Take 1 tablet (5 mg total) by mouth 3 (three)  times daily as needed for muscle spasms.   levothyroxine (SYNTHROID) 100 MCG tablet Take 1 tablet (100 mcg total) by mouth daily.   Multiple Vitamins-Iron (MULTIVITAMIN/IRON PO) Take by mouth.   triamcinolone (KENALOG) 0.025 % cream Apply 1 application topically 2 (two) times daily.   No facility-administered encounter medications on file as of 02/27/2021.    Surgical History: Past Surgical History:  Procedure Laterality Date   COLONOSCOPY WITH PROPOFOL N/A 01/01/2016   Procedure: COLONOSCOPY WITH PROPOFOL;  Surgeon: Jonathon Bellows, MD;  Location: [facility] ENDOSCOPY;  Service: Endoscopy;  Laterality: N/A;   ESOPHAGOGASTRODUODENOSCOPY (EGD) WITH PROPOFOL N/A 01/01/2016   Procedure: ESOPHAGOGASTRODUODENOSCOPY (EGD) WITH PROPOFOL;  Surgeon: Jonathon Bellows, MD;  Location: [facility] ENDOSCOPY;  Service: Endoscopy;  Laterality: N/A;   THYROID SURGERY  1997   had thyroid removed   TUBAL LIGATION  1995    Medical History: Past Medical History:  Diagnosis Date   Anemia    Asthma    Hypothyroidism    Thyroid disease     Family History: Family History  Problem Relation Age of Onset   Colon cancer Father    Hypertension Mother    Diabetes Mother    Breast cancer Neg Hx     Social History: Social History   Socioeconomic History   Marital status: Divorced    Spouse name: Not on file   Number of children: Not on file   Years of education: Not on file   Highest education level: Not on file  Occupational History   Not on file  Tobacco Use   Smoking status: Never   Smokeless tobacco: Never  Substance and Sexual Activity   Alcohol use: Yes  Comment: occasionally   Drug use: No   Sexual activity: Yes    Birth control/protection: None  Other Topics Concern   Not on file  Social History Narrative   Not on file   Social Determinants of Health   Financial Resource Strain: Not on file  Food Insecurity: Not on file  Transportation Needs: Not on file  Physical Activity: Not on file  Stress:  Not on file  Social Connections: Not on file  Intimate Partner Violence: Not on file    Vital Signs: Blood pressure (!) 110/96, pulse 81, temperature 98.3 F (36.8 C), resp. rate 16, height 5\' 1"  (1.549 m), weight 181 lb (82.1 kg), last menstrual period 04/20/2019, SpO2 97 %, unknown if currently breastfeeding.  Examination: General Appearance: The patient is well-developed, well-nourished, and in no distress. Skin: Gross inspection of skin unremarkable. Head: normocephalic, no gross deformities. Eyes: no gross deformities noted. ENT: ears appear grossly normal no exudates. Neck: Supple. No thyromegaly. No LAD. Respiratory: no rhonchi noted. Cardiovascular: Normal S1 and S2 without murmur or rub. Extremities: No cyanosis. pulses are equal. Neurologic: Alert and oriented. No involuntary movements.  LABS: No results found for this or any previous visit (from the past 2160 hour(s)).  Radiology: MM 3D SCREEN BREAST BILATERAL  Result Date: 05/23/2020 CLINICAL DATA:  Screening. EXAM: DIGITAL SCREENING BILATERAL MAMMOGRAM WITH TOMOSYNTHESIS AND CAD TECHNIQUE: Bilateral screening digital craniocaudal and mediolateral oblique mammograms were obtained. Bilateral screening digital breast tomosynthesis was performed. The images were evaluated with computer-aided detection. COMPARISON:  Previous exam(s). ACR Breast Density Category c: The breast tissue is heterogeneously dense, which may obscure small masses. FINDINGS: There are no findings suspicious for malignancy. The images were evaluated with computer-aided detection. IMPRESSION: No mammographic evidence of malignancy. A result letter of this screening mammogram will be mailed directly to the patient. RECOMMENDATION: Screening mammogram in one year. (Code:SM-B-01Y) BI-RADS CATEGORY  1: Negative. Electronically Signed   By: Ammie Ferrier M.D.   On: 05/23/2020 16:14    No results found.  No results found.    Assessment and Plan: Patient  Active Problem List   Diagnosis Date Noted   Mild intermittent asthma without complication 28/31/5176   Atopic dermatitis 12/04/2019   Body mass index (BMI) of 33.0-33.9 in adult 12/04/2019   Encounter for general adult medical examination with abnormal findings 05/23/2019   Shortness of breath 05/23/2019   Routine cervical smear 05/23/2019   Other fatigue 05/07/2018   Wheezing 05/07/2018   Respiratory infection 04/27/2018   Cough 04/27/2018   Screening for breast cancer 10/19/2017   Ganglion cyst of finger 10/19/2017   Acquired hypothyroidism 10/19/2017   Vitamin D deficiency 10/19/2017   Dysuria 10/19/2017   Internal hemorrhoids    Diverticulosis of large intestine without diverticulitis    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 Shortness of breath At her baseline level multifactorial probably related to underlying history of asthma as well as obesity.  2. Mild intermittent asthma without complication  - budesonide-formoterol (SYMBICORT) 80-4.5 MCG/ACT inhaler; Inhale 2 puffs into the lungs 2 (two) times daily.  Dispense: 1 each; Refill: 3 - benzonatate (TESSALON) 100 MG capsule; Take 1 capsule (100 mg total) by mouth 2 (two) times daily as needed for cough.  Dispense: 20 capsule; Refill: 0  3. Seasonal allergic rhinitis  due to pollen Antihistamines as necessary  4. Obesity, morbid ([place]) Obesity Counseling: Had a lengthy discussion regarding patients BMI and weight issues. Patient was instructed on portion control as well as increased activity. Also discussed caloric restrictions with trying to maintain intake less than 2000 Kcal. Discussions were made in accordance with the 5As of weight management. Simple actions such as not eating late and if able to, taking a walk is  suggested.   5. Liver cyst This was noted on recent imaging evaluation suggest doing an ultrasound of the liver - US Abdomen Limited RUQ (LIVER/GB); Future    General Counseling: I have discussed the findings of the evaluation and examination with Ellamarie.  I have also discussed any further diagnostic evaluation thatmay be needed or ordered today. Alisabeth verbalizes understanding of the findings of todays visit. We also reviewed her medications today and discussed drug interactions and side effects including but not limited excessive drowsiness and altered mental states. We also discussed that there is always a risk not just to her but also people around her. she has been encouraged to call the office with any questions or concerns that should arise related to todays visit.  Orders Placed This Encounter  Procedures   US Abdomen Limited RUQ (LIVER/GB)    Standing Status:   Future    Standing Expiration Date:   02/27/2022    Order Specific Question:   Reason for Exam (SYMPTOM  OR DIAGNOSIS REQUIRED)    Answer:   ;iver cyst    Order Specific Question:   Preferred imaging location?    Answer:   External     Time spent: 52  I have personally obtained a history, examined the patient, evaluated laboratory and imaging results, formulated the assessment and plan and placed orders.    Allyne Gee, MD [facility] Pulmonary and Critical Care Sleep medicine

## 2021-02-27 NOTE — Patient Instructions (Signed)
Asthma, Adult ?Asthma is a long-term (chronic) condition in which the airways get tight and narrow. The airways are the breathing passages that lead from the nose and mouth down into the lungs. A person with asthma will have times when symptoms get worse. These are called asthma attacks. They can cause coughing, whistling sounds when you breathe (wheezing), shortness of breath, and chest pain. They can make it hard to breathe. There is no cure for asthma, but medicines and lifestyle changes can help control it. ?There are many things that can bring on an asthma attack or make asthma symptoms worse (triggers). Common triggers include: ?Mold. ?Dust. ?Cigarette smoke. ?Cockroaches. ?Things that can cause allergy symptoms (allergens). These include animal skin flakes (dander) and pollen from trees or grass. ?Things that pollute the air. These may include household cleaners, wood smoke, smog, or chemical odors. ?Cold air, weather changes, and wind. ?Crying or laughing hard. ?Stress. ?Certain medicines or drugs. ?Certain foods such as dried fruit, potato chips, and grape juice. ?Infections, such as a cold or the flu. ?Certain medical conditions or diseases. ?Exercise or tiring activities. ?Asthma may be treated with medicines and by staying away from the things that cause asthma attacks. Types of medicines may include: ?Controller medicines. These help prevent asthma symptoms. They are usually taken every day. ?Fast-acting reliever or rescue medicines. These quickly relieve asthma symptoms. They are used as needed and provide short-term relief. ?Allergy medicines if your attacks are brought on by allergens. ?Medicines to help control the body's defense (immune) system. ?Follow these instructions at home: ?Avoiding triggers in your home ?Change your heating and air conditioning filter often. ?Limit your use of fireplaces and wood stoves. ?Get rid of pests (such as roaches and mice) and their droppings. ?Throw away plants  if you see mold on them. ?Clean your floors. Dust regularly. Use cleaning products that do not smell. ?Have someone vacuum when you are not home. Use a vacuum cleaner with a HEPA filter if possible. ?Replace carpet with wood, tile, or vinyl flooring. Carpet can trap animal skin flakes and dust. ?Use allergy-proof pillows, mattress covers, and box spring covers. ?Wash bed sheets and blankets every week in hot water. Dry them in a dryer. ?Keep your bedroom free of any triggers. ?Avoid pets and keep windows closed when things that cause allergy symptoms are in the air. ?Use blankets that are made of polyester or cotton. ?Clean bathrooms and kitchens with bleach. If possible, have someone repaint the walls in these rooms with mold-resistant paint. Keep out of the rooms that are being cleaned and painted. ?Wash your hands often with soap and water. If soap and water are not available, use hand sanitizer. ?Do not allow anyone to smoke in your home. ?General instructions ?Take over-the-counter and prescription medicines only as told by your doctor. ?Talk with your doctor if you have questions about how or when to take your medicines. ?Make note if you need to use your medicines more often than usual. ?Do not use any products that contain nicotine or tobacco, such as cigarettes and e-cigarettes. If you need help quitting, ask your doctor. ?Stay away from secondhand smoke. ?Avoid doing things outdoors when allergen counts are high and when air quality is low. ?Wear a ski mask when doing outdoor activities in the winter. The mask should cover your nose and mouth. Exercise indoors on cold days if you can. ?Warm up before you exercise. Take time to cool down after exercise. ?Use a peak flow meter as   told by your doctor. A peak flow meter is a tool that measures how well the lungs are working. ?Keep track of the peak flow meter's readings. Write them down. ?Follow your asthma action plan. This is a written plan for taking care  of your asthma and treating your attacks. ?Make sure you get all the shots (vaccines) that your doctor recommends. Ask your doctor about a flu shot and a pneumonia shot. ?Keep all follow-up visits as told by your doctor. This is important. ?Contact a doctor if: ?You have wheezing, shortness of breath, or a cough even while taking medicine to prevent attacks. ?The mucus you cough up (sputum) is thicker than usual. ?The mucus you cough up changes from clear or white to yellow, green, gray, or bloody. ?You have problems from the medicine you are taking, such as: ?A rash. ?Itching. ?Swelling. ?Trouble breathing. ?You need reliever medicines more than 2-3 times a week. ?Your peak flow reading is still at 50-79% of your personal best after following the action plan for 1 hour. ?You have a fever. ?Get help right away if: ?You seem to be worse and are not responding to medicine during an asthma attack. ?You are short of breath even at rest. ?You get short of breath when doing very little activity. ?You have trouble eating, drinking, or talking. ?You have chest pain or tightness. ?You have a fast heartbeat. ?Your lips or fingernails start to turn blue. ?You are light-headed or dizzy, or you faint. ?Your peak flow is less than 50% of your personal best. ?You feel too tired to breathe normally. ?Summary ?Asthma is a long-term (chronic) condition in which the airways get tight and narrow. An asthma attack can make it hard to breathe. ?Asthma cannot be cured, but medicines and lifestyle changes can help control it. ?Make sure you understand how to avoid triggers and how and when to use your medicines. ?This information is not intended to replace advice given to you by your health care provider. Make sure you discuss any questions you have with your health care provider. ?Document Revised: 05/02/2019 Document Reviewed: 05/12/2019 ?Elsevier Patient Education ? 2022 Elsevier Inc. ? ?

## 2021-03-01 ENCOUNTER — Encounter: Payer: Self-pay | Admitting: Internal Medicine

## 2021-03-05 ENCOUNTER — Other Ambulatory Visit: Payer: Self-pay

## 2021-03-05 MED ORDER — LEVOTHYROXINE SODIUM 100 MCG PO TABS: 100.0000 ug | ORAL_TABLET | Freq: Every day | ORAL | 1 refills | Status: DC

## 2021-03-06 ENCOUNTER — Encounter: Payer: Self-pay | Admitting: Internal Medicine

## 2021-03-23 ENCOUNTER — Telehealth: Payer: Self-pay

## 2021-03-23 NOTE — Telephone Encounter (Signed)
Left vm to confirm 03/28/21 appointment-Toni ?

## 2021-03-28 ENCOUNTER — Ambulatory Visit: Payer: Managed Care, Other (non HMO) | Admitting: Internal Medicine

## 2021-03-28 ENCOUNTER — Other Ambulatory Visit: Payer: Self-pay

## 2021-03-28 ENCOUNTER — Telehealth: Payer: Self-pay

## 2021-03-28 DIAGNOSIS — J4520 Mild intermittent asthma, uncomplicated: Secondary | ICD-10-CM

## 2021-03-28 DIAGNOSIS — R062 Wheezing: Secondary | ICD-10-CM

## 2021-03-28 MED ORDER — ALBUTEROL SULFATE HFA 108 (90 BASE) MCG/ACT IN AERS: 2.0000 | INHALATION_SPRAY | Freq: Four times a day (QID) | RESPIRATORY_TRACT | 3 refills | Status: DC | PRN

## 2021-03-28 MED ORDER — ALBUTEROL SULFATE (2.5 MG/3ML) 0.083% IN NEBU: 2.5000 mg | INHALATION_SOLUTION | Freq: Four times a day (QID) | RESPIRATORY_TRACT | 12 refills | Status: DC | PRN

## 2021-03-28 MED ORDER — ALBUTEROL SULFATE (5 MG/ML) 0.5% IN NEBU: 2.5000 mg | INHALATION_SOLUTION | Freq: Four times a day (QID) | RESPIRATORY_TRACT | 12 refills | Status: DC | PRN

## 2021-03-28 MED ORDER — BUDESONIDE-FORMOTEROL FUMARATE 80-4.5 MCG/ACT IN AERO: 2.0000 | INHALATION_SPRAY | Freq: Two times a day (BID) | RESPIRATORY_TRACT | 3 refills | Status: DC

## 2021-03-29 ENCOUNTER — Other Ambulatory Visit: Payer: Self-pay | Admitting: Physician Assistant

## 2021-03-29 DIAGNOSIS — R062 Wheezing: Secondary | ICD-10-CM

## 2021-03-30 ENCOUNTER — Other Ambulatory Visit: Payer: Self-pay | Admitting: Internal Medicine

## 2021-03-30 NOTE — Telephone Encounter (Signed)
Done by [facility]

## 2021-04-01 NOTE — Procedures (Signed)
[facility] ?[place] ?[place], [address] ? ? ? ?Complete Pulmonary Function Testing Interpretation: ? ?FINDINGS: ? ?The forced vital capacity is mildly decreased.  FEV1 is 1.86 L which is 87% predicted and is normal.  Postbronchodilator no significant change in FEV1.  Total lung capacity is mildly decreased residual volume decreased residual on telemetry capacity ratio is decreased FRC was decreased.  DLCO was increased. ? ?IMPRESSION: ? ?This pulmonary function study is suggestive of mild restrictive lung disease clinical correlation is recommended ? ?Allyne Gee, MD FCCP ?Pulmonary Critical Care Medicine ?Sleep Medicine ? ?

## 2021-04-04 ENCOUNTER — Telehealth: Payer: Self-pay

## 2021-04-04 NOTE — Telephone Encounter (Signed)
Left vm to confirm 04/11/21 appointment-Toni ?

## 2021-04-06 ENCOUNTER — Encounter: Payer: Self-pay | Admitting: Internal Medicine

## 2021-04-06 LAB — PULMONARY FUNCTION TEST

## 2021-04-11 ENCOUNTER — Other Ambulatory Visit: Payer: Managed Care, Other (non HMO)

## 2021-04-12 ENCOUNTER — Encounter: Payer: Self-pay | Admitting: Nurse Practitioner

## 2021-04-12 ENCOUNTER — Telehealth: Payer: Managed Care, Other (non HMO) | Admitting: Nurse Practitioner

## 2021-04-12 VITALS — Temp 101.3°F

## 2021-04-12 DIAGNOSIS — J0100 Acute maxillary sinusitis, unspecified: Secondary | ICD-10-CM

## 2021-04-12 DIAGNOSIS — R051 Acute cough: Secondary | ICD-10-CM

## 2021-04-12 MED ORDER — AMOXICILLIN-POT CLAVULANATE 875-125 MG PO TABS: 1.0000 | ORAL_TABLET | Freq: Two times a day (BID) | ORAL | 0 refills | Status: DC

## 2021-04-12 MED ORDER — BENZONATATE 100 MG PO CAPS: 100.0000 mg | ORAL_CAPSULE | Freq: Two times a day (BID) | ORAL | 0 refills | Status: DC | PRN

## 2021-04-12 NOTE — Progress Notes (Signed)
[facility] ?[address] ?[place], [place] [address] ? ?Internal MEDICINE  ?Telephone Visit ? ?Patient Name: Beverly Campbell ? [id number]  ?[id number] ? ?Date of Service: 04/12/2021 ? ?I connected with the patient at 4:45 PM by telephone and verified the patients identity using two identifiers.   ?I discussed the limitations, risks, security and privacy concerns of performing an evaluation and management service by telephone and the availability of in person appointments. I also discussed with the patient that there may be a patient responsible charge related to the service.  The patient expressed understanding and agrees to proceed.   ? ?Chief Complaint  ?Patient presents with  ? Telephone Screen  ? Telephone Assessment  ?  [phone]  ? Cough  ? Headache  ? Generalized Body Aches  ?  Has a fever - 101.3 F  ? ? ?HPI ?Beverly Campbell presents for a telehealth virtual visit for symptoms of sinusitis or bronchitis. Patient reports fever 101.55F, body aches, headache and cough. She is also reporting nasal congestion, sinus pressure, fatigue, SOB and wheezing. She denies any sore throat, sneezing, runny nose or ear pain.  ? ? ?Current Medication: ?Outpatient Encounter Medications as of 04/12/2021  ?Medication Sig  ? albuterol (PROVENTIL) (2.5 MG/3ML) 0.083% nebulizer solution Take 3 mLs (2.5 mg total) by nebulization every 6 (six) hours as needed for wheezing or shortness of breath.  ? albuterol (PROVENTIL) (2.5 MG/3ML) 0.083% nebulizer solution Take 3 mLs (2.5 mg total) by nebulization every 6 (six) hours as needed for wheezing or shortness of breath.  ? albuterol (VENTOLIN HFA) 108 (90 Base) MCG/ACT inhaler Inhale 2 puffs into the lungs every 6 (six) hours as needed for wheezing or shortness of breath.  ? amoxicillin-clavulanate (AUGMENTIN) 875-125 MG tablet Take 1 tablet by mouth 2 (two) times daily.  ? budesonide-formoterol (SYMBICORT) 80-4.5 MCG/ACT inhaler Inhale 2 puffs into the lungs 2 (two)  times daily.  ? cyclobenzaprine (FLEXERIL) 5 MG tablet Take 1 tablet (5 mg total) by mouth 3 (three) times daily as needed for muscle spasms.  ? levothyroxine (SYNTHROID) 100 MCG tablet Take 1 tablet (100 mcg total) by mouth daily.  ? Multiple Vitamins-Iron (MULTIVITAMIN/IRON PO) Take by mouth.  ? triamcinolone (KENALOG) 0.025 % cream Apply 1 application topically 2 (two) times daily.  ? [DISCONTINUED] benzonatate (TESSALON) 100 MG capsule Take 1 capsule (100 mg total) by mouth 2 (two) times daily as needed for cough.  ? benzonatate (TESSALON) 100 MG capsule Take 1 capsule (100 mg total) by mouth 2 (two) times daily as needed for cough.  ? ?No facility-administered encounter medications on file as of 04/12/2021.  ? ? ?Surgical History: ?Past Surgical History:  ?Procedure Laterality Date  ? COLONOSCOPY WITH PROPOFOL N/A 01/01/2016  ? Procedure: COLONOSCOPY WITH PROPOFOL;  Surgeon: Jonathon Bellows, MD;  Location: [facility] ENDOSCOPY;  Service: Endoscopy;  Laterality: N/A;  ? ESOPHAGOGASTRODUODENOSCOPY (EGD) WITH PROPOFOL N/A 01/01/2016  ? Procedure: ESOPHAGOGASTRODUODENOSCOPY (EGD) WITH PROPOFOL;  Surgeon: Jonathon Bellows, MD;  Location: [facility] ENDOSCOPY;  Service: Endoscopy;  Laterality: N/A;  ? THYROID SURGERY  1997  ? had thyroid removed  ? TUBAL LIGATION  1995  ? ? ?Medical History: ?Past Medical History:  ?Diagnosis Date  ? Anemia   ? Asthma   ? Hypothyroidism   ? Thyroid disease   ? ? ?Family History: ?Family History  ?Problem Relation Age of Onset  ? Colon cancer Father   ? Hypertension Mother   ? Diabetes Mother   ? Breast cancer Neg Hx   ? ? ?  Social History  ? ?Socioeconomic History  ? Marital status: Divorced  ?  Spouse name: Not on file  ? Number of children: Not on file  ? Years of education: Not on file  ? Highest education level: Not on file  ?Occupational History  ? Not on file  ?Tobacco Use  ? Smoking status: Never  ? Smokeless tobacco: Never  ?Substance and Sexual Activity  ? Alcohol use: Yes  ?  Comment: occasionally   ? Drug use: No  ? Sexual activity: Yes  ?  Birth control/protection: None  ?Other Topics Concern  ? Not on file  ?Social History Narrative  ? Not on file  ? ?Social Determinants of Health  ? ?Financial Resource Strain: Not on file  ?Food Insecurity: Not on file  ?Transportation Needs: Not on file  ?Physical Activity: Not on file  ?Stress: Not on file  ?Social Connections: Not on file  ?Intimate Partner Violence: Not on file  ? ? ? ? ?Review of Systems  ?Constitutional:  Positive for chills, fatigue and fever.  ?HENT:  Positive for congestion, postnasal drip, sinus pressure and sinus pain. Negative for ear pain, rhinorrhea, sneezing and sore throat.   ?Respiratory:  Positive for cough, chest tightness, shortness of breath and wheezing.   ?Cardiovascular: Negative.  Negative for chest pain and palpitations.  ?Gastrointestinal:  Negative for abdominal pain, constipation, diarrhea, nausea and vomiting.  ?Musculoskeletal:  Positive for myalgias.  ?Neurological:  Positive for headaches.  ? ?Vital Signs: ?Temp (!) 101.3 ?F (38.5 ?C)   LMP 04/20/2019  ? ? ?Observation/Objective: ?She is alert and oriented and engages in conversation appropriately. She does not sound as though she is in any acute distress over telephone call.  ? ? ? ?Assessment/Plan: ?1. Acute non-recurrent maxillary sinusitis ?Empiric antibiotic treatment prescribed.  ?- amoxicillin-clavulanate (AUGMENTIN) 875-125 MG tablet; Take 1 tablet by mouth 2 (two) times daily.  Dispense: 20 tablet; Refill: 0 ? ?2. Acute cough ?Medication refill sent for symptomatic relief of cough. ?- benzonatate (TESSALON) 100 MG capsule; Take 1 capsule (100 mg total) by mouth 2 (two) times daily as needed for cough.  Dispense: 20 capsule; Refill: 0 ? ? ?General Counseling: Beverly Campbell verbalizes understanding of the findings of today's phone visit and agrees with plan of treatment. I have discussed any further diagnostic evaluation that may be needed or ordered today. We also  reviewed her medications today. she has been encouraged to call the office with any questions or concerns that should arise related to todays visit. ? ?Return if symptoms worsen or fail to improve. ? ? ?No orders of the defined types were placed in this encounter. ? ? ?Meds ordered this encounter  ?Medications  ? amoxicillin-clavulanate (AUGMENTIN) 875-125 MG tablet  ?  Sig: Take 1 tablet by mouth 2 (two) times daily.  ?  Dispense:  20 tablet  ?  Refill:  0  ? benzonatate (TESSALON) 100 MG capsule  ?  Sig: Take 1 capsule (100 mg total) by mouth 2 (two) times daily as needed for cough.  ?  Dispense:  20 capsule  ?  Refill:  0  ? ? ?Time spent:10 Minutes ?Time spent with patient included reviewing progress notes, labs, imaging studies, and discussing plan for follow up.  ?[place] Controlled Substance Database was reviewed by me for overdose risk score (ORS) if appropriate. ? ?This patient was seen by Jonetta Osgood, FNP-C in collaboration with Dr. Clayborn Bigness as a part of collaborative care agreement. ? ?Zarin Hagmann R. De Libman,  MSN, FNP-C ?Internal medicine  ?

## 2021-04-30 ENCOUNTER — Telehealth: Payer: Self-pay

## 2021-04-30 NOTE — Telephone Encounter (Signed)
Left vm to confirm 05/03/21 appointment-Toni ?

## 2021-05-02 NOTE — Telephone Encounter (Signed)
Error

## 2021-05-03 ENCOUNTER — Ambulatory Visit (INDEPENDENT_AMBULATORY_CARE_PROVIDER_SITE_OTHER): Payer: Managed Care, Other (non HMO) | Admitting: Nurse Practitioner

## 2021-05-03 ENCOUNTER — Encounter: Payer: Self-pay | Admitting: Nurse Practitioner

## 2021-05-03 VITALS — BP 118/83 | HR 78 | Temp 98.4°F | Resp 16 | Ht 61.0 in | Wt 174.4 lb

## 2021-05-03 DIAGNOSIS — E039 Hypothyroidism, unspecified: Secondary | ICD-10-CM

## 2021-05-03 DIAGNOSIS — R5383 Other fatigue: Secondary | ICD-10-CM

## 2021-05-03 DIAGNOSIS — E782 Mixed hyperlipidemia: Secondary | ICD-10-CM

## 2021-05-03 DIAGNOSIS — J301 Allergic rhinitis due to pollen: Secondary | ICD-10-CM

## 2021-05-03 DIAGNOSIS — D509 Iron deficiency anemia, unspecified: Secondary | ICD-10-CM

## 2021-05-03 DIAGNOSIS — Z0001 Encounter for general adult medical examination with abnormal findings: Secondary | ICD-10-CM

## 2021-05-03 DIAGNOSIS — J4520 Mild intermittent asthma, uncomplicated: Secondary | ICD-10-CM | POA: Diagnosis not present

## 2021-05-03 DIAGNOSIS — Z1231 Encounter for screening mammogram for malignant neoplasm of breast: Secondary | ICD-10-CM

## 2021-05-03 DIAGNOSIS — Z780 Asymptomatic menopausal state: Secondary | ICD-10-CM

## 2021-05-03 DIAGNOSIS — E559 Vitamin D deficiency, unspecified: Secondary | ICD-10-CM

## 2021-05-03 NOTE — Progress Notes (Signed)
[facility] ?[address] ?[place], [place] [address] ? ?Internal MEDICINE  ?Office Visit Note ? ?Patient Name: Hoa Fregeau ? [id number]  ?[id number] ? ?Date of Service: 05/03/2021 ? ?Chief Complaint  ?Patient presents with  ? Annual Exam  ? Results  ?  Review PFT  ? ? ?HPI ?Beverly Campbell presents for an annual well visit and physical exam.  She is a well-appearing 56 year old female with mild intermittent asthma, hypothyroidism, vitamin D deficiency, and a history of anemia.  She was recently treated for an upper respiratory infection and has been feeling much better since taking the antibiotic.  She also had a pulmonary function test done recently and the result was mild restrictive lung disease per Dr. Derrek Gu report.  ?Her blood pressure and vital signs are stable and within normal limits.  She is a non-smoker and states that she does occasionally drink alcohol but just every once in a while, and denies any recreational drug use. ?She is due for routine labs and routine screening mammogram as well as routine BMD screening. ? ? ? ?Current Medication: ?Outpatient Encounter Medications as of 05/03/2021  ?Medication Sig  ? albuterol (PROVENTIL) (2.5 MG/3ML) 0.083% nebulizer solution Take 3 mLs (2.5 mg total) by nebulization every 6 (six) hours as needed for wheezing or shortness of breath.  ? albuterol (PROVENTIL) (2.5 MG/3ML) 0.083% nebulizer solution Take 3 mLs (2.5 mg total) by nebulization every 6 (six) hours as needed for wheezing or shortness of breath.  ? albuterol (VENTOLIN HFA) 108 (90 Base) MCG/ACT inhaler Inhale 2 puffs into the lungs every 6 (six) hours as needed for wheezing or shortness of breath.  ? amoxicillin-clavulanate (AUGMENTIN) 875-125 MG tablet Take 1 tablet by mouth 2 (two) times daily.  ? benzonatate (TESSALON) 100 MG capsule Take 1 capsule (100 mg total) by mouth 2 (two) times daily as needed for cough.  ? budesonide-formoterol (SYMBICORT) 80-4.5 MCG/ACT inhaler  Inhale 2 puffs into the lungs 2 (two) times daily.  ? levothyroxine (SYNTHROID) 100 MCG tablet Take 1 tablet (100 mcg total) by mouth daily.  ? Multiple Vitamins-Iron (MULTIVITAMIN/IRON PO) Take by mouth.  ? triamcinolone (KENALOG) 0.025 % cream Apply 1 application topically 2 (two) times daily.  ? [DISCONTINUED] cyclobenzaprine (FLEXERIL) 5 MG tablet Take 1 tablet (5 mg total) by mouth 3 (three) times daily as needed for muscle spasms.  ? ?No facility-administered encounter medications on file as of 05/03/2021.  ? ? ?Surgical History: ?Past Surgical History:  ?Procedure Laterality Date  ? COLONOSCOPY WITH PROPOFOL N/A 01/01/2016  ? Procedure: COLONOSCOPY WITH PROPOFOL;  Surgeon: Jonathon Bellows, MD;  Location: [facility] ENDOSCOPY;  Service: Endoscopy;  Laterality: N/A;  ? ESOPHAGOGASTRODUODENOSCOPY (EGD) WITH PROPOFOL N/A 01/01/2016  ? Procedure: ESOPHAGOGASTRODUODENOSCOPY (EGD) WITH PROPOFOL;  Surgeon: Jonathon Bellows, MD;  Location: [facility] ENDOSCOPY;  Service: Endoscopy;  Laterality: N/A;  ? THYROID SURGERY  1997  ? had thyroid removed  ? TUBAL LIGATION  1995  ? ? ?Medical History: ?Past Medical History:  ?Diagnosis Date  ? Anemia   ? Asthma   ? Hypothyroidism   ? Thyroid disease   ? ? ?Family History: ?Family History  ?Problem Relation Age of Onset  ? Colon cancer Father   ? Hypertension Mother   ? Diabetes Mother   ? Breast cancer Neg Hx   ? ? ?Social History  ? ?Socioeconomic History  ? Marital status: Divorced  ?  Spouse name: Not on file  ? Number of children: Not on file  ? Years of  education: Not on file  ? Highest education level: Not on file  ?Occupational History  ? Not on file  ?Tobacco Use  ? Smoking status: Never  ? Smokeless tobacco: Never  ?Substance and Sexual Activity  ? Alcohol use: Yes  ?  Comment: occasionally  ? Drug use: No  ? Sexual activity: Yes  ?  Birth control/protection: None  ?Other Topics Concern  ? Not on file  ?Social History Narrative  ? Not on file  ? ?Social Determinants of Health  ? ?Financial  Resource Strain: Not on file  ?Food Insecurity: Not on file  ?Transportation Needs: Not on file  ?Physical Activity: Not on file  ?Stress: Not on file  ?Social Connections: Not on file  ?Intimate Partner Violence: Not on file  ? ? ? ? ?Review of Systems  ?Constitutional:  Negative for activity change, appetite change, chills, fatigue, fever and unexpected weight change.  ?HENT: Negative.  Negative for congestion, ear pain, rhinorrhea, sore throat and trouble swallowing.   ?Eyes: Negative.   ?Respiratory: Negative.  Negative for cough, chest tightness, shortness of breath and wheezing.   ?Cardiovascular: Negative.  Negative for chest pain.  ?Gastrointestinal: Negative.  Negative for abdominal pain, blood in stool, constipation, diarrhea, nausea and vomiting.  ?Endocrine: Negative.   ?Genitourinary: Negative.  Negative for difficulty urinating, dysuria, frequency, hematuria and urgency.  ?Musculoskeletal: Negative.  Negative for arthralgias, back pain, joint swelling, myalgias and neck pain.  ?Skin: Negative.  Negative for rash and wound.  ?Allergic/Immunologic: Negative.  Negative for immunocompromised state.  ?Neurological: Negative.  Negative for dizziness, seizures, numbness and headaches.  ?Hematological: Negative.   ?Psychiatric/Behavioral: Negative.  Negative for behavioral problems, self-injury and suicidal ideas. The patient is not nervous/anxious.   ? ?Vital Signs: ?BP 118/83   Pulse 78   Temp 98.4 ?F (36.9 ?C)   Resp 16   Ht '5\' 1"'$  (1.549 m)   Wt 174 lb 6.4 oz (79.1 kg)   LMP 04/20/2019   SpO2 97%   BMI 32.95 kg/m?  ? ? ?Physical Exam ?Vitals reviewed.  ?Constitutional:   ?   General: She is awake. She is not in acute distress. ?   Appearance: Normal appearance. She is well-developed and well-groomed. She is not ill-appearing or diaphoretic.  ?HENT:  ?   Head: Normocephalic and atraumatic.  ?   Right Ear: Tympanic membrane, ear canal and external ear normal.  ?   Left Ear: Tympanic membrane, ear  canal and external ear normal.  ?   Nose: Nose normal. No congestion or rhinorrhea.  ?   Mouth/Throat:  ?   Lips: Pink.  ?   Mouth: Mucous membranes are moist.  ?   Pharynx: Oropharynx is clear. Uvula midline. No oropharyngeal exudate or posterior oropharyngeal erythema.  ?Eyes:  ?   General: Lids are normal. Vision grossly intact. Gaze aligned appropriately. No scleral icterus.    ?   Right eye: No discharge.     ?   Left eye: No discharge.  ?   Extraocular Movements: Extraocular movements intact.  ?   Conjunctiva/sclera: Conjunctivae normal.  ?   Pupils: Pupils are equal, round, and reactive to light.  ?   Funduscopic exam: ?   Right eye: Red reflex present.     ?   Left eye: Red reflex present. ?Neck:  ?   Thyroid: No thyromegaly.  ?   Vascular: No JVD.  ?   Trachea: Trachea and phonation normal. No tracheal deviation.  ?Cardiovascular:  ?  Rate and Rhythm: Normal rate and regular rhythm.  ?   Pulses: Normal pulses.  ?   Heart sounds: Normal heart sounds, S1 normal and S2 normal. No murmur heard. ?  No friction rub. No gallop.  ?Pulmonary:  ?   Effort: Pulmonary effort is normal. No accessory muscle usage or respiratory distress.  ?   Breath sounds: Normal breath sounds and air entry. No stridor. No wheezing or rales.  ?Chest:  ?   Chest wall: No tenderness.  ?   Comments: Declined clinical breast exam, gets routine mammograms.  ?Abdominal:  ?   General: Bowel sounds are normal. There is no distension.  ?   Palpations: Abdomen is soft. There is no shifting dullness, fluid wave, mass or pulsatile mass.  ?   Tenderness: There is no abdominal tenderness. There is no guarding or rebound.  ?Musculoskeletal:     ?   General: No tenderness or deformity. Normal range of motion.  ?   Cervical back: Normal range of motion and neck supple.  ?   Right lower leg: No edema.  ?   Left lower leg: No edema.  ?Lymphadenopathy:  ?   Cervical: No cervical adenopathy.  ?Skin: ?   General: Skin is warm and dry.  ?   Capillary Refill:  Capillary refill takes less than 2 seconds.  ?   Coloration: Skin is not pale.  ?   Findings: No erythema or rash.  ?Neurological:  ?   Mental Status: She is alert and oriented to person, place, and time.  ?   Cra

## 2021-05-12 ENCOUNTER — Encounter: Payer: Self-pay | Admitting: Nurse Practitioner

## 2021-06-06 ENCOUNTER — Other Ambulatory Visit: Payer: Managed Care, Other (non HMO)

## 2021-06-06 ENCOUNTER — Telehealth: Payer: Self-pay

## 2021-06-06 DIAGNOSIS — K7689 Other specified diseases of liver: Secondary | ICD-10-CM | POA: Diagnosis not present

## 2021-06-06 NOTE — Telephone Encounter (Signed)
Patient came into office this morning for u/s. I explained to her she was not scheduled for one but was a no show for 04/11/21. She stated she had called stating she was sick and was told by whoever she talked to that she was put on for this morning and she put it on her phone. I tried to explain the system does not show that she was ever put on schedule for today and that appointment had been cancelled or rescheduled. Since 8:00/8:30 patient was a no show, I put patient on schedule to have u/s done-Toni ?

## 2021-06-22 ENCOUNTER — Encounter: Payer: Self-pay | Admitting: Nurse Practitioner

## 2021-06-22 ENCOUNTER — Ambulatory Visit: Payer: Managed Care, Other (non HMO) | Admitting: Nurse Practitioner

## 2021-06-22 VITALS — BP 112/77 | HR 81 | Temp 98.1°F | Resp 16 | Ht 61.0 in | Wt 172.6 lb

## 2021-06-22 DIAGNOSIS — K760 Fatty (change of) liver, not elsewhere classified: Secondary | ICD-10-CM

## 2021-06-22 NOTE — Progress Notes (Signed)
[facility] [place], [place] [address]  Internal MEDICINE  Office Visit Note  Patient Name: Beverly Campbell  [id number]  [id number]  Date of Service: 06/22/2021  Chief Complaint  Patient presents with   Follow-up   Asthma   Results    [place]    HPI Beverly Campbell presents for a follow-up visit to discuss the results of her liver ultrasound.  The ultrasound showed mild fatty liver disease and a simple cyst of the left lobe of the liver.  All other findings were normal.  Discussed causes and risk factors of fatty liver disease with the patient and ways to prevent further progression. Patient still has routine labs from her annual physical in April to have drawn and was reminded to have that done at her earliest convenience.     Current Medication: Outpatient Encounter Medications as of 06/22/2021  Medication Sig   albuterol (PROVENTIL) (2.5 MG/3ML) 0.083% nebulizer solution Take 3 mLs (2.5 mg total) by nebulization every 6 (six) hours as needed for wheezing or shortness of breath.   albuterol (PROVENTIL) (2.5 MG/3ML) 0.083% nebulizer solution Take 3 mLs (2.5 mg total) by nebulization every 6 (six) hours as needed for wheezing or shortness of breath.   albuterol (VENTOLIN HFA) 108 (90 Base) MCG/ACT inhaler Inhale 2 puffs into the lungs every 6 (six) hours as needed for wheezing or shortness of breath.   amoxicillin-clavulanate (AUGMENTIN) 875-125 MG tablet Take 1 tablet by mouth 2 (two) times daily.   benzonatate (TESSALON) 100 MG capsule Take 1 capsule (100 mg total) by mouth 2 (two) times daily as needed for cough.   budesonide-formoterol (SYMBICORT) 80-4.5 MCG/ACT inhaler Inhale 2 puffs into the lungs 2 (two) times daily.   levothyroxine (SYNTHROID) 100 MCG tablet Take 1 tablet (100 mcg total) by mouth daily.   Multiple Vitamins-Iron (MULTIVITAMIN/IRON PO) Take by mouth.   triamcinolone (KENALOG) 0.025 % cream Apply 1 application topically 2 (two) times daily.    No facility-administered encounter medications on file as of 06/22/2021.    Surgical History: Past Surgical History:  Procedure Laterality Date   COLONOSCOPY WITH PROPOFOL N/A 01/01/2016   Procedure: COLONOSCOPY WITH PROPOFOL;  Surgeon: Jonathon Bellows, MD;  Location: [facility] ENDOSCOPY;  Service: Endoscopy;  Laterality: N/A;   ESOPHAGOGASTRODUODENOSCOPY (EGD) WITH PROPOFOL N/A 01/01/2016   Procedure: ESOPHAGOGASTRODUODENOSCOPY (EGD) WITH PROPOFOL;  Surgeon: Jonathon Bellows, MD;  Location: [facility] ENDOSCOPY;  Service: Endoscopy;  Laterality: N/A;   THYROID SURGERY  1997   had thyroid removed   TUBAL LIGATION  1995    Medical History: Past Medical History:  Diagnosis Date   Anemia    Asthma    Hypothyroidism    Thyroid disease     Family History: Family History  Problem Relation Age of Onset   Colon cancer Father    Hypertension Mother    Diabetes Mother    Breast cancer Neg Hx     Social History   Socioeconomic History   Marital status: Divorced    Spouse name: Not on file   Number of children: Not on file   Years of education: Not on file   Highest education level: Not on file  Occupational History   Not on file  Tobacco Use   Smoking status: Never   Smokeless tobacco: Never  Substance and Sexual Activity   Alcohol use: Yes    Comment: occasionally   Drug use: No   Sexual activity: Yes    Birth control/protection: None  Other Topics Concern  Not on file  Social History Narrative   Not on file   Social Determinants of Health   Financial Resource Strain: Not on file  Food Insecurity: Not on file  Transportation Needs: Not on file  Physical Activity: Not on file  Stress: Not on file  Social Connections: Not on file  Intimate Partner Violence: Not on file      Review of Systems  Constitutional:  Negative for chills, fatigue and unexpected weight change.  HENT:  Negative for congestion, rhinorrhea, sneezing and sore throat.   Eyes:  Negative for redness.   Respiratory:  Negative for cough, chest tightness and shortness of breath.   Cardiovascular:  Negative for chest pain and palpitations.  Gastrointestinal:  Negative for abdominal pain, constipation, diarrhea, nausea and vomiting.  Genitourinary:  Negative for dysuria and frequency.  Musculoskeletal:  Negative for arthralgias, back pain, joint swelling and neck pain.  Skin:  Negative for rash.  Neurological: Negative.  Negative for tremors and numbness.  Hematological:  Negative for adenopathy. Does not bruise/bleed easily.  Psychiatric/Behavioral:  Negative for behavioral problems (Depression), sleep disturbance and suicidal ideas. The patient is not nervous/anxious.     Vital Signs: BP 112/77   Pulse 81   Temp 98.1 F (36.7 C)   Resp 16   Ht '5\' 1"'$  (1.549 m)   Wt 172 lb 9.6 oz (78.3 kg)   LMP 04/20/2019   SpO2 97%   BMI 32.61 kg/m    Physical Exam Vitals reviewed.  Constitutional:      Appearance: Normal appearance. She is obese.  HENT:     Head: Normocephalic and atraumatic.  Eyes:     Pupils: Pupils are equal, round, and reactive to light.  Cardiovascular:     Rate and Rhythm: Normal rate and regular rhythm.  Pulmonary:     Effort: Pulmonary effort is normal. No respiratory distress.  Neurological:     Mental Status: She is alert and oriented to person, place, and time.  Psychiatric:        Mood and Affect: Mood normal.        Behavior: Behavior normal.       Assessment/Plan: 1. 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 No medication changes at this time. Provided patient with information about fatty liver and diet modifications that will benefit her liver function.    General Counseling: Beverly Campbell verbalizes understanding of the findings of todays visit and agrees with plan of treatment. I have discussed any further diagnostic evaluation that may be needed or ordered  today. We also reviewed her medications today. she has been encouraged to call the office with any questions or concerns that should arise related to todays visit.    No orders of the defined types were placed in this encounter.   No orders of the defined types were placed in this encounter.   Return for previously scheduled, CPE, Marcques Wrightsman PCP next year, may call as needed before then.   Total time spent:20 Minutes Time spent includes review of chart, medications, test results, and follow up plan with the patient.   [place] Controlled Substance Database was reviewed by me.  This patient was seen by Beverly Osgood, FNP-C in collaboration with Dr. Clayborn Bigness as a part of collaborative care agreement.   Marrion Finan R. Valetta Fuller, MSN, FNP-C Internal medicine

## 2021-06-23 LAB — CMP14+EGFR
ALT: 12 IU/L (ref 0–32)
AST: 16 IU/L (ref 0–40)
Albumin/Globulin Ratio: 2 (ref 1.2–2.2)
Albumin: 4.7 g/dL (ref 3.8–4.9)
Alkaline Phosphatase: 75 IU/L (ref 44–121)
BUN/Creatinine Ratio: 19 (ref 9–23)
BUN: 19 mg/dL (ref 6–24)
Bilirubin Total: 1 mg/dL (ref 0.0–1.2)
CO2: 25 mmol/L (ref 20–29)
Calcium: 9.8 mg/dL (ref 8.7–10.2)
Chloride: 103 mmol/L (ref 96–106)
Creatinine, Ser: 0.98 mg/dL (ref 0.57–1.00)
Globulin, Total: 2.4 g/dL (ref 1.5–4.5)
Glucose: 90 mg/dL (ref 70–99)
Potassium: 4.8 mmol/L (ref 3.5–5.2)
Sodium: 143 mmol/L (ref 134–144)
Total Protein: 7.1 g/dL (ref 6.0–8.5)
eGFR: 68 mL/min/{1.73_m2} (ref >59)

## 2021-06-23 LAB — CBC WITH DIFFERENTIAL/PLATELET
Basophils Absolute: 0.1 10*3/uL (ref 0.0–0.2)
Basos: 2 %
EOS (ABSOLUTE): 0.7 10*3/uL — ABNORMAL HIGH (ref 0.0–0.4)
Eos: 12 %
Hematocrit: 35.2 % (ref 34.0–46.6)
Hemoglobin: 11.3 g/dL (ref 11.1–15.9)
Immature Grans (Abs): 0 10*3/uL (ref 0.0–0.1)
Immature Granulocytes: 0 %
Lymphocytes Absolute: 1.9 10*3/uL (ref 0.7–3.1)
Lymphs: 33 %
MCH: 20.1 pg — ABNORMAL LOW (ref 26.6–33.0)
MCHC: 32.1 g/dL (ref 31.5–35.7)
MCV: 63 fL — ABNORMAL LOW (ref 79–97)
Monocytes Absolute: 0.5 10*3/uL (ref 0.1–0.9)
Monocytes: 8 %
Neutrophils Absolute: 2.6 10*3/uL (ref 1.4–7.0)
Neutrophils: 45 %
Platelets: 214 10*3/uL (ref 150–450)
RBC: 5.61 x10E6/uL — ABNORMAL HIGH (ref 3.77–5.28)
RDW: 17.6 % — ABNORMAL HIGH (ref 11.7–15.4)
WBC: 5.7 10*3/uL (ref 3.4–10.8)

## 2021-06-23 LAB — LIPID PANEL
Chol/HDL Ratio: 2.5 ratio (ref 0.0–4.4)
Cholesterol, Total: 199 mg/dL (ref 100–199)
HDL: 79 mg/dL (ref >39)
LDL Chol Calc (NIH): 99 mg/dL (ref 0–99)
Triglycerides: 124 mg/dL (ref 0–149)
VLDL Cholesterol Cal: 21 mg/dL (ref 5–40)

## 2021-06-23 LAB — IRON,TIBC AND FERRITIN PANEL
Ferritin: 217 ng/mL — ABNORMAL HIGH (ref 15–150)
Iron Saturation: 21 % (ref 15–55)
Iron: 64 ug/dL (ref 27–159)
Total Iron Binding Capacity: 306 ug/dL (ref 250–450)
UIBC: 242 ug/dL (ref 131–425)

## 2021-06-23 LAB — TSH+FREE T4
Free T4: 1.09 ng/dL (ref 0.82–1.77)
TSH: 3.28 u[IU]/mL (ref 0.450–4.500)

## 2021-06-23 LAB — VITAMIN D 25 HYDROXY (VIT D DEFICIENCY, FRACTURES): Vit D, 25-Hydroxy: 24.6 ng/mL — ABNORMAL LOW (ref 30.0–100.0)

## 2021-06-23 LAB — B12 AND FOLATE PANEL
Folate: 11.5 ng/mL (ref >3.0)
Vitamin B-12: 546 pg/mL (ref 232–1245)

## 2021-07-16 ENCOUNTER — Other Ambulatory Visit: Payer: Self-pay | Admitting: Nurse Practitioner

## 2021-08-08 ENCOUNTER — Encounter: Payer: Self-pay | Admitting: Nurse Practitioner

## 2021-08-28 ENCOUNTER — Ambulatory Visit: Payer: Managed Care, Other (non HMO) | Admitting: Nurse Practitioner

## 2021-08-28 VITALS — BP 126/80 | HR 71 | Temp 97.8°F | Resp 16 | Ht 61.0 in | Wt 173.0 lb

## 2021-08-28 DIAGNOSIS — J4520 Mild intermittent asthma, uncomplicated: Secondary | ICD-10-CM

## 2021-08-28 DIAGNOSIS — R0602 Shortness of breath: Secondary | ICD-10-CM | POA: Diagnosis not present

## 2021-08-28 NOTE — Progress Notes (Signed)
[facility] [place], [place] [address]  Internal MEDICINE  Office Visit Note  Patient Name: Beverly Campbell  [id number]  [id number]  Date of Service: 08/28/2021  Chief Complaint  Patient presents with   Follow-up    HPI Beverly Campbell presents for a follow up visit for asthma and SOB.  --spiro done in office --mild restrictive airway --slight decrease in FEV1 and FVC, no change in ratio.  --denies any SOB, chest tightness or wheezing, sometimes has occasional cough.  --denies any asthma attack or hospitalizations.  --takes OTC elixir from health food store called Bronchial wellness syrup, made by [employer]. All natural ingredients --has albuterol rescue inhaler and nebulizer solution prn. Also has symbicort inhaler but only uses it when needed and not every day.    Current Medication: Outpatient Encounter Medications as of 08/28/2021  Medication Sig   albuterol (PROVENTIL) (2.5 MG/3ML) 0.083% nebulizer solution Take 3 mLs (2.5 mg total) by nebulization every 6 (six) hours as needed for wheezing or shortness of breath.   albuterol (VENTOLIN HFA) 108 (90 Base) MCG/ACT inhaler Inhale 2 puffs into the lungs every 6 (six) hours as needed for wheezing or shortness of breath.   amoxicillin-clavulanate (AUGMENTIN) 875-125 MG tablet Take 1 tablet by mouth 2 (two) times daily.   benzonatate (TESSALON) 100 MG capsule Take 1 capsule (100 mg total) by mouth 2 (two) times daily as needed for cough.   budesonide-formoterol (SYMBICORT) 80-4.5 MCG/ACT inhaler Inhale 2 puffs into the lungs 2 (two) times daily.   levothyroxine (SYNTHROID) 100 MCG tablet TAKE 1 TABLET BY MOUTH DAILY   Multiple Vitamins-Iron (MULTIVITAMIN/IRON PO) Take by mouth.   triamcinolone (KENALOG) 0.025 % cream Apply 1 application topically 2 (two) times daily.   [DISCONTINUED] albuterol (PROVENTIL) (2.5 MG/3ML) 0.083% nebulizer solution Take 3 mLs (2.5 mg total) by nebulization every 6 (six) hours as needed  for wheezing or shortness of breath.   No facility-administered encounter medications on file as of 08/28/2021.    Surgical History: Past Surgical History:  Procedure Laterality Date   COLONOSCOPY WITH PROPOFOL N/A 01/01/2016   Procedure: COLONOSCOPY WITH PROPOFOL;  Surgeon: Jonathon Bellows, MD;  Location: [facility] ENDOSCOPY;  Service: Endoscopy;  Laterality: N/A;   ESOPHAGOGASTRODUODENOSCOPY (EGD) WITH PROPOFOL N/A 01/01/2016   Procedure: ESOPHAGOGASTRODUODENOSCOPY (EGD) WITH PROPOFOL;  Surgeon: Jonathon Bellows, MD;  Location: [facility] ENDOSCOPY;  Service: Endoscopy;  Laterality: N/A;   THYROID SURGERY  1997   had thyroid removed   TUBAL LIGATION  1995    Medical History: Past Medical History:  Diagnosis Date   Anemia    Asthma    Hypothyroidism    Thyroid disease     Family History: Family History  Problem Relation Age of Onset   Colon cancer Father    Hypertension Mother    Diabetes Mother    Breast cancer Neg Hx     Social History   Socioeconomic History   Marital status: Divorced    Spouse name: Not on file   Number of children: Not on file   Years of education: Not on file   Highest education level: Not on file  Occupational History   Not on file  Tobacco Use   Smoking status: Never   Smokeless tobacco: Never  Substance and Sexual Activity   Alcohol use: Yes    Comment: occasionally   Drug use: No   Sexual activity: Yes    Birth control/protection: None  Other Topics Concern   Not on file  Social History  Narrative   Not on file   Social Determinants of Health   Financial Resource Strain: Not on file  Food Insecurity: Not on file  Transportation Needs: Not on file  Physical Activity: Not on file  Stress: Not on file  Social Connections: Not on file  Intimate Partner Violence: Not on file      Review of Systems  Constitutional:  Negative for appetite change, chills, fatigue and fever.  HENT:  Positive for postnasal drip. Negative for congestion, rhinorrhea,  sinus pressure and sneezing.   Respiratory:  Positive for cough (occasional, mild). Negative for chest tightness, shortness of breath and wheezing.   Cardiovascular: Negative.  Negative for chest pain and palpitations.    Vital Signs: BP 126/80   Pulse 71   Temp 97.8 F (36.6 C)   Resp 16   Ht '5\' 1"'$  (1.549 m)   Wt 173 lb (78.5 kg)   LMP 04/20/2019   SpO2 96%   BMI 32.69 kg/m    Physical Exam Vitals reviewed.  Constitutional:      General: She is not in acute distress.    Appearance: Normal appearance. She is normal weight. She is not ill-appearing.  HENT:     Head: Normocephalic and atraumatic.  Eyes:     Pupils: Pupils are equal, round, and reactive to light.  Cardiovascular:     Rate and Rhythm: Normal rate and regular rhythm.     Heart sounds: Normal heart sounds. No murmur heard. Pulmonary:     Effort: Pulmonary effort is normal. No respiratory distress.     Breath sounds: Normal breath sounds. No wheezing.  Neurological:     Mental Status: She is alert and oriented to person, place, and time.  Psychiatric:        Mood and Affect: Mood normal.        Behavior: Behavior normal.        Assessment/Plan: 1. Mild intermittent asthma without complication Stable, use prn inhalers and neb treatments as prescribed. 6 month follow up   2. SOB (shortness of breath) See problem #1 - Spirometry with Graph   General Counseling: Beverly Campbell verbalizes understanding of the findings of todays visit and agrees with plan of treatment. I have discussed any further diagnostic evaluation that may be needed or ordered today. We also reviewed her medications today. she has been encouraged to call the office with any questions or concerns that should arise related to todays visit.    Orders Placed This Encounter  Procedures   Spirometry with Graph    No orders of the defined types were placed in this encounter.   Return in about 6 months (around 02/28/2022) for F/U,  pulmonary only, DSK or Lauren.   Total time spent:20 Minutes Time spent includes review of chart, medications, test results, and follow up plan with the patient.   [place] Controlled Substance Database was reviewed by me.  This patient was seen by Jonetta Osgood, FNP-C in collaboration with Dr. Clayborn Bigness as a part of collaborative care agreement.   Travis Purk R. Valetta Fuller, MSN, FNP-C Internal medicine

## 2021-10-13 ENCOUNTER — Encounter: Payer: Self-pay | Admitting: Nurse Practitioner

## 2021-12-25 ENCOUNTER — Ambulatory Visit
Admission: RE | Admit: 2021-12-25 | Discharge: 2021-12-25 | Disposition: A | Discharge status: Home / Self Care | Payer: Managed Care, Other (non HMO) | Source: Ambulatory Visit | Attending: Nurse Practitioner | Admitting: Nurse Practitioner

## 2021-12-25 DIAGNOSIS — Z780 Asymptomatic menopausal state: Secondary | ICD-10-CM | POA: Diagnosis present

## 2021-12-25 DIAGNOSIS — Z1231 Encounter for screening mammogram for malignant neoplasm of breast: Secondary | ICD-10-CM | POA: Insufficient documentation

## 2021-12-30 IMAGING — MG DIGITAL SCREENING BILAT W/ TOMO W/ CAD
8 series · 8 of 24 positions shown · non-contrast
Comparison: Previous exam(s).

CLINICAL DATA: Screening.

EXAM:
DIGITAL SCREENING BILATERAL MAMMOGRAM WITH TOMO AND CAD

[L CC synth-2D]
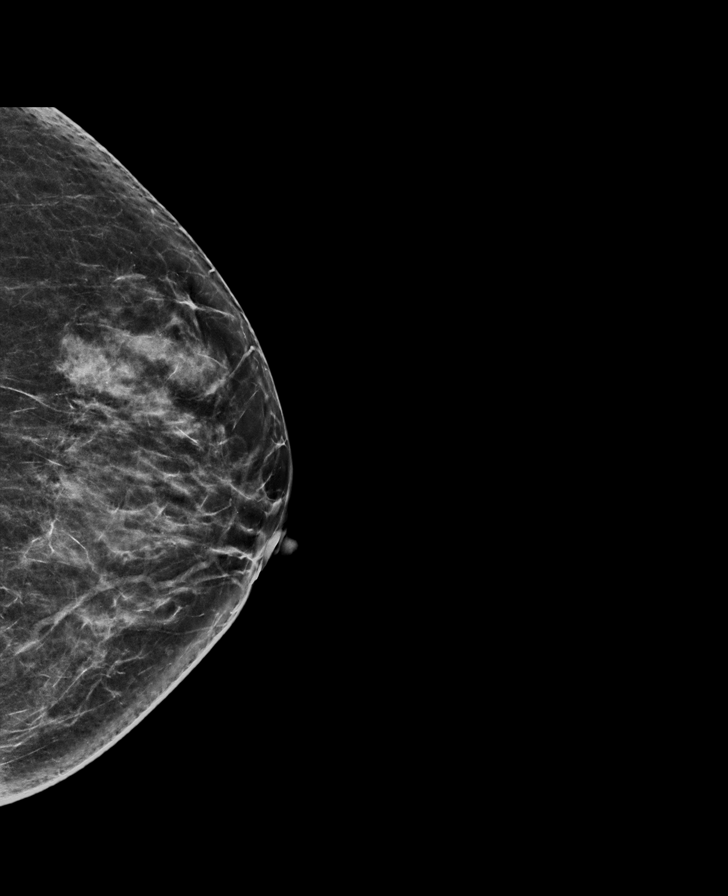

[R MLO synth-2D]
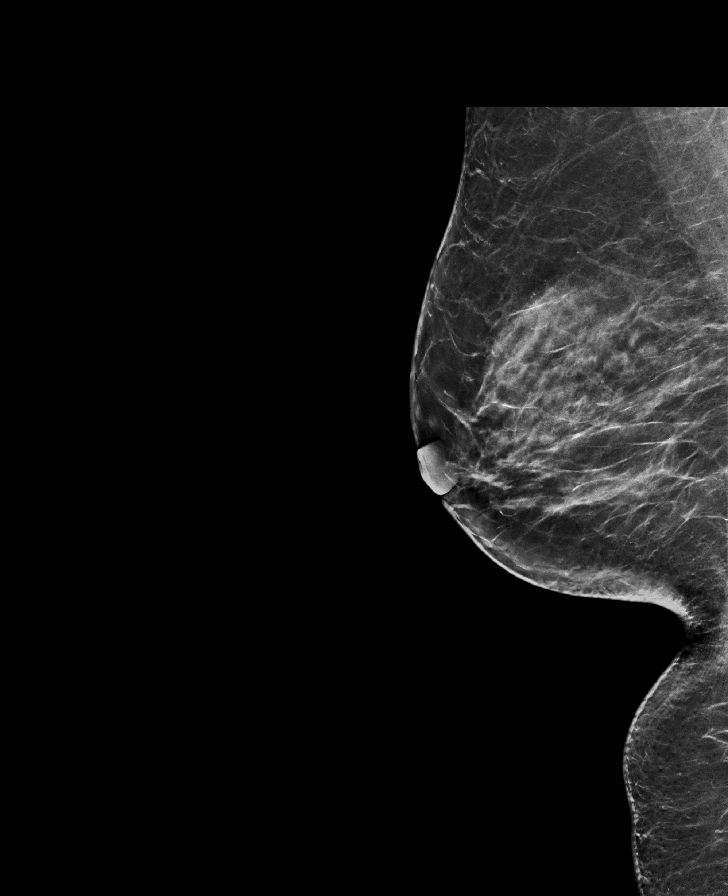

[R CC synth-2D]
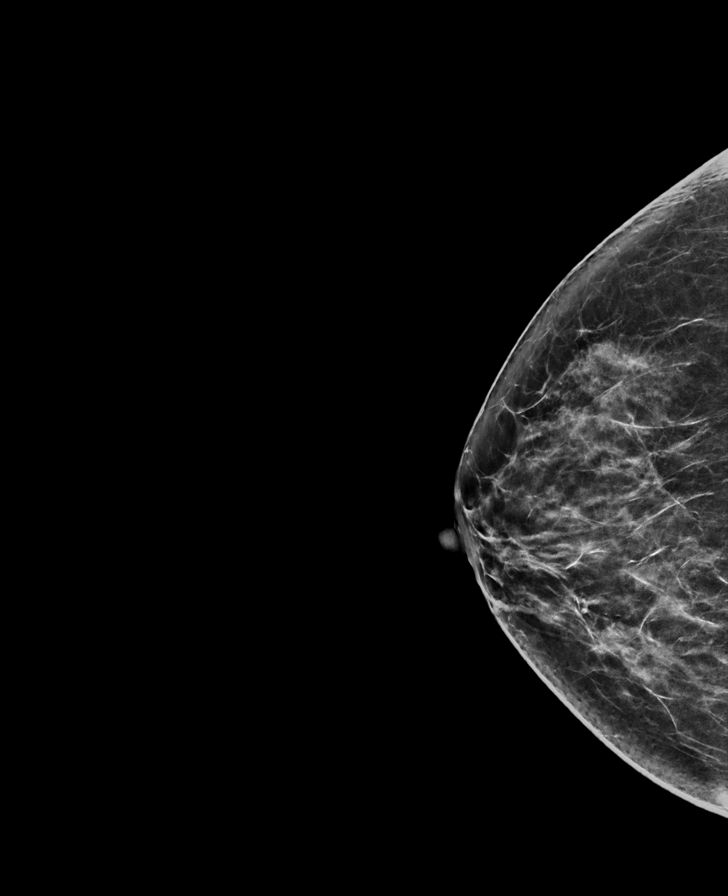

[L MLO synth-2D]
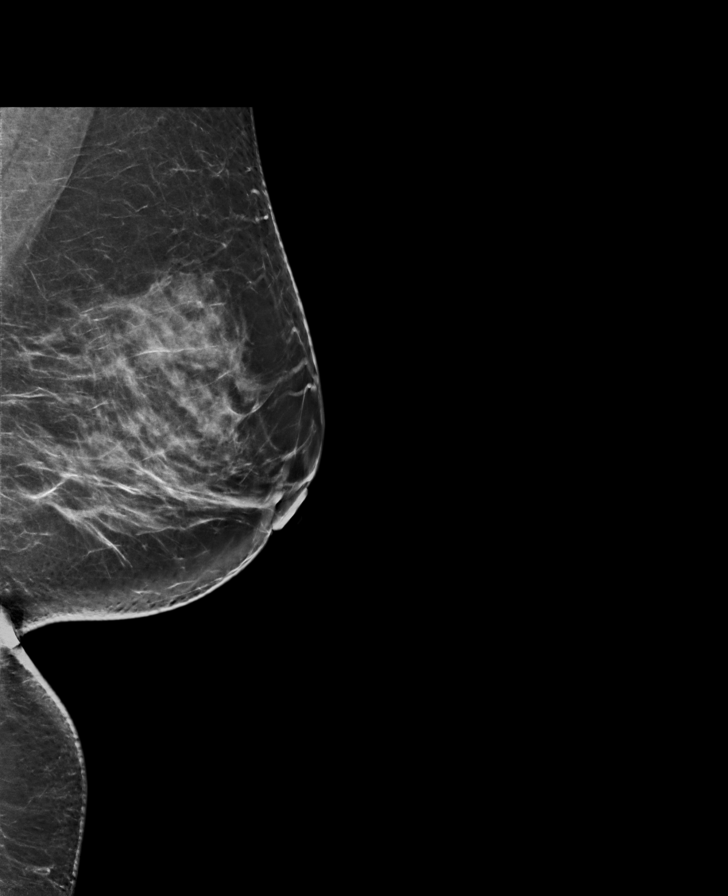

[L MLO tomo · tomo slice 41/82.0]
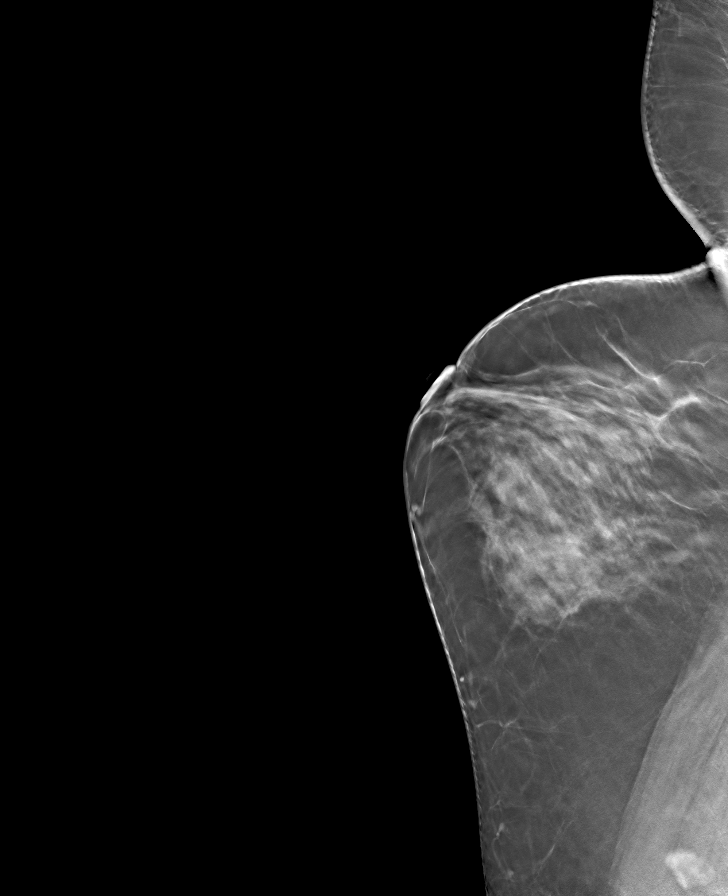

[R CC tomo · tomo slice 33/66.0]
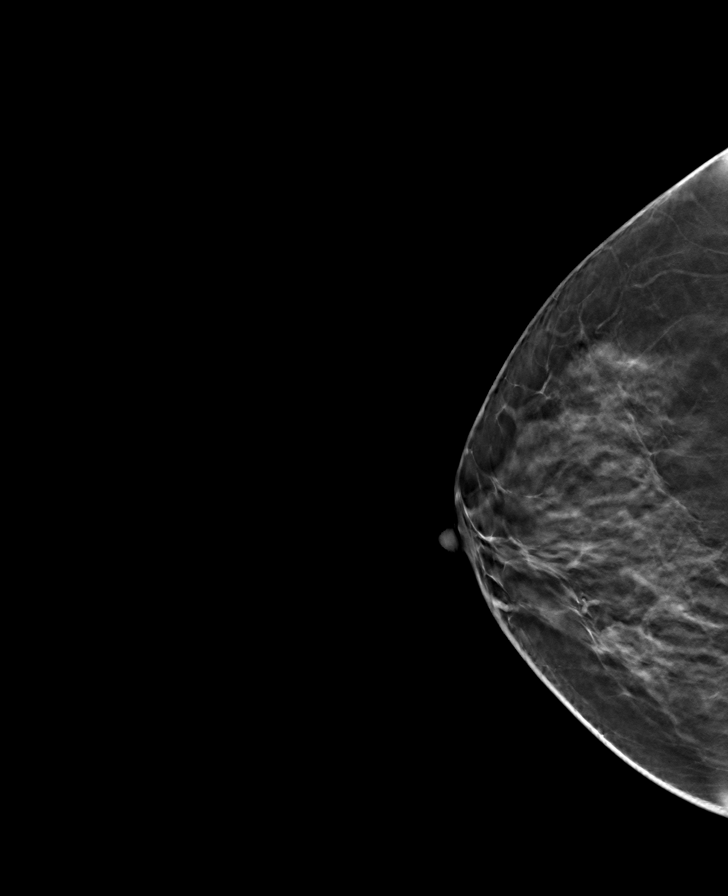

[L CC tomo · tomo slice 35/68.0]
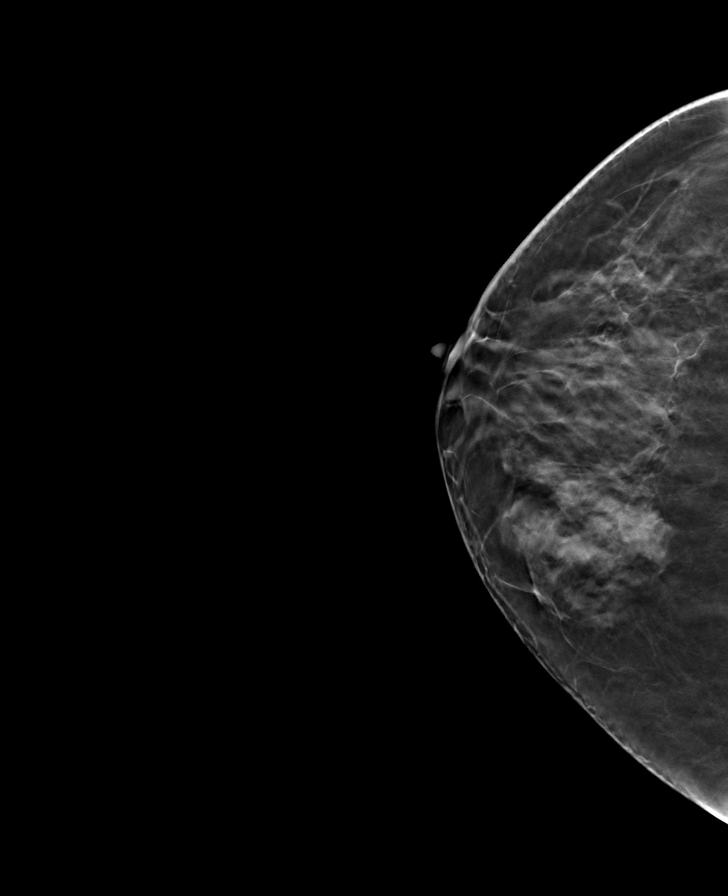

[R MLO tomo · tomo slice 39/78.0]
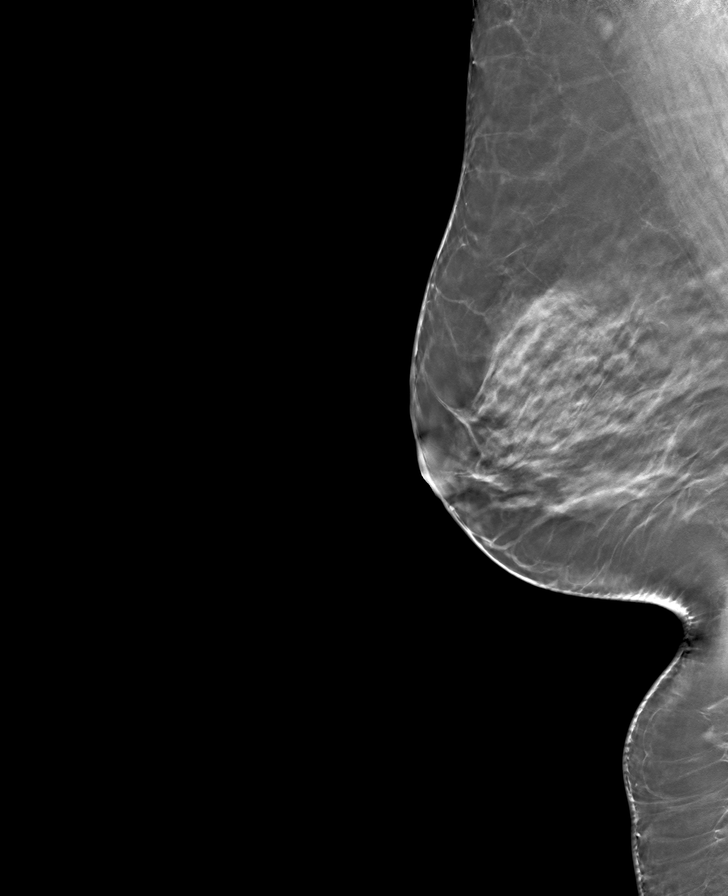

[8 of 24 positions shown; findings below may reference images not displayed]

ACR Breast Density Category c: The breast tissue is heterogeneously
dense, which may obscure small masses.
FINDINGS: There are no findings suspicious for malignancy. Images were
processed with CAD.
IMPRESSION: No mammographic evidence of malignancy. A result letter of this
screening mammogram will be mailed directly to the patient.

RECOMMENDATION:
Screening mammogram in one year. (Code:FT-U-LHB)

BI-RADS CATEGORY  1: Negative.

## 2022-02-15 ENCOUNTER — Telehealth: Payer: Self-pay | Admitting: Internal Medicine

## 2022-02-15 NOTE — Telephone Encounter (Signed)
Lvm tp move 02/25/22 appointment up to 2:15-Toni

## 2022-02-26 ENCOUNTER — Ambulatory Visit: Payer: Managed Care, Other (non HMO) | Admitting: Internal Medicine

## 2022-04-09 ENCOUNTER — Ambulatory Visit: Payer: Managed Care, Other (non HMO) | Admitting: Internal Medicine

## 2022-04-22 ENCOUNTER — Ambulatory Visit: Payer: Managed Care, Other (non HMO) | Admitting: Internal Medicine

## 2022-04-23 ENCOUNTER — Ambulatory Visit: Payer: Managed Care, Other (non HMO) | Admitting: Internal Medicine

## 2022-04-23 ENCOUNTER — Encounter: Payer: Self-pay | Admitting: Internal Medicine

## 2022-04-23 VITALS — BP 128/87 | HR 69 | Temp 98.2°F | Resp 16 | Ht 61.0 in | Wt 178.8 lb

## 2022-04-23 DIAGNOSIS — J301 Allergic rhinitis due to pollen: Secondary | ICD-10-CM

## 2022-04-23 DIAGNOSIS — J4520 Mild intermittent asthma, uncomplicated: Secondary | ICD-10-CM | POA: Diagnosis not present

## 2022-04-23 DIAGNOSIS — R0602 Shortness of breath: Secondary | ICD-10-CM | POA: Diagnosis not present

## 2022-04-23 NOTE — Progress Notes (Signed)
[facility], [place] [address]  Pulmonary Sleep Medicine   Office Visit Note  Patient Name: Beverly Campbell DOB: [birth year] MRN [id number]  Date of Service: 04/23/2022  Complaints/HPI: She has been doing well overall. She states she has had no issues with her breathing. She on occasion will require the usage of allergy meds and inhalers. No cough noted. She states only on occasion cough. Has no chest pain  Office Spirometry Results: Peak Flow: (!) 3 L/min FEV1: 1.84 liters FVC: 2.53 liters FEV1/FVC: 72.7 % FVC  % Predicted: 83 % FEV % Predicted: 77 %   ROS  General: (-) fever, (-) chills, (-) night sweats, (-) weakness Skin: (-) rashes, (-) itching,. Eyes: (-) visual changes, (-) redness, (-) itching. Nose and Sinuses: (-) nasal stuffiness or itchiness, (-) postnasal drip, (-) nosebleeds, (-) sinus trouble. Mouth and Throat: (-) sore throat, (-) hoarseness. Neck: (-) swollen glands, (-) enlarged thyroid, (-) neck pain. Respiratory: + cough, (-) bloody sputum, - shortness of breath, - wheezing. Cardiovascular: - ankle swelling, (-) chest pain. Lymphatic: (-) lymph node enlargement. Neurologic: (-) numbness, (-) tingling. Psychiatric: (-) anxiety, (-) depression   Current Medication: Outpatient Encounter Medications as of 04/23/2022  Medication Sig   albuterol (PROVENTIL) (2.5 MG/3ML) 0.083% nebulizer solution Take 3 mLs (2.5 mg total) by nebulization every 6 (six) hours as needed for wheezing or shortness of breath.   albuterol (VENTOLIN HFA) 108 (90 Base) MCG/ACT inhaler Inhale 2 puffs into the lungs every 6 (six) hours as needed for wheezing or shortness of breath.   amoxicillin-clavulanate (AUGMENTIN) 875-125 MG tablet Take 1 tablet by mouth 2 (two) times daily.   benzonatate (TESSALON) 100 MG capsule Take 1 capsule (100 mg total) by mouth 2 (two) times daily as needed for cough.   budesonide-formoterol (SYMBICORT) 80-4.5  MCG/ACT inhaler Inhale 2 puffs into the lungs 2 (two) times daily.   levothyroxine (SYNTHROID) 100 MCG tablet TAKE 1 TABLET BY MOUTH DAILY   Multiple Vitamins-Iron (MULTIVITAMIN/IRON PO) Take by mouth.   triamcinolone (KENALOG) 0.025 % cream Apply 1 application topically 2 (two) times daily.   No facility-administered encounter medications on file as of 04/23/2022.    Surgical History: Past Surgical History:  Procedure Laterality Date   COLONOSCOPY WITH PROPOFOL N/A 01/01/2016   Procedure: COLONOSCOPY WITH PROPOFOL;  Surgeon: Jonathon Bellows, MD;  Location: [facility] ENDOSCOPY;  Service: Endoscopy;  Laterality: N/A;   ESOPHAGOGASTRODUODENOSCOPY (EGD) WITH PROPOFOL N/A 01/01/2016   Procedure: ESOPHAGOGASTRODUODENOSCOPY (EGD) WITH PROPOFOL;  Surgeon: Jonathon Bellows, MD;  Location: [facility] ENDOSCOPY;  Service: Endoscopy;  Laterality: N/A;   THYROID SURGERY  1997   had thyroid removed   TUBAL LIGATION  1995    Medical History: Past Medical History:  Diagnosis Date   Anemia    Asthma    Hypothyroidism    Thyroid disease     Family History: Family History  Problem Relation Age of Onset   Colon cancer Father    Hypertension Mother    Diabetes Mother    Breast cancer Neg Hx     Social History: Social History   Socioeconomic History   Marital status: Divorced    Spouse name: Not on file   Number of children: Not on file   Years of education: Not on file   Highest education level: Not on file  Occupational History   Not on file  Tobacco Use   Smoking status: Never   Smokeless tobacco: Never  Substance and Sexual Activity  Alcohol use: Yes    Comment: occasionally   Drug use: No   Sexual activity: Yes    Birth control/protection: None  Other Topics Concern   Not on file  Social History Narrative   Not on file   Social Determinants of Health   Financial Resource Strain: Not on file  Food Insecurity: Not on file  Transportation Needs: Not on file  Physical Activity: Not on file   Stress: Not on file  Social Connections: Not on file  Intimate Partner Violence: Not on file    Vital Signs: Blood pressure 128/87, pulse 69, temperature 98.2 F (36.8 C), resp. rate 16, height 5\' 1"  (1.549 m), weight 178 lb 12.8 oz (81.1 kg), last menstrual period 04/20/2019, SpO2 94 %, peak flow (!) 3 L/min, unknown if currently breastfeeding.  Examination: General Appearance: The patient is well-developed, well-nourished, and in no distress. Skin: Gross inspection of skin unremarkable. Head: normocephalic, no gross deformities. Eyes: no gross deformities noted. ENT: ears appear grossly normal no exudates. Neck: Supple. No thyromegaly. No LAD. Respiratory: no rhonchi noted. Cardiovascular: Normal S1 and S2 without murmur or rub. Extremities: No cyanosis. pulses are equal. Neurologic: Alert and oriented. No involuntary movements.  LABS: No results found for this or any previous visit (from the past 2160 hour(s)).  Radiology: MM 3D SCREEN BREAST BILATERAL  Result Date: 12/26/2021 CLINICAL DATA:  Screening. EXAM: DIGITAL SCREENING BILATERAL MAMMOGRAM WITH TOMOSYNTHESIS AND CAD TECHNIQUE: Bilateral screening digital craniocaudal and mediolateral oblique mammograms were obtained. Bilateral screening digital breast tomosynthesis was performed. The images were evaluated with computer-aided detection. COMPARISON:  Previous exam(s). ACR Breast Density Category b: There are scattered areas of fibroglandular density. FINDINGS: There are no findings suspicious for malignancy. IMPRESSION: No mammographic evidence of malignancy. A result letter of this screening mammogram will be mailed directly to the patient. RECOMMENDATION: Screening mammogram in one year. (Code:SM-B-01Y) BI-RADS CATEGORY  1: Negative. Electronically Signed   By: Lillia Mountain M.D.   On: 12/26/2021 14:23   DG Bone Density  Result Date: 12/25/2021 EXAM: DUAL X-RAY ABSORPTIOMETRY (DXA) FOR BONE MINERAL DENSITY IMPRESSION: Your  patient Beverly Campbell completed a BMD test on 12/25/2021 using the [place] (analysis version: 14.10) manufactured by [employer]. The following summarizes the results of our evaluation. Technologist: LCE PATIENT BIOGRAPHICAL: Name: Campbell, Beverly Patient ID: [id number] Birth Date: 1965/05/23 Height: 61.0 in. Gender: Female Exam Date: 12/25/2021 Weight: 179.8 lbs. Indications: POSTmenopausal Fractures:             Treatments: ASSESSMENT: The BMD measured at Femur Neck Left is 0.918 g/cm2 with a T-score of -0.9. This patient is considered normal according to [facility]) criteria. The scan quality is good. Site Region Measured Measured WHO Young Adult BMD Date       Age      Classification T-score AP Spine L1-L2 12/25/2021 56.3 Normal 0.2 1.183 g/cm2 DualFemur Neck Left 12/25/2021 56.3 Normal -0.9 0.918 g/cm2 World Health Organization [facility]) criteria for post-menopausal, Caucasian Women: Normal:       T-score at or above -1 SD Osteopenia:   T-score between -1 and -2.5 SD Osteoporosis: T-score at or below -2.5 SD RECOMMENDATIONS: 1. All patients should optimize calcium and vitamin D intake. 2. Consider FDA-approved medical therapies in postmenopausal women and men aged 33 years and older, based on the following: a. A hip or vertebral (clinical or morphometric) fracture b. T-score < -2.5 at the femoral neck or spine after appropriate evaluation to exclude  secondary causes c. Low bone mass (T-score between -1.0 and -2.5 at the femoral neck or spine) and a 10-year probability of a hip fracture > 3% or a 10-year probability of a major osteoporosis-related fracture > 20% based on the [place]-adapted WHO algorithm d. Clinician judgment and/or patient preferences may indicate treatment for people with 10-year fracture probabilities above or below these levels FOLLOW-UP: People with diagnosed cases of osteoporosis or at high risk for fracture should have regular  bone mineral density tests. For patients eligible for Medicare, routine testing is allowed once every 2 years. The testing frequency can be increased to one year for patients who have rapidly progressing disease, those who are receiving or discontinuing medical therapy to restore bone mass, or have additional risk factors. I have reviewed this report, and agree with the above findings. [facility] Radiology Electronically Signed   By: Ammie Ferrier M.D.   On: 12/25/2021 15:32    No results found.  No results found.  Assessment and Plan: Patient Active Problem List   Diagnosis Date Noted   Mild intermittent asthma without complication [id number]   Atopic dermatitis 12/04/2019   Body mass index (BMI) of 33.0-33.9 in adult 12/04/2019   Encounter for general adult medical examination with abnormal findings 05/23/2019   Shortness of breath 05/23/2019   Routine cervical smear 05/23/2019   Other fatigue 05/07/2018   Wheezing 05/07/2018   Respiratory infection 04/27/2018   Cough 04/27/2018   Screening for breast cancer 10/19/2017   Ganglion cyst of finger 10/19/2017   Acquired hypothyroidism 10/19/2017   Vitamin D deficiency 10/19/2017   Dysuria 10/19/2017   Internal hemorrhoids    Diverticulosis of large intestine without diverticulitis    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 Shortness of breath Intermittent when asthma flares now at baseline - Spirometry with Graph  2. Mild intermittent asthma without complication She will continue with her current asthma regimen  3. Seasonal allergic rhinitis due to pollen Antihistamines as tolerated. She also states she has sinus congestion. I will get a CT sinus due to history of asthma and also history of allergies and sinusitis in the past  General  Counseling: I have discussed the findings of the evaluation and examination with Jourdyn.  I have also discussed any further diagnostic evaluation thatmay be needed or ordered today. Gwenette verbalizes understanding of the findings of todays visit. We also reviewed her medications today and discussed drug interactions and side effects including but not limited excessive drowsiness and altered mental states. We also discussed that there is always a risk not just to her but also people around her. she has been encouraged to call the office with any questions or concerns that should arise related to todays visit.  Orders Placed This Encounter  Procedures   Spirometry with Graph    Order Specific Question:   Where should this test be performed?    Answer:   [facility]     Time spent: 21  I have personally obtained a history, examined the patient, evaluated laboratory and imaging results, formulated the assessment and plan and placed orders.    Allyne Gee, MD [facility] Pulmonary and Critical Care Sleep medicine

## 2022-05-09 ENCOUNTER — Encounter: Payer: Managed Care, Other (non HMO) | Admitting: Nurse Practitioner

## 2022-06-02 ENCOUNTER — Other Ambulatory Visit: Payer: Self-pay | Admitting: Nurse Practitioner

## 2022-06-03 ENCOUNTER — Encounter: Payer: Self-pay | Admitting: Nurse Practitioner

## 2022-06-03 ENCOUNTER — Ambulatory Visit (INDEPENDENT_AMBULATORY_CARE_PROVIDER_SITE_OTHER): Payer: Managed Care, Other (non HMO) | Admitting: Nurse Practitioner

## 2022-06-03 VITALS — BP 130/82 | HR 68 | Temp 97.3°F | Resp 16 | Ht 61.0 in | Wt 175.6 lb

## 2022-06-03 DIAGNOSIS — K760 Fatty (change of) liver, not elsewhere classified: Secondary | ICD-10-CM | POA: Diagnosis not present

## 2022-06-03 DIAGNOSIS — Z0001 Encounter for general adult medical examination with abnormal findings: Secondary | ICD-10-CM | POA: Diagnosis not present

## 2022-06-03 DIAGNOSIS — E782 Mixed hyperlipidemia: Secondary | ICD-10-CM

## 2022-06-03 DIAGNOSIS — Q279 Congenital malformation of peripheral vascular system, unspecified: Secondary | ICD-10-CM | POA: Diagnosis not present

## 2022-06-03 DIAGNOSIS — D509 Iron deficiency anemia, unspecified: Secondary | ICD-10-CM | POA: Diagnosis not present

## 2022-06-03 DIAGNOSIS — E039 Hypothyroidism, unspecified: Secondary | ICD-10-CM

## 2022-06-03 DIAGNOSIS — E559 Vitamin D deficiency, unspecified: Secondary | ICD-10-CM

## 2022-06-03 DIAGNOSIS — E538 Deficiency of other specified B group vitamins: Secondary | ICD-10-CM

## 2022-06-03 DIAGNOSIS — R300 Dysuria: Secondary | ICD-10-CM

## 2022-06-03 NOTE — Progress Notes (Signed)
[facility] [address] [place], [place] [address]  Internal MEDICINE  Office Visit Note  Patient Name: Beverly Campbell  [id number]  [id number]  Date of Service: 06/03/2022  Chief Complaint  Patient presents with   Annual Exam    HPI Beverly Campbell presents for an annual well visit and physical exam.  Well-appearing 57 y.o. female with mild intermittent asthma, hypothyroidism, vitamin D deficiency, and a history of anemia.  Routine CRC screening: due in 2027 Routine mammogram: done in December 2023 Pap smear: done in 2023 due for routine labs  New or worsening pain:none  Has a lump on the base of the 4th finger of the right hand, wants to wait until the later part of the year to get it surgically removed.  Blood pressure and other vital signs remain within normal limits. She has had not significant changes in her home life, diet or lifestyle.    Current Medication: Outpatient Encounter Medications as of 06/03/2022  Medication Sig   albuterol (PROVENTIL) (2.5 MG/3ML) 0.083% nebulizer solution Take 3 mLs (2.5 mg total) by nebulization every 6 (six) hours as needed for wheezing or shortness of breath.   albuterol (VENTOLIN HFA) 108 (90 Base) MCG/ACT inhaler Inhale 2 puffs into the lungs every 6 (six) hours as needed for wheezing or shortness of breath.   amoxicillin-clavulanate (AUGMENTIN) 875-125 MG tablet Take 1 tablet by mouth 2 (two) times daily.   benzonatate (TESSALON) 100 MG capsule Take 1 capsule (100 mg total) by mouth 2 (two) times daily as needed for cough.   budesonide-formoterol (SYMBICORT) 80-4.5 MCG/ACT inhaler Inhale 2 puffs into the lungs 2 (two) times daily.   levothyroxine (SYNTHROID) 100 MCG tablet TAKE 1 TABLET BY MOUTH DAILY   Multiple Vitamins-Iron (MULTIVITAMIN/IRON PO) Take by mouth.   triamcinolone (KENALOG) 0.025 % cream Apply 1 application topically 2 (two) times daily.   No facility-administered encounter medications on file as of  06/03/2022.    Surgical History: Past Surgical History:  Procedure Laterality Date   COLONOSCOPY WITH PROPOFOL N/A 01/01/2016   Procedure: COLONOSCOPY WITH PROPOFOL;  Surgeon: Wyline Mood, MD;  Location: [facility] ENDOSCOPY;  Service: Endoscopy;  Laterality: N/A;   ESOPHAGOGASTRODUODENOSCOPY (EGD) WITH PROPOFOL N/A 01/01/2016   Procedure: ESOPHAGOGASTRODUODENOSCOPY (EGD) WITH PROPOFOL;  Surgeon: Wyline Mood, MD;  Location: [facility] ENDOSCOPY;  Service: Endoscopy;  Laterality: N/A;   THYROID SURGERY  1997   had thyroid removed   TUBAL LIGATION  1995    Medical History: Past Medical History:  Diagnosis Date   Anemia    Asthma    Hypothyroidism    Thyroid disease     Family History: Family History  Problem Relation Age of Onset   Colon cancer Father    Hypertension Mother    Diabetes Mother    Breast cancer Neg Hx     Social History   Socioeconomic History   Marital status: Divorced    Spouse name: Not on file   Number of children: Not on file   Years of education: Not on file   Highest education level: Not on file  Occupational History   Not on file  Tobacco Use   Smoking status: Never   Smokeless tobacco: Never  Substance and Sexual Activity   Alcohol use: Yes    Comment: occasionally   Drug use: No   Sexual activity: Yes    Birth control/protection: None  Other Topics Concern   Not on file  Social History Narrative   Not on file  Social Determinants of Health   Financial Resource Strain: Not on file  Food Insecurity: Not on file  Transportation Needs: Not on file  Physical Activity: Not on file  Stress: Not on file  Social Connections: Not on file  Intimate Partner Violence: Not on file      Review of Systems  Constitutional:  Negative for activity change, appetite change, chills, fatigue, fever and unexpected weight change.  HENT: Negative.  Negative for congestion, ear pain, rhinorrhea, sore throat and trouble swallowing.   Eyes: Negative.    Respiratory: Negative.  Negative for cough, chest tightness, shortness of breath and wheezing.   Cardiovascular: Negative.  Negative for chest pain.  Gastrointestinal: Negative.  Negative for abdominal pain, blood in stool, constipation, diarrhea, nausea and vomiting.  Endocrine: Negative.   Genitourinary: Negative.  Negative for difficulty urinating, dysuria, frequency, hematuria and urgency.  Musculoskeletal: Negative.  Negative for arthralgias, back pain, joint swelling, myalgias and neck pain.  Skin: Negative.  Negative for rash and wound.  Allergic/Immunologic: Negative.  Negative for immunocompromised state.  Neurological: Negative.  Negative for dizziness, seizures, numbness and headaches.  Hematological: Negative.   Psychiatric/Behavioral: Negative.  Negative for behavioral problems, self-injury and suicidal ideas. The patient is not nervous/anxious.     Vital Signs: BP 130/82   Pulse 68   Temp (!) 97.3 F (36.3 C)   Resp 16   Ht 5\' 1"  (1.549 m)   Wt 175 lb 9.6 oz (79.7 kg)   LMP 04/20/2019   SpO2 95%   BMI 33.18 kg/m    Physical Exam Vitals reviewed.  Constitutional:      General: She is awake. She is not in acute distress.    Appearance: Normal appearance. She is well-developed and well-groomed. She is not ill-appearing or diaphoretic.  HENT:     Head: Normocephalic and atraumatic.     Right Ear: Tympanic membrane, ear canal and external ear normal.     Left Ear: Tympanic membrane, ear canal and external ear normal.     Nose: Nose normal. No congestion or rhinorrhea.     Mouth/Throat:     Lips: Pink.     Mouth: Mucous membranes are moist.     Pharynx: Oropharynx is clear. Uvula midline. No oropharyngeal exudate or posterior oropharyngeal erythema.  Eyes:     General: Lids are normal. Vision grossly intact. Gaze aligned appropriately. No scleral icterus.       Right eye: No discharge.        Left eye: No discharge.     Extraocular Movements: Extraocular  movements intact.     Conjunctiva/sclera: Conjunctivae normal.     Pupils: Pupils are equal, round, and reactive to light.     Funduscopic exam:    Right eye: Red reflex present.        Left eye: Red reflex present. Neck:     Thyroid: No thyromegaly.     Vascular: No JVD.     Trachea: Trachea and phonation normal. No tracheal deviation.  Cardiovascular:     Rate and Rhythm: Normal rate and regular rhythm.     Pulses: Normal pulses.     Heart sounds: Normal heart sounds, S1 normal and S2 normal. No murmur heard.    No friction rub. No gallop.  Pulmonary:     Effort: Pulmonary effort is normal. No accessory muscle usage or respiratory distress.     Breath sounds: Normal breath sounds and air entry. No stridor. No wheezing or rales.  Chest:  Chest wall: No tenderness.     Comments: Declined clinical breast exam, gets routine mammograms.  Abdominal:     General: Bowel sounds are normal. There is no distension.     Palpations: Abdomen is soft. There is no shifting dullness, fluid wave, mass or pulsatile mass.     Tenderness: There is no abdominal tenderness. There is no guarding or rebound.  Musculoskeletal:        General: No tenderness. Normal range of motion.     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     Cervical back: Normal range of motion and neck supple.     Right lower leg: No edema.     Left lower leg: No edema.  Lymphadenopathy:     Cervical: No cervical adenopathy.  Skin:    General: Skin is warm and dry.     Capillary Refill: Capillary refill takes less than 2 seconds.     Coloration: Skin is not pale.     Findings: No erythema or rash.  Neurological:     Mental Status: She is alert and oriented to person, place, and time.     Cranial Nerves: No cranial nerve deficit.     Motor: No abnormal muscle tone.     Coordination: Coordination normal.     Deep Tendon Reflexes: Reflexes are normal and symmetric.   Psychiatric:        Mood and Affect: Mood normal.        Behavior: Behavior normal. Behavior is cooperative.        Thought Content: Thought content normal.        Judgment: Judgment normal.        Assessment/Plan: 1. Encounter for routine adult health examination with abnormal findings Age-appropriate preventive screenings and vaccinations discussed, annual physical exam completed. Routine labs for health maintenance ordered, see below. PHM updated.  - CBC with Differential/Platelet - CMP14+EGFR - Lipid Profile - TSH + free T4 - Vitamin D (25 hydroxy) - Iron, TIBC and Ferritin Panel - B12 and Folate Panel  2. Nonalcoholic fatty liver disease without nonalcoholic steatohepatitis (NASH) Routine labs ordered  - CBC with Differential/Platelet - CMP14+EGFR - TSH + free T4  3. Iron deficiency anemia, unspecified iron deficiency anemia type Routine labs ordered  - CBC with Differential/Platelet - Iron, TIBC and Ferritin Panel - B12 and Folate Panel  4. Acquired hypothyroidism Routine labs ordered  - Lipid Profile - TSH + free T4  5. Congenital vascular malformation Referred to orthopedic surgery specifically Dr. Mathis Bud who is a hand specialist. Patient was to meet with her and then plan to do the surgery in the later part of the year to remove the malformation.  - Ambulatory referral to Orthopedic Surgery  6. Mixed hyperlipidemia Routine lab ordered - Lipid Profile  7. Vitamin D deficiency Routine labs ordered  - Vitamin D (25 hydroxy)  8. B12 deficiency Routine labs ordered  - CBC with Differential/Platelet - Iron, TIBC and Ferritin Panel - B12 and Folate Panel  9. Dysuria Routine urinalysis done  - UA/M w/rflx Culture, Routine      General Counseling: Hansika verbalizes understanding of the findings of todays visit and agrees with plan of treatment. I have discussed any further diagnostic evaluation that may be needed or ordered today. We  also reviewed her medications today. she has been encouraged to call the office with any questions or concerns that should arise related to todays visit.  Orders Placed This Encounter  Procedures   CBC with Differential/Platelet   CMP14+EGFR   Lipid Profile   TSH + free T4   Vitamin D (25 hydroxy)   Iron, TIBC and Ferritin Panel   B12 and Folate Panel   UA/M w/rflx Culture, Routine   Ambulatory referral to Orthopedic Surgery    No orders of the defined types were placed in this encounter.   Return in about 6 months (around 12/04/2022) for F/U, [facility]e PCP.   Total time spent:30 Minutes Time spent includes review of chart, medications, test results, and follow up plan with the patient.   [place] Controlled Substance Database was reviewed by me.  This patient was seen by Sallyanne Kuster, FNP-C in collaboration with Dr. Beverely Risen as a part of collaborative care agreement.  Brogan Martis R. Tedd Sias, MSN, FNP-C Internal medicine

## 2022-06-04 ENCOUNTER — Telehealth: Payer: Self-pay | Admitting: Internal Medicine

## 2022-06-04 LAB — UA/M W/RFLX CULTURE, ROUTINE
Bilirubin, UA: NEGATIVE
Glucose, UA: NEGATIVE
Ketones, UA: NEGATIVE
Leukocytes,UA: NEGATIVE
Nitrite, UA: NEGATIVE
Protein,UA: NEGATIVE
RBC, UA: NEGATIVE
Specific Gravity, UA: 1.02 (ref 1.005–1.030)
Urobilinogen, Ur: 0.2 mg/dL (ref 0.2–1.0)
pH, UA: 5.5 (ref 5.0–7.5)

## 2022-06-04 LAB — MICROSCOPIC EXAMINATION
Bacteria, UA: NONE SEEN
Casts: NONE SEEN /lpf
Epithelial Cells (non renal): NONE SEEN /hpf (ref 0–10)
WBC, UA: NONE SEEN /hpf (ref 0–5)

## 2022-06-04 NOTE — Telephone Encounter (Signed)
Orthopedic referral sent via [employer] to Dr. Murtis Sink

## 2022-06-07 ENCOUNTER — Telehealth: Payer: Self-pay | Admitting: Nurse Practitioner

## 2022-06-07 NOTE — Telephone Encounter (Signed)
Orthopedic appointment>> 07/09/2022 with Dr. Stephenie Acres @ [facility]

## 2022-06-21 LAB — CBC WITH DIFFERENTIAL/PLATELET
Basophils Absolute: 0.1 10*3/uL (ref 0.0–0.2)
Basos: 3 %
EOS (ABSOLUTE): 0.4 10*3/uL (ref 0.0–0.4)
Eos: 11 %
Hematocrit: 36.7 % (ref 34.0–46.6)
Hemoglobin: 11.5 g/dL (ref 11.1–15.9)
Immature Grans (Abs): 0 10*3/uL (ref 0.0–0.1)
Immature Granulocytes: 0 %
Lymphocytes Absolute: 1.4 10*3/uL (ref 0.7–3.1)
Lymphs: 36 %
MCH: 19.5 pg — ABNORMAL LOW (ref 26.6–33.0)
MCHC: 31.3 g/dL — ABNORMAL LOW (ref 31.5–35.7)
MCV: 62 fL — ABNORMAL LOW (ref 79–97)
Monocytes Absolute: 0.3 10*3/uL (ref 0.1–0.9)
Monocytes: 7 %
Neutrophils Absolute: 1.6 10*3/uL (ref 1.4–7.0)
Neutrophils: 43 %
Platelets: 202 10*3/uL (ref 150–450)
RBC: 5.91 x10E6/uL — ABNORMAL HIGH (ref 3.77–5.28)
RDW: 17.6 % — ABNORMAL HIGH (ref 11.7–15.4)
WBC: 3.8 10*3/uL (ref 3.4–10.8)

## 2022-06-21 LAB — VITAMIN D 25 HYDROXY (VIT D DEFICIENCY, FRACTURES): Vit D, 25-Hydroxy: 23.1 ng/mL — ABNORMAL LOW (ref 30.0–100.0)

## 2022-06-21 LAB — B12 AND FOLATE PANEL
Folate: 13.2 ng/mL (ref >3.0)
Vitamin B-12: 530 pg/mL (ref 232–1245)

## 2022-06-21 LAB — LIPID PANEL
Chol/HDL Ratio: 2.7 ratio (ref 0.0–4.4)
Cholesterol, Total: 217 mg/dL — ABNORMAL HIGH (ref 100–199)
HDL: 80 mg/dL (ref >39)
LDL Chol Calc (NIH): 124 mg/dL — ABNORMAL HIGH (ref 0–99)
Triglycerides: 74 mg/dL (ref 0–149)
VLDL Cholesterol Cal: 13 mg/dL (ref 5–40)

## 2022-06-21 LAB — CMP14+EGFR
ALT: 16 IU/L (ref 0–32)
AST: 13 IU/L (ref 0–40)
Albumin/Globulin Ratio: 2 (ref 1.2–2.2)
Albumin: 4.5 g/dL (ref 3.8–4.9)
Alkaline Phosphatase: 86 IU/L (ref 44–121)
BUN/Creatinine Ratio: 20 (ref 9–23)
BUN: 17 mg/dL (ref 6–24)
Bilirubin Total: 0.9 mg/dL (ref 0.0–1.2)
CO2: 20 mmol/L (ref 20–29)
Calcium: 9.7 mg/dL (ref 8.7–10.2)
Chloride: 107 mmol/L — ABNORMAL HIGH (ref 96–106)
Creatinine, Ser: 0.85 mg/dL (ref 0.57–1.00)
Globulin, Total: 2.2 g/dL (ref 1.5–4.5)
Glucose: 97 mg/dL (ref 70–99)
Potassium: 4.8 mmol/L (ref 3.5–5.2)
Sodium: 144 mmol/L (ref 134–144)
Total Protein: 6.7 g/dL (ref 6.0–8.5)
eGFR: 80 mL/min/{1.73_m2} (ref >59)

## 2022-06-21 LAB — IRON,TIBC AND FERRITIN PANEL
Ferritin: 308 ng/mL — ABNORMAL HIGH (ref 15–150)
Iron Saturation: 39 % (ref 15–55)
Iron: 111 ug/dL (ref 27–159)
Total Iron Binding Capacity: 286 ug/dL (ref 250–450)
UIBC: 175 ug/dL (ref 131–425)

## 2022-06-21 LAB — TSH+FREE T4
Free T4: 1.54 ng/dL (ref 0.82–1.77)
TSH: 0.817 u[IU]/mL (ref 0.450–4.500)

## 2022-07-22 NOTE — Progress Notes (Signed)
--  no significant change in CBC, sees hematology as needed.  --CMP is normal --LDL is high at 124 -- limit red meat intake, increase lean proteins and add OTC fish oil or flaxseed oil supplement.  Low vitamin D at 23.1 -- take OTC supplement 2000 - 5000 units daily.  Ferritin is still high -- no intervention, continue to monitor Thyroid, B12 and folate are normal.

## 2022-07-29 ENCOUNTER — Telehealth: Payer: Self-pay

## 2022-07-29 NOTE — Telephone Encounter (Signed)
Patient notified

## 2022-07-29 NOTE — Telephone Encounter (Signed)
-----   Message from Sallyanne Kuster, NP sent at 07/22/2022  8:23 AM EDT ----- --no significant change in CBC, sees hematology as needed.  --CMP is normal --LDL is high at 124 -- limit red meat intake, increase lean proteins and add OTC fish oil or flaxseed oil supplement.  Low vitamin D at 23.1 -- take OTC supplement 2000 - 5000 units daily.  Ferritin is still high -- no intervention, continue to monitor Thyroid, B12 and folate are normal.

## 2022-10-21 ENCOUNTER — Ambulatory Visit (INDEPENDENT_AMBULATORY_CARE_PROVIDER_SITE_OTHER): Payer: Managed Care, Other (non HMO) | Admitting: Internal Medicine

## 2022-10-21 ENCOUNTER — Encounter: Payer: Self-pay | Admitting: Internal Medicine

## 2022-10-21 VITALS — BP 125/85 | HR 75 | Temp 98.3°F | Resp 16 | Ht 60.0 in | Wt 176.0 lb

## 2022-10-21 DIAGNOSIS — J4520 Mild intermittent asthma, uncomplicated: Secondary | ICD-10-CM

## 2022-10-21 DIAGNOSIS — K760 Fatty (change of) liver, not elsewhere classified: Secondary | ICD-10-CM

## 2022-10-21 NOTE — Patient Instructions (Signed)
Asthma, Adult  Asthma is a condition that causes swelling and narrowing of the airways. These are the passages that lead from the nose and mouth down into the lungs. When asthma symptoms get worse it is called an asthma attack or flare. This can make it hard to breathe. Asthma flares can range from minor to life-threatening. There is no cure for asthma, but medicines and lifestyle changes can help to control it. What are the causes? It is not known exactly what causes asthma, but certain things can cause asthma symptoms to get worse (triggers). What can trigger an asthma attack? Cigarette smoke. Mold. Dust. Your pet's skin flakes (dander). Cockroaches. Pollen. Air pollution (like household cleaners, wood smoke, smog, or Therapist, occupational). What are the signs or symptoms? Trouble breathing (shortness of breath). Coughing. Making high-pitched whistling sounds when you breathe, most often when you breathe out (wheezing). Chest tightness. Tiredness with little activity. Poor exercise tolerance. How is this treated? Controller medicines that help prevent asthma symptoms. Fast-acting reliever or rescue medicines. These give short-term relief of asthma symptoms. Allergy medicines if your attacks are brought on by allergens. Medicines to help control the body's defense (immune) system. Staying away from the things that cause asthma attacks. Follow these instructions at home: Avoiding triggers in your home Do not allow anyone to smoke in your home. Limit use of fireplaces and wood stoves. Get rid of pests (such as roaches and mice) and their droppings. Keep your home clean. Clean your floors. Dust regularly. Use cleaning products that do not smell. Wash bed sheets and blankets every week in hot water. Dry them in a dryer. Have someone vacuum when you are not home. Change your heating and air conditioning filters often. Use blankets that are made of polyester or cotton. General  instructions Take over-the-counter and prescription medicines only as told by your doctor. Do not smoke or use any products that contain nicotine or tobacco. If you need help quitting, ask your doctor. Stay away from secondhand smoke. Avoid doing things outdoors when allergen counts are high and when air quality is low. Warm up before you exercise. Take time to cool down after exercise. Use a peak flow meter as told by your doctor. A peak flow meter is a tool that measures how well your lungs are working. Keep track of the peak flow meter's readings. Write them down. Follow your asthma action plan. This is a written plan for taking care of your asthma and treating your attacks. Make sure you get all the shots (vaccines) that your doctor recommends. Ask your doctor about a flu shot and a pneumonia shot. Keep all follow-up visits. Contact a doctor if: You have wheezing, shortness of breath, or a cough even while taking medicine to prevent attacks. The mucus you cough up (sputum) is thicker than usual. The mucus you cough up changes from clear or white to yellow, green, gray, or is bloody. You have problems from the medicine you are taking, such as: A rash. Itching. Swelling. Trouble breathing. You need reliever medicines more than 2-3 times a week. Your peak flow reading is still at 50-79% of your personal best after following the action plan for 1 hour. You have a fever. Get help right away if: You seem to be worse and are not responding to medicine during an asthma attack. You are short of breath even at rest. You get short of breath when doing very little activity. You have trouble eating, drinking, or talking. You have chest  pain or tightness. You have a fast heartbeat. Your lips or fingernails start to turn blue. You are light-headed or dizzy, or you faint. Your peak flow is less than 50% of your personal best. You feel too tired to breathe normally. These symptoms may be an  emergency. Get help right away. Call 911. Do not wait to see if the symptoms will go away. Do not drive yourself to the hospital. Summary Asthma is a long-term (chronic) condition in which the airways get tight and narrow. An asthma attack can make it hard to breathe. Asthma cannot be cured, but medicines and lifestyle changes can help control it. Make sure you understand how to avoid triggers and how and when to use your medicines. Avoid things that can cause allergy symptoms (allergens). These include animal skin flakes (dander) and pollen from trees or grass. Avoid things that pollute the air. These may include household cleaners, wood smoke, smog, or chemical odors. This information is not intended to replace advice given to you by your health care provider. Make sure you discuss any questions you have with your health care provider. Document Revised: 10/16/2020 Document Reviewed: 10/16/2020 Elsevier Patient Education  2024 [employer].

## 2022-10-21 NOTE — Progress Notes (Signed)
[facility] [address] [place], [place] [address]  Pulmonary Sleep Medicine   Office Visit Note  Patient Name: Beverly Campbell DOB: 1965/10/25 MRN [id number]  Date of Service: 10/21/2022  Complaints/HPI: She is doing well no flare ups. She has not had much wheeze noted.  She denies any cough or chest congestion.  No shortness of breath with any exertion.  Denies having any nausea vomiting no fevers are noted.  Overall doing well with current medical management.  Office Spirometry Results:     ROS  General: (-) fever, (-) chills, (-) night sweats, (-) weakness Skin: (-) rashes, (-) itching,. Eyes: (-) visual changes, (-) redness, (-) itching. Nose and Sinuses: (-) nasal stuffiness or itchiness, (-) postnasal drip, (-) nosebleeds, (-) sinus trouble. Mouth and Throat: (-) sore throat, (-) hoarseness. Neck: (-) swollen glands, (-) enlarged thyroid, (-) neck pain. Respiratory: - cough, (-) bloody sputum, - shortness of breath, - wheezing. Cardiovascular: - ankle swelling, (-) chest pain. Lymphatic: (-) lymph node enlargement. Neurologic: (-) numbness, (-) tingling. Psychiatric: (-) anxiety, (-) depression   Current Medication: Outpatient Encounter Medications as of 10/21/2022  Medication Sig   albuterol (PROVENTIL) (2.5 MG/3ML) 0.083% nebulizer solution Take 3 mLs (2.5 mg total) by nebulization every 6 (six) hours as needed for wheezing or shortness of breath.   albuterol (VENTOLIN HFA) 108 (90 Base) MCG/ACT inhaler Inhale 2 puffs into the lungs every 6 (six) hours as needed for wheezing or shortness of breath.   amoxicillin-clavulanate (AUGMENTIN) 875-125 MG tablet Take 1 tablet by mouth 2 (two) times daily.   benzonatate (TESSALON) 100 MG capsule Take 1 capsule (100 mg total) by mouth 2 (two) times daily as needed for cough.   budesonide-formoterol (SYMBICORT) 80-4.5 MCG/ACT inhaler Inhale 2 puffs into the lungs 2 (two) times daily.   levothyroxine  (SYNTHROID) 100 MCG tablet TAKE 1 TABLET BY MOUTH DAILY   Multiple Vitamins-Iron (MULTIVITAMIN/IRON PO) Take by mouth.   triamcinolone (KENALOG) 0.025 % cream Apply 1 application topically 2 (two) times daily.   No facility-administered encounter medications on file as of 10/21/2022.    Surgical History: Past Surgical History:  Procedure Laterality Date   COLONOSCOPY WITH PROPOFOL N/A 01/01/2016   Procedure: COLONOSCOPY WITH PROPOFOL;  Surgeon: Wyline Mood, MD;  Location: [facility] ENDOSCOPY;  Service: Endoscopy;  Laterality: N/A;   ESOPHAGOGASTRODUODENOSCOPY (EGD) WITH PROPOFOL N/A 01/01/2016   Procedure: ESOPHAGOGASTRODUODENOSCOPY (EGD) WITH PROPOFOL;  Surgeon: Wyline Mood, MD;  Location: [facility] ENDOSCOPY;  Service: Endoscopy;  Laterality: N/A;   THYROID SURGERY  1997   had thyroid removed   TUBAL LIGATION  1995    Medical History: Past Medical History:  Diagnosis Date   Anemia    Asthma    Hypothyroidism    Thyroid disease     Family History: Family History  Problem Relation Age of Onset   Colon cancer Father    Hypertension Mother    Diabetes Mother    Breast cancer Neg Hx     Social History: Social History   Socioeconomic History   Marital status: Divorced    Spouse name: Not on file   Number of children: Not on file   Years of education: Not on file   Highest education level: Not on file  Occupational History   Not on file  Tobacco Use   Smoking status: Never   Smokeless tobacco: Never  Substance and Sexual Activity   Alcohol use: Yes    Comment: occasionally   Drug use: No  Sexual activity: Yes    Birth control/protection: None  Other Topics Concern   Not on file  Social History Narrative   Not on file   Social Determinants of Health   Financial Resource Strain: Not on file  Food Insecurity: Not on file  Transportation Needs: Not on file  Physical Activity: Not on file  Stress: Not on file  Social Connections: Not on file  Intimate Partner Violence:  Not on file    Vital Signs: Blood pressure 125/85, pulse 75, temperature 98.3 F (36.8 C), resp. rate 16, height 5' (1.524 m), weight 176 lb (79.8 kg), last menstrual period 04/20/2019, SpO2 98%, unknown if currently breastfeeding.  Examination: General Appearance: The patient is well-developed, well-nourished, and in no distress. Skin: Gross inspection of skin unremarkable. Head: normocephalic, no gross deformities. Eyes: no gross deformities noted. ENT: ears appear grossly normal no exudates. Neck: Supple. No thyromegaly. No LAD. Respiratory: no rhonchi noted. Cardiovascular: Normal S1 and S2 without murmur or rub. Extremities: No cyanosis. pulses are equal. Neurologic: Alert and oriented. No involuntary movements.  LABS: No results found for this or any previous visit (from the past 2160 hour(s)).  Radiology: MM 3D SCREEN BREAST BILATERAL  Result Date: 12/26/2021 CLINICAL DATA:  Screening. EXAM: DIGITAL SCREENING BILATERAL MAMMOGRAM WITH TOMOSYNTHESIS AND CAD TECHNIQUE: Bilateral screening digital craniocaudal and mediolateral oblique mammograms were obtained. Bilateral screening digital breast tomosynthesis was performed. The images were evaluated with computer-aided detection. COMPARISON:  Previous exam(s). ACR Breast Density Category b: There are scattered areas of fibroglandular density. FINDINGS: There are no findings suspicious for malignancy. IMPRESSION: No mammographic evidence of malignancy. A result letter of this screening mammogram will be mailed directly to the patient. RECOMMENDATION: Screening mammogram in one year. (Code:SM-B-01Y) BI-RADS CATEGORY  1: Negative. Electronically Signed   By: Baird Lyons M.D.   On: 12/26/2021 14:23   DG Bone Density  Result Date: 12/25/2021 EXAM: DUAL X-RAY ABSORPTIOMETRY (DXA) FOR BONE MINERAL DENSITY IMPRESSION: Your patient Beverly Campbell completed a BMD test on 12/25/2021 using the [employer] Advance DXA System (analysis  version: 14.10) manufactured by [employer]. The following summarizes the results of our evaluation. Technologist: LCE PATIENT BIOGRAPHICAL: Name: Beverly Campbell Patient ID: [id number] Birth Date: [birth year] Height: 61.0 in. Gender: Female Exam Date: 12/25/2021 Weight: 179.8 lbs. Indications: POSTmenopausal Fractures:             Treatments: ASSESSMENT: The BMD measured at Femur Neck Left is 0.918 g/cm2 with a T-score of -0.9. This patient is considered normal according to World Health Organization [facility]) criteria. The scan quality is good. Site Region Measured Measured WHO Young Adult BMD Date       Age      Classification T-score AP Spine L1-L2 12/25/2021 56.3 Normal 0.2 1.183 g/cm2 DualFemur Neck Left 12/25/2021 56.3 Normal -0.9 0.918 g/cm2 World Health Organization [facility]) criteria for post-menopausal, Caucasian Women: Normal:       T-score at or above -1 SD Osteopenia:   T-score between -1 and -2.5 SD Osteoporosis: T-score at or below -2.5 SD RECOMMENDATIONS: 1. All patients should optimize calcium and vitamin D intake. 2. Consider FDA-approved medical therapies in postmenopausal women and men aged 36 years and older, based on the following: a. A hip or vertebral (clinical or morphometric) fracture b. T-score < -2.5 at the femoral neck or spine after appropriate evaluation to exclude secondary causes c. Low bone mass (T-score between -1.0 and -2.5 at the femoral neck or spine) and a 10-year probability of a hip  fracture > 3% or a 10-year probability of a major osteoporosis-related fracture > 20% based on the [place]-adapted WHO algorithm d. Clinician judgment and/or patient preferences may indicate treatment for people with 10-year fracture probabilities above or below these levels FOLLOW-UP: People with diagnosed cases of osteoporosis or at high risk for fracture should have regular bone mineral density tests. For patients eligible for Medicare, routine testing is allowed once every 2 years. The testing  frequency can be increased to one year for patients who have rapidly progressing disease, those who are receiving or discontinuing medical therapy to restore bone mass, or have additional risk factors. I have reviewed this report, and agree with the above findings. [facility] Radiology Electronically Signed   By: Frederico Hamman M.D.   On: 12/25/2021 15:32    No results found.  No results found.  Assessment and Plan: Patient Active Problem List   Diagnosis Date Noted   Mild intermittent asthma without complication 12/04/2019   Atopic dermatitis 12/04/2019   Body mass index (BMI) of 33.0-33.9 in adult 12/04/2019   Encounter for general adult medical examination with abnormal findings 05/23/2019   Shortness of breath 05/23/2019   Routine cervical smear 05/23/2019   Other fatigue 05/07/2018   Wheezing 05/07/2018   Respiratory infection 04/27/2018   Cough 04/27/2018   Screening for breast cancer 10/19/2017   Ganglion cyst of finger 10/19/2017   Acquired hypothyroidism 10/19/2017   Vitamin D deficiency 10/19/2017   Dysuria 10/19/2017   Internal hemorrhoids    Diverticulosis of large intestine without diverticulitis    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 Chronic asthma, mild intermittent, uncomplicated Stable at this time we will continue to follow along closely patient's medications were reviewed and discussed prevention of flareups in the winter months and during ragweed season.  2. Obesity, morbid (HCC) Obesity Counseling: Had a lengthy discussion regarding patients BMI and weight issues. Patient was instructed on portion control as well as increased activity. Also discussed caloric restrictions with trying to maintain intake less than 2000 Kcal. Discussions were made in accordance  with the 5As of weight management. Simple actions such as not eating late and if able to, taking a walk is suggested.   3. Nonalcoholic fatty liver disease without nonalcoholic steatohepatitis (NASH) Follow-up with primary care team.  Patient needs to have labs followed up also.  General Counseling: I have discussed the findings of the evaluation and examination with Azelyn.  I have also discussed any further diagnostic evaluation thatmay be needed or ordered today. Leighanna verbalizes understanding of the findings of todays visit. We also reviewed her medications today and discussed drug interactions and side effects including but not limited excessive drowsiness and altered mental states. We also discussed that there is always a risk not just to her but also people around her. she has been encouraged to call the office with any questions or concerns that should arise related to todays visit.  No orders of the defined types were placed in this encounter.    Time spent: 46  I have personally obtained a history, examined the patient, evaluated laboratory and imaging results, formulated the assessment and plan and placed orders.    Yevonne Pax, MD [facility] Pulmonary and Critical Care Sleep medicine

## 2022-12-02 ENCOUNTER — Encounter: Payer: Self-pay | Admitting: Nurse Practitioner

## 2022-12-02 ENCOUNTER — Ambulatory Visit: Payer: Managed Care, Other (non HMO) | Admitting: Nurse Practitioner

## 2022-12-02 VITALS — BP 122/88 | HR 63 | Temp 98.2°F | Resp 16 | Ht 61.0 in | Wt 175.6 lb

## 2022-12-02 DIAGNOSIS — E559 Vitamin D deficiency, unspecified: Secondary | ICD-10-CM

## 2022-12-02 DIAGNOSIS — E782 Mixed hyperlipidemia: Secondary | ICD-10-CM | POA: Diagnosis not present

## 2022-12-02 DIAGNOSIS — E039 Hypothyroidism, unspecified: Secondary | ICD-10-CM

## 2022-12-02 DIAGNOSIS — K760 Fatty (change of) liver, not elsewhere classified: Secondary | ICD-10-CM | POA: Insufficient documentation

## 2022-12-02 DIAGNOSIS — J4520 Mild intermittent asthma, uncomplicated: Secondary | ICD-10-CM | POA: Diagnosis not present

## 2022-12-02 MED ORDER — BUDESONIDE-FORMOTEROL FUMARATE 80-4.5 MCG/ACT IN AERO
2.0000 | INHALATION_SPRAY | Freq: Two times a day (BID) | RESPIRATORY_TRACT | 3 refills | Status: DC
Start: 2022-12-02 — End: 2023-06-09

## 2022-12-02 MED ORDER — LEVOTHYROXINE SODIUM 100 MCG PO TABS: 100.0000 ug | ORAL_TABLET | Freq: Every day | ORAL | 3 refills | Status: DC

## 2022-12-02 NOTE — Progress Notes (Signed)
[facility] [address] [place], [place] [address]  Internal MEDICINE  Office Visit Note  Patient Name: Beverly Campbell  [id number]  [id number]  Date of Service: 12/02/2022  Chief Complaint  Patient presents with   Follow-up    HPI Beverly Campbell presents for a follow-up visit for hypothyroidism, asthma, high cholesterol, abnormal labs  Hypothyroidism -- controlled at current dose, due for refills  Asthma -- taking symbicort sometimes but otherwise no issues Elevated cholesterol -- need to recheck lab Abnormal CBC -- history of ethnic related abnormality per patient has been evaluated by 2 different hematologists    Current Medication: Outpatient Encounter Medications as of 12/02/2022  Medication Sig   albuterol (PROVENTIL) (2.5 MG/3ML) 0.083% nebulizer solution Take 3 mLs (2.5 mg total) by nebulization every 6 (six) hours as needed for wheezing or shortness of breath.   albuterol (VENTOLIN HFA) 108 (90 Base) MCG/ACT inhaler Inhale 2 puffs into the lungs every 6 (six) hours as needed for wheezing or shortness of breath.   Multiple Vitamins-Iron (MULTIVITAMIN/IRON PO) Take by mouth.   triamcinolone (KENALOG) 0.025 % cream Apply 1 application topically 2 (two) times daily.   [DISCONTINUED] amoxicillin-clavulanate (AUGMENTIN) 875-125 MG tablet Take 1 tablet by mouth 2 (two) times daily.   [DISCONTINUED] benzonatate (TESSALON) 100 MG capsule Take 1 capsule (100 mg total) by mouth 2 (two) times daily as needed for cough.   [DISCONTINUED] budesonide-formoterol (SYMBICORT) 80-4.5 MCG/ACT inhaler Inhale 2 puffs into the lungs 2 (two) times daily.   [DISCONTINUED] levothyroxine (SYNTHROID) 100 MCG tablet TAKE 1 TABLET BY MOUTH DAILY   budesonide-formoterol (SYMBICORT) 80-4.5 MCG/ACT inhaler Inhale 2 puffs into the lungs 2 (two) times daily.   levothyroxine (SYNTHROID) 100 MCG tablet Take 1 tablet (100 mcg total) by mouth daily.   No facility-administered encounter  medications on file as of 12/02/2022.    Surgical History: Past Surgical History:  Procedure Laterality Date   COLONOSCOPY WITH PROPOFOL N/A 01/01/2016   Procedure: COLONOSCOPY WITH PROPOFOL;  Surgeon: Wyline Mood, MD;  Location: [facility] ENDOSCOPY;  Service: Endoscopy;  Laterality: N/A;   ESOPHAGOGASTRODUODENOSCOPY (EGD) WITH PROPOFOL N/A 01/01/2016   Procedure: ESOPHAGOGASTRODUODENOSCOPY (EGD) WITH PROPOFOL;  Surgeon: Wyline Mood, MD;  Location: [facility] ENDOSCOPY;  Service: Endoscopy;  Laterality: N/A;   THYROID SURGERY  1997   had thyroid removed   TUBAL LIGATION  1995    Medical History: Past Medical History:  Diagnosis Date   Anemia    Asthma    Hypothyroidism    Thyroid disease     Family History: Family History  Problem Relation Age of Onset   Colon cancer Father    Hypertension Mother    Diabetes Mother    Breast cancer Neg Hx     Social History   Socioeconomic History   Marital status: Divorced    Spouse name: Not on file   Number of children: Not on file   Years of education: Not on file   Highest education level: Not on file  Occupational History   Not on file  Tobacco Use   Smoking status: Never   Smokeless tobacco: Never  Substance and Sexual Activity   Alcohol use: Yes    Comment: occasionally   Drug use: No   Sexual activity: Yes    Birth control/protection: None  Other Topics Concern   Not on file  Social History Narrative   Not on file   Social Determinants of Health   Financial Resource Strain: Not on file  Food Insecurity: Not  on file  Transportation Needs: Not on file  Physical Activity: Not on file  Stress: Not on file  Social Connections: Not on file  Intimate Partner Violence: Not on file      Review of Systems  Constitutional:  Negative for appetite change, chills, fatigue and fever.  HENT:  Negative for congestion, postnasal drip, rhinorrhea, sinus pressure and sneezing.   Respiratory:  Negative for cough, chest tightness,  shortness of breath and wheezing.   Cardiovascular: Negative.  Negative for chest pain and palpitations.    Vital Signs: BP 122/88   Pulse 63   Temp 98.2 F (36.8 C)   Resp 16   Ht 5\' 1"  (1.549 m)   Wt 175 lb 9.6 oz (79.7 kg)   LMP 04/20/2019   SpO2 97%   BMI 33.18 kg/m    Physical Exam Vitals reviewed.  Constitutional:      General: She is not in acute distress.    Appearance: Normal appearance. She is normal weight. She is not ill-appearing.  HENT:     Head: Normocephalic and atraumatic.  Eyes:     Pupils: Pupils are equal, round, and reactive to light.  Cardiovascular:     Rate and Rhythm: Normal rate and regular rhythm.     Heart sounds: Normal heart sounds. No murmur heard. Pulmonary:     Effort: Pulmonary effort is normal. No respiratory distress.     Breath sounds: Normal breath sounds. No wheezing.  Neurological:     Mental Status: She is alert and oriented to person, place, and time.  Psychiatric:        Mood and Affect: Mood normal.        Behavior: Behavior normal.        Assessment/Plan: 1. Acquired hypothyroidism Continue levothyroxine as prescribed . - levothyroxine (SYNTHROID) 100 MCG tablet; Take 1 tablet (100 mcg total) by mouth daily.  Dispense: 90 tablet; Refill: 3  2. Mild intermittent asthma without complication Continue symbicort as prescribed  - budesonide-formoterol (SYMBICORT) 80-4.5 MCG/ACT inhaler; Inhale 2 puffs into the lungs 2 (two) times daily.  Dispense: 3 each; Refill: 3  3. Nonalcoholic fatty liver disease without nonalcoholic steatohepatitis (NASH) Repeat labs ordered  - Lipid Profile  4. Mixed hyperlipidemia Routine labs ordered to repeat.  - CBC with Differential/Platelet - CMP14+EGFR - Lipid Profile  5. Vitamin D deficiency Repeat vitamin D level - Vitamin D (25 hydroxy)   General Counseling: Beverly Campbell verbalizes understanding of the findings of todays visit and agrees with plan of treatment. I have discussed  any further diagnostic evaluation that may be needed or ordered today. We also reviewed her medications today. she has been encouraged to call the office with any questions or concerns that should arise related to todays visit.    Orders Placed This Encounter  Procedures   CBC with Differential/Platelet   CMP14+EGFR   Vitamin D (25 hydroxy)   Lipid Profile    Meds ordered this encounter  Medications   levothyroxine (SYNTHROID) 100 MCG tablet    Sig: Take 1 tablet (100 mcg total) by mouth daily.    Dispense:  90 tablet    Refill:  3    Please send a replace/new response with 90-Day Supply if appropriate to maximize member benefit. Requesting 1 year supply.   budesonide-formoterol (SYMBICORT) 80-4.5 MCG/ACT inhaler    Sig: Inhale 2 puffs into the lungs 2 (two) times daily.    Dispense:  3 each    Refill:  3  Fill for 90 days    Return for previously scheduled in may 2025, will call if appt needed for lab review.   Total time spent:30 Minutes Time spent includes review of chart, medications, test results, and follow up plan with the patient.   [place] Controlled Substance Database was reviewed by me.  This patient was seen by Sallyanne Kuster, FNP-C in collaboration with Dr. Beverely Risen as a part of collaborative care agreement.   Shivank Pinedo R. Tedd Sias, MSN, FNP-C Internal medicine

## 2023-06-04 ENCOUNTER — Encounter: Payer: Managed Care, Other (non HMO) | Admitting: Nurse Practitioner

## 2023-06-09 ENCOUNTER — Ambulatory Visit (INDEPENDENT_AMBULATORY_CARE_PROVIDER_SITE_OTHER): Payer: Managed Care, Other (non HMO) | Admitting: Nurse Practitioner

## 2023-06-09 ENCOUNTER — Encounter: Payer: Self-pay | Admitting: Nurse Practitioner

## 2023-06-09 VITALS — BP 126/84 | HR 74 | Temp 98.5°F | Resp 16 | Ht 61.0 in | Wt 179.6 lb

## 2023-06-09 DIAGNOSIS — E538 Deficiency of other specified B group vitamins: Secondary | ICD-10-CM

## 2023-06-09 DIAGNOSIS — E559 Vitamin D deficiency, unspecified: Secondary | ICD-10-CM

## 2023-06-09 DIAGNOSIS — J4520 Mild intermittent asthma, uncomplicated: Secondary | ICD-10-CM | POA: Diagnosis not present

## 2023-06-09 DIAGNOSIS — D509 Iron deficiency anemia, unspecified: Secondary | ICD-10-CM | POA: Diagnosis not present

## 2023-06-09 DIAGNOSIS — M5030 Other cervical disc degeneration, unspecified cervical region: Secondary | ICD-10-CM

## 2023-06-09 DIAGNOSIS — E039 Hypothyroidism, unspecified: Secondary | ICD-10-CM

## 2023-06-09 DIAGNOSIS — M25521 Pain in right elbow: Secondary | ICD-10-CM

## 2023-06-09 DIAGNOSIS — K760 Fatty (change of) liver, not elsewhere classified: Secondary | ICD-10-CM

## 2023-06-09 DIAGNOSIS — Z0001 Encounter for general adult medical examination with abnormal findings: Secondary | ICD-10-CM

## 2023-06-09 DIAGNOSIS — Z1231 Encounter for screening mammogram for malignant neoplasm of breast: Secondary | ICD-10-CM

## 2023-06-09 DIAGNOSIS — E782 Mixed hyperlipidemia: Secondary | ICD-10-CM

## 2023-06-09 MED ORDER — BUDESONIDE-FORMOTEROL FUMARATE 160-4.5 MCG/ACT IN AERO: 2.0000 | INHALATION_SPRAY | Freq: Two times a day (BID) | RESPIRATORY_TRACT | 12 refills | Status: AC

## 2023-06-09 MED ORDER — ALBUTEROL SULFATE HFA 108 (90 BASE) MCG/ACT IN AERS: 2.0000 | INHALATION_SPRAY | Freq: Four times a day (QID) | RESPIRATORY_TRACT | 3 refills | Status: AC | PRN

## 2023-06-09 NOTE — Progress Notes (Signed)
 [facility] [address] [place], [place] [address]  Internal MEDICINE  Office Visit Note  Patient Name: Beverly Campbell  [id number]  [id number]  Date of Service: 06/09/2023  Chief Complaint  Patient presents with   Annual Exam    HPI Beverly Campbell presents for an annual well visit and physical exam.  Well-appearing 58 y.o. female with mild intermittent asthma, hypothyroidism, vitamin D  deficiency, and a history of anemia.  Routine CRC screening: due in 2027 Routine mammogram: due now DEXA scan: done in 2023 Pap smear: due in 2027 Labs: due for routine labs  New or worsening pain: some recurring pain in her right elbow/forearm.  Degenerative disc disease in her cervical spine causing radiation of pain down the right arm and numbness in both arms intermittently esp when laying down    Current Medication: Outpatient Encounter Medications as of 06/09/2023  Medication Sig   albuterol  (PROVENTIL ) (2.5 MG/3ML) 0.083% nebulizer solution Take 3 mLs (2.5 mg total) by nebulization every 6 (six) hours as needed for wheezing or shortness of breath.   budesonide -formoterol  (SYMBICORT ) 160-4.5 MCG/ACT inhaler Inhale 2 puffs into the lungs in the morning and at bedtime.   levothyroxine  (SYNTHROID ) 100 MCG tablet Take 1 tablet (100 mcg total) by mouth daily.   Multiple Vitamins-Iron (MULTIVITAMIN/IRON PO) Take by mouth.   triamcinolone  (KENALOG ) 0.025 % cream Apply 1 application topically 2 (two) times daily.   [DISCONTINUED] albuterol  (VENTOLIN  HFA) 108 (90 Base) MCG/ACT inhaler Inhale 2 puffs into the lungs every 6 (six) hours as needed for wheezing or shortness of breath.   [DISCONTINUED] budesonide -formoterol  (SYMBICORT ) 80-4.5 MCG/ACT inhaler Inhale 2 puffs into the lungs 2 (two) times daily.   albuterol  (VENTOLIN  HFA) 108 (90 Base) MCG/ACT inhaler Inhale 2 puffs into the lungs every 6 (six) hours as needed for wheezing or shortness of breath.   No  facility-administered encounter medications on file as of 06/09/2023.    Surgical History: Past Surgical History:  Procedure Laterality Date   COLONOSCOPY WITH PROPOFOL  N/A 01/01/2016   Procedure: COLONOSCOPY WITH PROPOFOL ;  Surgeon: Beverly Salaam, MD;  Location: [facility] ENDOSCOPY;  Service: Endoscopy;  Laterality: N/A;   ESOPHAGOGASTRODUODENOSCOPY (EGD) WITH PROPOFOL  N/A 01/01/2016   Procedure: ESOPHAGOGASTRODUODENOSCOPY (EGD) WITH PROPOFOL ;  Surgeon: Beverly Salaam, MD;  Location: [facility] ENDOSCOPY;  Service: Endoscopy;  Laterality: N/A;   THYROID  SURGERY  1997   had thyroid  removed   TUBAL LIGATION  1995    Medical History: Past Medical History:  Diagnosis Date   Anemia    Asthma    Hypothyroidism    Thyroid  disease     Family History: Family History  Problem Relation Age of Onset   Colon cancer Father    Hypertension Mother    Diabetes Mother    Breast cancer Neg Hx     Social History   Socioeconomic History   Marital status: Divorced    Spouse name: Not on file   Number of children: Not on file   Years of education: Not on file   Highest education level: Not on file  Occupational History   Not on file  Tobacco Use   Smoking status: Never   Smokeless tobacco: Never  Substance and Sexual Activity   Alcohol use: Yes    Comment: occasionally   Drug use: No   Sexual activity: Yes    Birth control/protection: None  Other Topics Concern   Not on file  Social History Narrative   Not on file   Social Drivers of Health  Financial Resource Strain: Not on file  Food Insecurity: Not on file  Transportation Needs: Not on file  Physical Activity: Not on file  Stress: Not on file  Social Connections: Not on file  Intimate Partner Violence: Not on file      Review of Systems  Constitutional:  Negative for activity change, appetite change, chills, fatigue, fever and unexpected weight change.  HENT: Negative.  Negative for congestion, ear pain, rhinorrhea, sore throat and  trouble swallowing.   Eyes: Negative.   Respiratory: Negative.  Negative for cough, chest tightness, shortness of breath and wheezing.   Cardiovascular: Negative.  Negative for chest pain.  Gastrointestinal: Negative.  Negative for abdominal pain, blood in stool, constipation, diarrhea, nausea and vomiting.  Endocrine: Negative.   Genitourinary: Negative.  Negative for difficulty urinating, dysuria, frequency, hematuria and urgency.  Musculoskeletal:  Positive for arthralgias (right elbow pain for the past couple of weeks) and neck pain (has DDD in her cervical spine). Negative for back pain, joint swelling and myalgias.  Skin: Negative.  Negative for rash and wound.  Allergic/Immunologic: Negative.  Negative for immunocompromised state.  Neurological: Negative.  Negative for dizziness, seizures, numbness and headaches.  Hematological: Negative.   Psychiatric/Behavioral: Negative.  Negative for behavioral problems, self-injury and suicidal ideas. The patient is not nervous/anxious.     Vital Signs: BP 126/84   Pulse 74   Temp 98.5 F (36.9 C)   Resp 16   Ht 5\' 1"  (1.549 m)   Wt 179 lb 9.6 oz (81.5 kg)   LMP 04/20/2019   SpO2 97%   BMI 33.94 kg/m    Physical Exam Vitals reviewed.  Constitutional:      General: She is awake. She is not in acute distress.    Appearance: Normal appearance. She is well-developed and well-groomed. She is not ill-appearing or diaphoretic.  HENT:     Head: Normocephalic and atraumatic.     Right Ear: Tympanic membrane, ear canal and external ear normal.     Left Ear: Tympanic membrane, ear canal and external ear normal.     Nose: Nose normal. No congestion or rhinorrhea.     Mouth/Throat:     Lips: Pink.     Mouth: Mucous membranes are moist.     Pharynx: Oropharynx is clear. Uvula midline. No oropharyngeal exudate or posterior oropharyngeal erythema.  Eyes:     General: Lids are normal. Vision grossly intact. Gaze aligned appropriately. No  scleral icterus.       Right eye: No discharge.        Left eye: No discharge.     Extraocular Movements: Extraocular movements intact.     Conjunctiva/sclera: Conjunctivae normal.     Pupils: Pupils are equal, round, and reactive to light.     Funduscopic exam:    Right eye: Red reflex present.        Left eye: Red reflex present. Neck:     Thyroid : No thyromegaly.     Vascular: No JVD.     Trachea: Trachea and phonation normal. No tracheal deviation.  Cardiovascular:     Rate and Rhythm: Normal rate and regular rhythm.     Pulses: Normal pulses.     Heart sounds: Normal heart sounds, S1 normal and S2 normal. No murmur heard.    No friction rub. No gallop.  Pulmonary:     Effort: Pulmonary effort is normal. No accessory muscle usage or respiratory distress.     Breath sounds: Normal breath sounds and  air entry. No stridor. No wheezing or rales.  Chest:     Chest wall: No tenderness.     Comments: Declined clinical breast exam, gets routine mammograms.  Abdominal:     General: Bowel sounds are normal. There is no distension.     Palpations: Abdomen is soft. There is no shifting dullness, fluid wave, mass or pulsatile mass.     Tenderness: There is no abdominal tenderness. There is no guarding or rebound.  Musculoskeletal:        General: No tenderness. Normal range of motion.     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     Cervical back: Normal range of motion and neck supple.     Right lower leg: No edema.     Left lower leg: No edema.  Lymphadenopathy:     Cervical: No cervical adenopathy.  Skin:    General: Skin is warm and dry.     Capillary Refill: Capillary refill takes less than 2 seconds.     Coloration: Skin is not pale.     Findings: No erythema or rash.  Neurological:     Mental Status: She is alert and oriented to person, place, and time.     Cranial Nerves: No cranial nerve deficit.     Motor: No  abnormal muscle tone.     Coordination: Coordination normal.     Deep Tendon Reflexes: Reflexes are normal and symmetric.  Psychiatric:        Mood and Affect: Mood normal.        Behavior: Behavior normal. Behavior is cooperative.        Thought Content: Thought content normal.        Judgment: Judgment normal.        Assessment/Plan: 1. Encounter for routine adult health examination with abnormal findings (Primary) Age-appropriate preventive screenings and vaccinations discussed, annual physical exam completed. Routine labs for health maintenance ordered, see below\. PHM updated.   - CBC with Differential/Platelet - CMP14+EGFR - Lipid Profile - B12 and Folate Panel - TSH + free T4 - Iron, TIBC and Ferritin Panel - Vitamin D  (25 hydroxy)  2. Mild intermittent asthma without complication Symbicort  dose strength increased. Continue prn albuterol  as prescribed.  - budesonide -formoterol  (SYMBICORT ) 160-4.5 MCG/ACT inhaler; Inhale 2 puffs into the lungs in the morning and at bedtime.  Dispense: 1 each; Refill: 12 - albuterol  (VENTOLIN  HFA) 108 (90 Base) MCG/ACT inhaler; Inhale 2 puffs into the lungs every 6 (six) hours as needed for wheezing or shortness of breath.  Dispense: 18 g; Refill: 3  3. Acquired hypothyroidism Routine labs ordered  - CBC with Differential/Platelet - CMP14+EGFR - Lipid Profile - TSH + free T4  4. Nonalcoholic fatty liver disease without nonalcoholic steatohepatitis (NASH) Routine labs ordered  - CBC with Differential/Platelet - CMP14+EGFR - Lipid Profile - Iron, TIBC and Ferritin Panel  5. Iron deficiency anemia, unspecified iron deficiency anemia type Routine labs ordered  - CBC with Differential/Platelet - B12 and Folate Panel - Iron, TIBC and Ferritin Panel  6. DDD (degenerative disc disease), cervical Referred to chiropractic - Ambulatory referral to Chiropractic  7. Right elbow pain Referred to chiropractic  - Ambulatory referral to  Chiropractic  8. Mixed hyperlipidemia Routine labs ordered  - CBC with Differential/Platelet - CMP14+EGFR - Lipid Profile - TSH + free T4  9. B12 deficiency Routine labs ordered  - CBC with Differential/Platelet - B12 and Folate Panel - Iron,  TIBC and Ferritin Panel  10. Vitamin D  deficiency Routine lab ordered  - Vitamin D  (25 hydroxy)  11. Encounter for screening mammogram for malignant neoplasm of breast Routine mammogram ordered  - MM 3D SCREENING MAMMOGRAM BILATERAL BREAST; Future     General Counseling: Jalaiya verbalizes understanding of the findings of todays visit and agrees with plan of treatment. I have discussed any further diagnostic evaluation that may be needed or ordered today. We also reviewed her medications today. she has been encouraged to call the office with any questions or concerns that should arise related to todays visit.    Orders Placed This Encounter  Procedures   MM 3D SCREENING MAMMOGRAM BILATERAL BREAST   CBC with Differential/Platelet   CMP14+EGFR   Lipid Profile   B12 and Folate Panel   TSH + free T4   Iron, TIBC and Ferritin Panel   Vitamin D  (25 hydroxy)   Ambulatory referral to Chiropractic    Meds ordered this encounter  Medications   budesonide -formoterol  (SYMBICORT ) 160-4.5 MCG/ACT inhaler    Sig: Inhale 2 puffs into the lungs in the morning and at bedtime.    Dispense:  1 each    Refill:  12    Fill new script today, note change in dose strength. Discontinue 80-4.5 mcg dose inhaler.   albuterol  (VENTOLIN  HFA) 108 (90 Base) MCG/ACT inhaler    Sig: Inhale 2 puffs into the lungs every 6 (six) hours as needed for wheezing or shortness of breath.    Dispense:  18 g    Refill:  3    Please fill as ventolin  as this works better for her than proair . For future refills    Return in about 1 year (around 06/08/2024) for CPE, Delron Comer PCP and otherwise as needed, will call with lab results per patient request..   Total time  spent:30 Minutes Time spent includes review of chart, medications, test results, and follow up plan with the patient.   [place] Controlled Substance Database was reviewed by me.  This patient was seen by Laurence Pons, FNP-C in collaboration with Dr. Verneta Gone as a part of collaborative care agreement.  Mattson Dayal R. Bobbi Burow, MSN, FNP-C Internal medicine

## 2023-06-10 ENCOUNTER — Telehealth: Payer: Self-pay | Admitting: Nurse Practitioner

## 2023-06-10 NOTE — Telephone Encounter (Signed)
 Chiropractic referral faxed to [facility]; [phone]. Lvm notifying patient. Gave pt telephone [phone]

## 2023-07-09 ENCOUNTER — Ambulatory Visit (INDEPENDENT_AMBULATORY_CARE_PROVIDER_SITE_OTHER): Admitting: Nurse Practitioner

## 2023-07-09 ENCOUNTER — Encounter: Payer: Self-pay | Admitting: Nurse Practitioner

## 2023-07-09 ENCOUNTER — Ambulatory Visit: Payer: Self-pay | Admitting: Nurse Practitioner

## 2023-07-09 ENCOUNTER — Ambulatory Visit
Admission: RE | Admit: 2023-07-09 | Discharge: 2023-07-09 | Disposition: A | Discharge status: Home / Self Care | Attending: Nurse Practitioner | Admitting: Nurse Practitioner

## 2023-07-09 ENCOUNTER — Ambulatory Visit
Admission: RE | Admit: 2023-07-09 | Discharge: 2023-07-09 | Disposition: A | Discharge status: Home / Self Care | Source: Ambulatory Visit | Attending: Nurse Practitioner

## 2023-07-09 VITALS — BP 130/82 | HR 76 | Temp 98.2°F | Resp 16 | Ht 61.0 in | Wt 181.0 lb

## 2023-07-09 DIAGNOSIS — J4520 Mild intermittent asthma, uncomplicated: Secondary | ICD-10-CM

## 2023-07-09 DIAGNOSIS — R062 Wheezing: Secondary | ICD-10-CM | POA: Diagnosis not present

## 2023-07-09 DIAGNOSIS — J189 Pneumonia, unspecified organism: Secondary | ICD-10-CM | POA: Insufficient documentation

## 2023-07-09 MED ORDER — ALBUTEROL SULFATE (2.5 MG/3ML) 0.083% IN NEBU: 2.5000 mg | INHALATION_SOLUTION | Freq: Four times a day (QID) | RESPIRATORY_TRACT | 12 refills | Status: AC | PRN

## 2023-07-09 MED ORDER — BENZONATATE 200 MG PO CAPS: 200.0000 mg | ORAL_CAPSULE | Freq: Two times a day (BID) | ORAL | 0 refills | Status: AC | PRN

## 2023-07-09 MED ORDER — PREDNISONE 10 MG (21) PO TBPK: ORAL_TABLET | ORAL | 0 refills | Status: DC

## 2023-07-09 MED ORDER — LEVOFLOXACIN 500 MG PO TABS: 500.0000 mg | ORAL_TABLET | Freq: Every day | ORAL | 0 refills | Status: AC

## 2023-07-09 NOTE — Progress Notes (Unsigned)
 [facility] [address] [place], [place] [address]  Internal MEDICINE  Office Visit Note  Patient Name: Beverly Campbell  [id number]  [id number]  Date of Service: 07/09/2023  Chief Complaint  Patient presents with   Acute Visit    Asthma flare up/ allergy -running nose and sneezing      HPI Nailyn presents for an acute sick visit for respiratory infection.  --onset of symptoms was last Friday. Started with sore throat, runny nose, then her asthma started to flare up with chest tightness, SOB and wheezing, cough. Also reports sinus pressure and right ear pain, scratchy throat, dry throat and dry mouth.      Current Medication:  Outpatient Encounter Medications as of 07/09/2023  Medication Sig   benzonatate  (TESSALON ) 200 MG capsule Take 1 capsule (200 mg total) by mouth 2 (two) times daily as needed for cough.   levofloxacin  (LEVAQUIN ) 500 MG tablet Take 1 tablet (500 mg total) by mouth daily for 7 days.   predniSONE (STERAPRED UNI-PAK 21 TAB) 10 MG (21) TBPK tablet Use as directed for 6 days   albuterol  (PROVENTIL ) (2.5 MG/3ML) 0.083% nebulizer solution Take 3 mLs (2.5 mg total) by nebulization every 6 (six) hours as needed for wheezing or shortness of breath.   albuterol  (VENTOLIN  HFA) 108 (90 Base) MCG/ACT inhaler Inhale 2 puffs into the lungs every 6 (six) hours as needed for wheezing or shortness of breath.   budesonide -formoterol  (SYMBICORT ) 160-4.5 MCG/ACT inhaler Inhale 2 puffs into the lungs in the morning and at bedtime.   levothyroxine  (SYNTHROID ) 100 MCG tablet Take 1 tablet (100 mcg total) by mouth daily.   Multiple Vitamins-Iron (MULTIVITAMIN/IRON PO) Take by mouth.   triamcinolone  (KENALOG ) 0.025 % cream Apply 1 application topically 2 (two) times daily.   [DISCONTINUED] albuterol  (PROVENTIL ) (2.5 MG/3ML) 0.083% nebulizer solution Take 3 mLs (2.5 mg total) by nebulization every 6 (six) hours as needed for wheezing or shortness of breath.    No facility-administered encounter medications on file as of 07/09/2023.      Medical History: Past Medical History:  Diagnosis Date   Anemia    Asthma    Hypothyroidism    Thyroid  disease      Vital Signs: BP 130/82   Pulse 76   Temp 98.2 F (36.8 C)   Resp 16   Ht 5' 1 (1.549 m)   Wt 181 lb (82.1 kg)   LMP 04/20/2019   SpO2 93%   BMI 34.20 kg/m    Review of Systems  Constitutional:  Negative for appetite change, chills, fatigue and fever.  HENT:  Positive for congestion, ear pain, postnasal drip, rhinorrhea and sore throat.   Respiratory:  Positive for cough, chest tightness, shortness of breath and wheezing.   Cardiovascular:  Negative for chest pain and palpitations.  Neurological:  Negative for headaches.    Physical Exam Vitals reviewed.  Constitutional:      General: She is not in acute distress.    Appearance: Normal appearance. She is obese. She is ill-appearing.  HENT:     Head: Normocephalic and atraumatic.     Right Ear: Tympanic membrane, ear canal and external ear normal.     Left Ear: Tympanic membrane, ear canal and external ear normal.     Nose: Congestion and rhinorrhea present.     Mouth/Throat:     Mouth: Mucous membranes are moist.     Pharynx: Posterior oropharyngeal erythema present.   Eyes:     Pupils: Pupils are equal,  round, and reactive to light.    Cardiovascular:     Rate and Rhythm: Normal rate and regular rhythm.     Heart sounds: Normal heart sounds. No murmur heard. Pulmonary:     Effort: Pulmonary effort is normal. No accessory muscle usage or respiratory distress.     Breath sounds: Examination of the right-upper field reveals rales. Examination of the left-upper field reveals rales. Examination of the right-middle field reveals wheezing and rales. Examination of the left-middle field reveals wheezing and rales. Examination of the right-lower field reveals decreased breath sounds and wheezing. Examination of the  left-lower field reveals decreased breath sounds and wheezing. Decreased breath sounds, wheezing and rales present.   Skin:    General: Skin is warm and dry.     Capillary Refill: Capillary refill takes less than 2 seconds.   Neurological:     Mental Status: She is alert and oriented to person, place, and time.   Psychiatric:        Mood and Affect: Mood normal.        Behavior: Behavior normal.       Assessment/Plan: 1. Walking pneumonia (Primary) Levofloxacin  and prednisone prescribed, take until gone. Medications prescribed for symptom relief as well.  - predniSONE (STERAPRED UNI-PAK 21 TAB) 10 MG (21) TBPK tablet; Use as directed for 6 days  Dispense: 21 tablet; Refill: 0 - levofloxacin  (LEVAQUIN ) 500 MG tablet; Take 1 tablet (500 mg total) by mouth daily for 7 days.  Dispense: 7 tablet; Refill: 0 - benzonatate  (TESSALON ) 200 MG capsule; Take 1 capsule (200 mg total) by mouth 2 (two) times daily as needed for cough.  Dispense: 30 capsule; Refill: 0 - albuterol  (PROVENTIL ) (2.5 MG/3ML) 0.083% nebulizer solution; Take 3 mLs (2.5 mg total) by nebulization every 6 (six) hours as needed for wheezing or shortness of breath.  Dispense: 75 mL; Refill: 12 - DG Chest 2 View; Future  2. Mild intermittent asthma without complication Prednisone and albuterol  neb treatments prescribed.  - predniSONE (STERAPRED UNI-PAK 21 TAB) 10 MG (21) TBPK tablet; Use as directed for 6 days  Dispense: 21 tablet; Refill: 0 - albuterol  (PROVENTIL ) (2.5 MG/3ML) 0.083% nebulizer solution; Take 3 mLs (2.5 mg total) by nebulization every 6 (six) hours as needed for wheezing or shortness of breath.  Dispense: 75 mL; Refill: 12 - DG Chest 2 View; Future  3. Wheezing Prednisone taper and levofloxacin  prescribed.    General Counseling: Ayeshia verbalizes understanding of the findings of todays visit and agrees with plan of treatment. I have discussed any further diagnostic evaluation that may be needed or  ordered today. We also reviewed her medications today. she has been encouraged to call the office with any questions or concerns that should arise related to todays visit.    Counseling:    Orders Placed This Encounter  Procedures   DG Chest 2 View    Meds ordered this encounter  Medications   predniSONE (STERAPRED UNI-PAK 21 TAB) 10 MG (21) TBPK tablet    Sig: Use as directed for 6 days    Dispense:  21 tablet    Refill:  0   levofloxacin  (LEVAQUIN ) 500 MG tablet    Sig: Take 1 tablet (500 mg total) by mouth daily for 7 days.    Dispense:  7 tablet    Refill:  0    Fill new script today   benzonatate  (TESSALON ) 200 MG capsule    Sig: Take 1 capsule (200 mg total) by mouth 2 (  two) times daily as needed for cough.    Dispense:  30 capsule    Refill:  0    Fill new script today   albuterol  (PROVENTIL ) (2.5 MG/3ML) 0.083% nebulizer solution    Sig: Take 3 mLs (2.5 mg total) by nebulization every 6 (six) hours as needed for wheezing or shortness of breath.    Dispense:  75 mL    Refill:  12    Fill new script today    Return if symptoms worsen or fail to improve.  [place] Controlled Substance Database was reviewed by me for overdose risk score (ORS)  Time spent:30 Minutes Time spent with patient included reviewing progress notes, labs, imaging studies, and discussing plan for follow up.   This patient was seen by Laurence Pons, FNP-C in collaboration with Dr. Verneta Gone as a part of collaborative care agreement.  Katerra Ingman R. Bobbi Burow, MSN, FNP-C Internal Medicine

## 2023-07-10 ENCOUNTER — Telehealth: Payer: Self-pay | Admitting: Nurse Practitioner

## 2023-07-10 ENCOUNTER — Ambulatory Visit: Payer: Self-pay | Admitting: Nurse Practitioner

## 2023-07-10 LAB — CBC WITH DIFFERENTIAL/PLATELET
Basophils Absolute: 0.1 10*3/uL (ref 0.0–0.2)
Basos: 2 %
EOS (ABSOLUTE): 0.5 10*3/uL — ABNORMAL HIGH (ref 0.0–0.4)
Eos: 13 %
Hematocrit: 41.1 % (ref 34.0–46.6)
Hemoglobin: 12.9 g/dL (ref 11.1–15.9)
Immature Grans (Abs): 0 10*3/uL (ref 0.0–0.1)
Immature Granulocytes: 0 %
Lymphocytes Absolute: 1.3 10*3/uL (ref 0.7–3.1)
Lymphs: 36 %
MCH: 20.1 pg — ABNORMAL LOW (ref 26.6–33.0)
MCHC: 31.4 g/dL — ABNORMAL LOW (ref 31.5–35.7)
MCV: 64 fL — ABNORMAL LOW (ref 79–97)
Monocytes Absolute: 0.5 10*3/uL (ref 0.1–0.9)
Monocytes: 14 %
Neutrophils Absolute: 1.3 10*3/uL — ABNORMAL LOW (ref 1.4–7.0)
Neutrophils: 35 %
Platelets: 243 10*3/uL (ref 150–450)
RBC: 6.42 x10E6/uL — ABNORMAL HIGH (ref 3.77–5.28)
RDW: 18.6 % — ABNORMAL HIGH (ref 11.7–15.4)
WBC: 3.7 10*3/uL (ref 3.4–10.8)

## 2023-07-10 LAB — CMP14+EGFR
ALT: 27 IU/L (ref 0–32)
AST: 26 IU/L (ref 0–40)
Albumin: 4.5 g/dL (ref 3.8–4.9)
Alkaline Phosphatase: 81 IU/L (ref 44–121)
BUN/Creatinine Ratio: 19 (ref 9–23)
BUN: 16 mg/dL (ref 6–24)
Bilirubin Total: 1 mg/dL (ref 0.0–1.2)
CO2: 23 mmol/L (ref 20–29)
Calcium: 9.9 mg/dL (ref 8.7–10.2)
Chloride: 100 mmol/L (ref 96–106)
Creatinine, Ser: 0.85 mg/dL (ref 0.57–1.00)
Globulin, Total: 2.9 g/dL (ref 1.5–4.5)
Glucose: 100 mg/dL — ABNORMAL HIGH (ref 70–99)
Potassium: 4.7 mmol/L (ref 3.5–5.2)
Sodium: 139 mmol/L (ref 134–144)
Total Protein: 7.4 g/dL (ref 6.0–8.5)
eGFR: 80 mL/min/{1.73_m2} (ref >59)

## 2023-07-10 LAB — LIPID PANEL
Chol/HDL Ratio: 3.3 ratio (ref 0.0–4.4)
Cholesterol, Total: 247 mg/dL — ABNORMAL HIGH (ref 100–199)
HDL: 74 mg/dL (ref >39)
LDL Chol Calc (NIH): 151 mg/dL — ABNORMAL HIGH (ref 0–99)
Triglycerides: 127 mg/dL (ref 0–149)
VLDL Cholesterol Cal: 22 mg/dL (ref 5–40)

## 2023-07-10 LAB — B12 AND FOLATE PANEL
Folate: >20 ng/mL (ref >3.0)
Vitamin B-12: 641 pg/mL (ref 232–1245)

## 2023-07-10 LAB — IRON,TIBC AND FERRITIN PANEL
Ferritin: 423 ng/mL — ABNORMAL HIGH (ref 15–150)
Iron Saturation: 26 % (ref 15–55)
Iron: 90 ug/dL (ref 27–159)
Total Iron Binding Capacity: 349 ug/dL (ref 250–450)
UIBC: 259 ug/dL (ref 131–425)

## 2023-07-10 LAB — TSH+FREE T4
Free T4: 1.63 ng/dL (ref 0.82–1.77)
TSH: 1.25 u[IU]/mL (ref 0.450–4.500)

## 2023-07-10 LAB — VITAMIN D 25 HYDROXY (VIT D DEFICIENCY, FRACTURES): Vit D, 25-Hydroxy: 28.4 ng/mL — ABNORMAL LOW (ref 30.0–100.0)

## 2023-07-10 NOTE — Telephone Encounter (Signed)
 Left message for patient to give office a call back.

## 2023-07-10 NOTE — Progress Notes (Signed)
 Has several abnormal lab results, would like to have a follow up visit to discuss labs at her earliest convenience.

## 2023-07-10 NOTE — Telephone Encounter (Signed)
 Lvm to schedule follow up for lab results-Toni

## 2023-07-14 ENCOUNTER — Ambulatory Visit: Admitting: Nurse Practitioner

## 2023-07-14 ENCOUNTER — Encounter: Payer: Self-pay | Admitting: Nurse Practitioner

## 2023-07-14 VITALS — BP 128/86 | HR 80 | Temp 98.2°F | Resp 16 | Ht 61.0 in | Wt 180.8 lb

## 2023-07-14 DIAGNOSIS — E782 Mixed hyperlipidemia: Secondary | ICD-10-CM

## 2023-07-14 DIAGNOSIS — E559 Vitamin D deficiency, unspecified: Secondary | ICD-10-CM | POA: Diagnosis not present

## 2023-07-14 DIAGNOSIS — R7989 Other specified abnormal findings of blood chemistry: Secondary | ICD-10-CM

## 2023-07-14 DIAGNOSIS — D751 Secondary polycythemia: Secondary | ICD-10-CM

## 2023-07-14 NOTE — Patient Instructions (Signed)
 Try flaxseed oil supplement to help lower cholesterol.

## 2023-07-14 NOTE — Progress Notes (Signed)
 [facility] [address] [place], [place] [address]  Internal MEDICINE  Office Visit Note  Patient Name: Beverly Campbell  [id number]  [id number]  Date of Service: 07/14/2023  Chief Complaint  Patient presents with   Follow-up    Review labs    HPI Loeta presents for a follow-up visit for lab results.  Elevated RBC -- persistently elevated, has not seen hematology previously for this problem. 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   Elevated ferritin -- persistently elevated Elevated LDL -- HDL is good at 74 but LDL is elevated to 151, and total cholesterol is elevated. Not currently on statin therapy or fish oil supplement.  Vitamin D  is improving but still slightly low.     Current Medication: Outpatient Encounter Medications as of 07/14/2023  Medication Sig   albuterol  (PROVENTIL ) (2.5 MG/3ML) 0.083% nebulizer solution Take 3 mLs (2.5 mg total) by nebulization every 6 (six) hours as needed for wheezing or shortness of breath.   albuterol  (VENTOLIN  HFA) 108 (90 Base) MCG/ACT inhaler Inhale 2 puffs into the lungs every 6 (six) hours as needed for wheezing or shortness of breath.   benzonatate  (TESSALON ) 200 MG capsule Take 1 capsule (200 mg total) by mouth 2 (two) times daily as needed for cough.   budesonide -formoterol  (SYMBICORT ) 160-4.5 MCG/ACT inhaler Inhale 2 puffs into the lungs in the morning and at bedtime.   levofloxacin  (LEVAQUIN ) 500 MG tablet Take 1 tablet (500 mg total) by mouth daily for 7 days.   levothyroxine  (SYNTHROID ) 100 MCG tablet Take 1 tablet (100 mcg total) by mouth daily.   Multiple Vitamins-Iron (MULTIVITAMIN/IRON PO) Take by mouth.   predniSONE  (STERAPRED UNI-PAK 21 TAB) 10 MG (21) TBPK tablet Use as directed for 6 days   triamcinolone  (KENALOG ) 0.025 % cream Apply 1 application topically 2 (two) times daily.   No facility-administered encounter medications on file as of 07/14/2023.     Surgical History: Past Surgical History:  Procedure Laterality Date   COLONOSCOPY WITH PROPOFOL  N/A 01/01/2016   Procedure: COLONOSCOPY WITH PROPOFOL ;  Surgeon: Ruel Kung, MD;  Location: [facility];  Service: Endoscopy;  Laterality: N/A;   ESOPHAGOGASTRODUODENOSCOPY (EGD) WITH PROPOFOL  N/A 01/01/2016   Procedure: ESOPHAGOGASTRODUODENOSCOPY (EGD) WITH PROPOFOL ;  Surgeon: Ruel Kung, MD;  Location: [facility] END[facility]COPY;  Service: Endoscopy;  Laterality: N/A;   THYROID  SURGERY  1997   had thyroid  removed   TUBAL LIGATION  1995    Medical History: Past Medical History:  Diagnosis Date   Anemia    Asthma    Hypothyroidism    Thyroid  disease     Family History: Family History  Problem Relation Age of Onset   Colon cancer Father    Hypertension Mother    Diabetes Mother    Breast cancer Neg Hx     Social History   Socioeconomic History   Marital status: Divorced    Spouse name: Not on file   Number of children: Not on file   Years of education: Not on file   Highest education level: Not on file  Occupational History   Not on file  Tobacco Use   Smoking status: Never   Smokeless tobacco: Never  Substance and Sexual Activity   Alcohol use: Yes    Comment: occasionally   Drug use: No   Sexual activity: Yes    Birth control/protection: None  Other Topics Concern   Not on file  Social History Narrative   Not  on file   Social Drivers of Health   Financial Resource Strain: Not on file  Food Insecurity: Not on file  Transportation Needs: Not on file  Physical Activity: Not on file  Stress: Not on file  Social Connections: Not on file  Intimate Partner Violence: Not on file      Review of Systems  Constitutional:  Negative for appetite change, chills, fatigue and fever.  HENT:  Negative for congestion, postnasal drip, rhinorrhea, sinus pressure and sneezing.   Respiratory:  Negative for cough, chest tightness, shortness of breath and wheezing.    Cardiovascular: Negative.  Negative for chest pain and palpitations.    Vital Signs: BP 128/86   Pulse 80   Temp 98.2 F (36.8 C)   Resp 16   Ht 5' 1 (1.549 m)   Wt 180 lb 12.8 oz (82 kg)   LMP 04/20/2019   SpO2 95%   BMI 34.16 kg/m    Physical Exam Vitals reviewed.  Constitutional:      General: She is not in acute distress.    Appearance: Normal appearance. She is normal weight. She is not ill-appearing.  HENT:     Head: Normocephalic and atraumatic.  Eyes:     Pupils: Pupils are equal, round, and reactive to light.  Cardiovascular:     Rate and Rhythm: Normal rate and regular rhythm.     Heart sounds: Normal heart sounds. No murmur heard. Pulmonary:     Effort: Pulmonary effort is normal. No respiratory distress.     Breath sounds: Normal breath sounds. No wheezing.  Neurological:     Mental Status: She is alert and oriented to person, place, and time.  Psychiatric:        Mood and Affect: Mood normal.        Behavior: Behavior normal.        Assessment/Plan: 1. Erythrocytosis (Primary) Referred to heme/onc for further evaluation  - Ambulatory referral to Hematology / Oncology  2. Elevated ferritin level Referred to heme/onc for further evaluation  - Ambulatory referral to Hematology / Oncology  3. Mixed hyperlipidemia Patient reluctant to start statin therapy. Recommended daily OTC fish oil supplement.   4. Vitamin D  deficiency Continue OTC vitamin D  supplement.    General Counseling: Lori verbalizes understanding of the findings of todays visit and agrees with plan of treatment. I have discussed any further diagnostic evaluation that may be needed or ordered today. We also reviewed her medications today. she has been encouraged to call the office with any questions or concerns that should arise related to todays visit.    Orders Placed This Encounter  Procedures   Ambulatory referral to Hematology / Oncology    No orders of the  defined types were placed in this encounter.   Return for CPE, Decorey Wahlert PCP next year and otherwise as needed, hematology referral placed. .   Total time spent:30 Minutes Time spent includes review of chart, medications, test results, and follow up plan with the patient.   [place] Controlled Substance Database was reviewed by me.  This patient was seen by Mardy Maxin, FNP-C in collaboration with Dr. Sigrid Bathe as a part of collaborative care agreement.   Nallely Yost R. Maxin, MSN, FNP-C Internal medicine

## 2023-07-28 ENCOUNTER — Encounter: Admitting: Internal Medicine

## 2023-07-29 ENCOUNTER — Other Ambulatory Visit: Payer: Self-pay | Admitting: Nurse Practitioner

## 2023-07-29 DIAGNOSIS — E039 Hypothyroidism, unspecified: Secondary | ICD-10-CM

## 2023-08-04 ENCOUNTER — Inpatient Hospital Stay: Attending: Internal Medicine | Admitting: Internal Medicine

## 2023-08-04 ENCOUNTER — Inpatient Hospital Stay

## 2023-08-04 ENCOUNTER — Encounter: Payer: Self-pay | Admitting: Internal Medicine

## 2023-08-04 VITALS — BP 145/90 | HR 60 | Temp 97.3°F | Resp 16 | Ht 61.0 in | Wt 184.9 lb

## 2023-08-04 DIAGNOSIS — Z800 Family history of malignant neoplasm of digestive organs: Secondary | ICD-10-CM | POA: Diagnosis not present

## 2023-08-04 DIAGNOSIS — M79601 Pain in right arm: Secondary | ICD-10-CM | POA: Insufficient documentation

## 2023-08-04 DIAGNOSIS — R7989 Other specified abnormal findings of blood chemistry: Secondary | ICD-10-CM | POA: Insufficient documentation

## 2023-08-04 DIAGNOSIS — K760 Fatty (change of) liver, not elsewhere classified: Secondary | ICD-10-CM | POA: Insufficient documentation

## 2023-08-04 NOTE — Progress Notes (Signed)
 C/o rt arm pain, seeing chiropractor. Pain radiates up and down arm x1 month.

## 2023-08-04 NOTE — Progress Notes (Signed)
 [facility] CONSULT NOTE  Patient Care Team: Liana Fish, NP as PCP - General (Nurse Practitioner) Therisa Bi, MD as Consulting Physician (Surgery) Rennie Cindy SAUNDERS, MD as Consulting Physician (Oncology)  CHIEF COMPLAINTS/PURPOSE OF CONSULTATION: Hereditary hemochromatosis/Elevated Ferritn.    HEMATOLOGY HISTORY  #  FERRITIN:  Iron sat:  HISTORY OF PRESENTING ILLNESS:  Beverly Campbell 58 y.o.  female has been referred to us  for further evaluation/work-up/ and recommendations for elevated studies-ferritin.  Patient had previously seen in clinic for IDA from heavy cycles s/p IV venofer. Patient last infusion few year ago. Currently in menopause. Currently on MVT with iron.   C/o rt arm pain, seeing chiropractor. Pain radiates up and down arm x1 month.  Otherwise denies any worsening joint pains all over.   Personal Hx of liver disease: Liver function tests are normal, however patient said that she has been diagnosed with fatty liver on ultrasound few years ago. Hepatitis B/C: None Alcohol: rare  Family Hx of Liver disease or cancer:stomach cancer.   Review of Systems  Constitutional:  Negative for chills, diaphoresis, fever, malaise/fatigue and weight loss.  HENT:  Negative for nosebleeds and sore throat.   Eyes:  Negative for double vision.  Respiratory:  Negative for cough, hemoptysis, sputum production, shortness of breath and wheezing.   Cardiovascular:  Negative for chest pain, palpitations, orthopnea and leg swelling.  Gastrointestinal:  Negative for abdominal pain, blood in stool, constipation, diarrhea, heartburn, melena, nausea and vomiting.  Genitourinary:  Negative for dysuria, frequency and urgency.  Musculoskeletal:  Positive for back pain and joint pain.  Skin: Negative.  Negative for itching and rash.  Neurological:  Negative for dizziness, tingling, focal weakness, weakness and headaches.  Endo/Heme/Allergies:  Does not  bruise/bleed easily.  Psychiatric/Behavioral:  Negative for depression. The patient is not nervous/anxious and does not have insomnia.     MEDICAL HISTORY:  Past Medical History:  Diagnosis Date   Anemia    Asthma    Hypothyroidism    Thyroid  disease     SURGICAL HISTORY: Past Surgical History:  Procedure Laterality Date   COLONOSCOPY WITH PROPOFOL  N/A 01/01/2016   Procedure: COLONOSCOPY WITH PROPOFOL ;  Surgeon: Bi Therisa, MD;  Location: [facility];  Service: Endoscopy;  Laterality: N/A;   ESOPHAGOGASTRODUODENOSCOPY (EGD) WITH PROPOFOL  N/A 01/01/2016   Procedure: ESOPHAGOGASTRODUODENOSCOPY (EGD) WITH PROPOFOL ;  Surgeon: Bi Therisa, MD;  Location: [facility];  Service: Endoscopy;  Laterality: N/A;   THYROID  SURGERY  1997   had thyroid  removed   TUBAL LIGATION  1995    SOCIAL HISTORY: Social History   Socioeconomic History   Marital status: Divorced    Spouse name: Not on file   Number of children: Not on file   Years of education: Not on file   Highest education level: Not on file  Occupational History   Not on file  Tobacco Use   Smoking status: Never   Smokeless tobacco: Never  Vaping Use   Vaping status: Never Used  Substance and Sexual Activity   Alcohol use: Yes    Comment: occasionally   Drug use: No   Sexual activity: Yes    Birth control/protection: None  Other Topics Concern   Not on file  Social History Narrative   Not on file   Social Drivers of Health   Financial Resource Strain: Not on file  Food Insecurity: No Food Insecurity (08/04/2023)   Hunger Vital Sign    Worried About Running Out of Food in the Last Year:  Never true    Ran Out of Food in the Last Year: Never true  Transportation Needs: No Transportation Needs (08/04/2023)   PRAPARE - Administrator, Civil Service (Medical): No    Lack of Transportation (Non-Medical): No  Physical Activity: Not on file  Stress: Not on file  Social Connections: Not on file  Intimate  Partner Violence: Not At Risk (08/04/2023)   Humiliation, Afraid, Rape, and Kick questionnaire    Fear of Current or Ex-Partner: No    Emotionally Abused: No    Physically Abused: No    Sexually Abused: No    FAMILY HISTORY: Family History  Problem Relation Age of Onset   Colon cancer Father    Hypertension Mother    Diabetes Mother    Breast cancer Neg Hx     ALLERGIES:  has no known allergies.  MEDICATIONS:  Current Outpatient Medications  Medication Sig Dispense Refill   albuterol  (PROVENTIL ) (2.5 MG/3ML) 0.083% nebulizer solution Take 3 mLs (2.5 mg total) by nebulization every 6 (six) hours as needed for wheezing or shortness of breath. 75 mL 12   albuterol  (VENTOLIN  HFA) 108 (90 Base) MCG/ACT inhaler Inhale 2 puffs into the lungs every 6 (six) hours as needed for wheezing or shortness of breath. 18 g 3   benzonatate  (TESSALON ) 200 MG capsule Take 1 capsule (200 mg total) by mouth 2 (two) times daily as needed for cough. 30 capsule 0   budesonide -formoterol  (SYMBICORT ) 160-4.5 MCG/ACT inhaler Inhale 2 puffs into the lungs in the morning and at bedtime. 1 each 12   levothyroxine  (SYNTHROID ) 100 MCG tablet TAKE 1 TABLET BY MOUTH DAILY 90 tablet 3   Multiple Vitamins-Iron (MULTIVITAMIN/IRON PO) Take by mouth.     triamcinolone  (KENALOG ) 0.025 % cream Apply 1 application topically 2 (two) times daily. 80 g 2   No current facility-administered medications for this visit.      PHYSICAL EXAMINATION:   Vitals:   08/04/23 1042  BP: (!) 145/90  Pulse: 60  Resp: 16  Temp: (!) 97.3 F (36.3 C)  SpO2: 97%   Filed Weights   08/04/23 1042  Weight: 184 lb 14.4 oz (83.9 kg)    Physical Exam Vitals and nursing note reviewed.  HENT:     Head: Normocephalic and atraumatic.     Mouth/Throat:     Pharynx: Oropharynx is clear.  Eyes:     Extraocular Movements: Extraocular movements intact.     Pupils: Pupils are equal, round, and reactive to light.  Cardiovascular:     Rate  and Rhythm: Normal rate and regular rhythm.  Pulmonary:     Comments: Decreased breath sounds bilaterally.  Abdominal:     Palpations: Abdomen is soft.  Musculoskeletal:        General: Normal range of motion.     Cervical back: Normal range of motion.  Skin:    General: Skin is warm.  Neurological:     General: No focal deficit present.     Mental Status: She is alert and oriented to person, place, and time.  Psychiatric:        Behavior: Behavior normal.        Judgment: Judgment normal.     LABORATORY DATA:  I have reviewed the data as listed Lab Results  Component Value Date   WBC 3.7 07/09/2023   HGB 12.9 07/09/2023   HCT 41.1 07/09/2023   MCV 64 (L) 07/09/2023   PLT 243 07/09/2023   Recent Labs  07/09/23 1612  NA 139  K 4.7  CL 100  CO2 23  GLUCOSE 100*  BUN 16  CREATININE 0.85  CALCIUM 9.9  PROT 7.4  ALBUMIN 4.5  AST 26  ALT 27  ALKPHOS 81  BILITOT 1.0     DG Chest 2 View Result Date: 07/09/2023 CLINICAL DATA:  walking pneumonia. Productive cough, shortness of breath, chest pain EXAM: CHEST - 2 VIEW COMPARISON:  05/13/2019 FINDINGS: The heart size and mediastinal contours are within normal limits. Both lungs are clear. The visualized skeletal structures are unremarkable. IMPRESSION: No active cardiopulmonary disease. Electronically Signed   By: Franky Crease M.D.   On: 07/09/2023 16:25    Elevated ferritin # # ELEVATED FERRITIN: Patient slightly elevated ferritin [JUNE 2025- > 400; I sat- 26%], I discussed the causes of elevated ferritin could be multiple-including not limited to hereditary hemochromatosis. inflammation/liver disease/metabolic syndrome etc. recommend rule out any genetic causes.  # Clinical labs not suggestive of any hereditary hemochromatosis-likely suggestive of metabolic syndrome.     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  # Fatty liver- -[incidental- US  2023- PCP]-patient stated that  she is diligent with her diet [low carbs]; however does not exercise.  Recommend increasing -physical activity.  If no significant weight loss noted recommend following with PCP.   Thank you for allowing me to participate in the care of your pleasant patient. Please do not hesitate to contact me with questions or concerns in the interim.   # DISPOSITION: # [facility]-hemochromatosis genotyping # follow up in 2-3 weeks-MD-  No labs- Dr.B  All questions were answered. The patient knows to call the clinic with any proIblems, questions or concerns.    Cindy JONELLE Joe, MD 08/04/2023 11:49 AM

## 2023-08-04 NOTE — Assessment & Plan Note (Addendum)
# #   ELEVATED FERRITIN: Patient slightly elevated ferritin [JUNE 2025- > 400; I sat- 26%], I discussed the causes of elevated ferritin could be multiple-including not limited to hereditary hemochromatosis. inflammation/liver disease/metabolic syndrome etc. recommend rule out any genetic causes.  # Clinical labs not suggestive of any hereditary hemochromatosis-likely suggestive of metabolic syndrome.     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  # Fatty liver- -[incidental- US  2023- PCP]-patient stated that she is diligent with her diet [low carbs]; however does not exercise.  Recommend increasing -physical activity.  If no significant weight loss noted recommend following with PCP.   Thank you for allowing me to participate in the care of your pleasant patient. Please do not hesitate to contact me with questions or concerns in the interim.   # DISPOSITION: # [facility]-hemochromatosis genotyping # follow up in 2-3 weeks-MD-  No labs- Dr.B

## 2023-08-21 ENCOUNTER — Encounter: Payer: Self-pay | Admitting: Internal Medicine

## 2023-08-25 ENCOUNTER — Ambulatory Visit
Admission: RE | Admit: 2023-08-25 | Discharge: 2023-08-25 | Disposition: A | Discharge status: Home / Self Care | Source: Ambulatory Visit | Attending: Nurse Practitioner | Admitting: Nurse Practitioner

## 2023-08-25 DIAGNOSIS — Z1231 Encounter for screening mammogram for malignant neoplasm of breast: Secondary | ICD-10-CM | POA: Diagnosis present

## 2023-08-27 ENCOUNTER — Inpatient Hospital Stay: Attending: Internal Medicine | Admitting: Internal Medicine

## 2023-08-27 ENCOUNTER — Encounter: Payer: Self-pay | Admitting: Internal Medicine

## 2023-08-27 VITALS — BP 130/93 | HR 71 | Temp 98.3°F | Resp 16 | Ht 61.0 in | Wt 183.7 lb

## 2023-08-27 DIAGNOSIS — E039 Hypothyroidism, unspecified: Secondary | ICD-10-CM | POA: Insufficient documentation

## 2023-08-27 DIAGNOSIS — R7989 Other specified abnormal findings of blood chemistry: Secondary | ICD-10-CM | POA: Diagnosis present

## 2023-08-27 NOTE — Progress Notes (Signed)
 No concerns today

## 2023-08-27 NOTE — Assessment & Plan Note (Addendum)
# #   ELEVATED FERRITIN: Patient slightly elevated ferritin [JUNE 2025- > 400; I sat- 26%], JULY 2025 hereditary hemochromatosis- NEGATIVE - Workup not suggestive of any hereditary hemochromatosis-likely suggestive of metabolic syndrome.  No further workup recommended.   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  # Fatty liver- -[incidental- US  2023- PCP]-patient stated that she is diligent with her diet [low carbs]; recommend exercise and consideration of weight loss medication-defer to PCP.  #Since patient is clinically stable I think is reasonable for the patient to follow-up with PCP/can follow-up with us  as needed.  Patient comfortable with the plan; to call us  if any questions or concerns in the interim. Can follow up with PCP re: ferritin checks.   # DISPOSITION: # follow up as needed- Dr.B

## 2023-08-27 NOTE — Progress Notes (Signed)
 [facility] CONSULT NOTE  Patient Care Team: Liana Fish, NP as PCP - General (Nurse Practitioner) Therisa Bi, MD as Consulting Physician (Surgery) Rennie Cindy SAUNDERS, MD as Consulting Physician (Oncology)  CHIEF COMPLAINTS/PURPOSE OF CONSULTATION: Elevated Ferritn.    HEMATOLOGY HISTORY  #  FERRITIN:  Iron sat:  HISTORY OF PRESENTING ILLNESS: Patient ambulating-independently. Alone   Beverly Campbell 58 y.o.  female his here to evaluate /work-up- elevated studies-ferritin.  Patient denies any signs or symptoms at this time.  Review of Systems  Constitutional:  Negative for chills, diaphoresis, fever, malaise/fatigue and weight loss.  HENT:  Negative for nosebleeds and sore throat.   Eyes:  Negative for double vision.  Respiratory:  Negative for cough, hemoptysis, sputum production, shortness of breath and wheezing.   Cardiovascular:  Negative for chest pain, palpitations, orthopnea and leg swelling.  Gastrointestinal:  Negative for abdominal pain, blood in stool, constipation, diarrhea, heartburn, melena, nausea and vomiting.  Genitourinary:  Negative for dysuria, frequency and urgency.  Musculoskeletal:  Positive for back pain and joint pain.  Skin: Negative.  Negative for itching and rash.  Neurological:  Negative for dizziness, tingling, focal weakness, weakness and headaches.  Endo/Heme/Allergies:  Does not bruise/bleed easily.  Psychiatric/Behavioral:  Negative for depression. The patient is not nervous/anxious and does not have insomnia.     MEDICAL HISTORY:  Past Medical History:  Diagnosis Date   Anemia    Asthma    Hypothyroidism    Thyroid  disease     SURGICAL HISTORY: Past Surgical History:  Procedure Laterality Date   COLONOSCOPY WITH PROPOFOL  N/A 01/01/2016   Procedure: COLONOSCOPY WITH PROPOFOL ;  Surgeon: Bi Therisa, MD;  Location: [facility] ENDOSCOPY;  Service: Endoscopy;  Laterality: N/A;   ESOPHAGOGASTRODUODENOSCOPY  (EGD) WITH PROPOFOL  N/A 01/01/2016   Procedure: ESOPHAGOGASTRODUODENOSCOPY (EGD) WITH PROPOFOL ;  Surgeon: Bi Therisa, MD;  Location: [facility] ENDOSCOPY;  Service: Endoscopy;  Laterality: N/A;   THYROID  SURGERY  1997   had thyroid  removed   TUBAL LIGATION  1995    SOCIAL HISTORY: Social History   Socioeconomic History   Marital status: Divorced    Spouse name: Not on file   Number of children: Not on file   Years of education: Not on file   Highest education level: Not on file  Occupational History   Not on file  Tobacco Use   Smoking status: Never   Smokeless tobacco: Never  Vaping Use   Vaping status: Never Used  Substance and Sexual Activity   Alcohol use: Yes    Comment: occasionally   Drug use: No   Sexual activity: Yes    Birth control/protection: None  Other Topics Concern   Not on file  Social History Narrative   Not on file   Social Drivers of Health   Financial Resource Strain: Not on file  Food Insecurity: No Food Insecurity (08/04/2023)   Hunger Vital Sign    Worried About Running Out of Food in the Last Year: Never true    Ran Out of Food in the Last Year: Never true  Transportation Needs: No Transportation Needs (08/04/2023)   PRAPARE - Administrator, Civil Service (Medical): No    Lack of Transportation (Non-Medical): No  Physical Activity: Not on file  Stress: Not on file  Social Connections: Not on file  Intimate Partner Violence: Not At Risk (08/04/2023)   Humiliation, Afraid, Rape, and Kick questionnaire    Fear of Current or Ex-Partner: No    Emotionally Abused:  No    Physically Abused: No    Sexually Abused: No    FAMILY HISTORY: Family History  Problem Relation Age of Onset   Colon cancer Father    Hypertension Mother    Diabetes Mother    Breast cancer Neg Hx     ALLERGIES:  has no known allergies.  MEDICATIONS:  Current Outpatient Medications  Medication Sig Dispense Refill   albuterol  (PROVENTIL ) (2.5 MG/3ML) 0.083%  nebulizer solution Take 3 mLs (2.5 mg total) by nebulization every 6 (six) hours as needed for wheezing or shortness of breath. 75 mL 12   albuterol  (VENTOLIN  HFA) 108 (90 Base) MCG/ACT inhaler Inhale 2 puffs into the lungs every 6 (six) hours as needed for wheezing or shortness of breath. 18 g 3   benzonatate  (TESSALON ) 200 MG capsule Take 1 capsule (200 mg total) by mouth 2 (two) times daily as needed for cough. 30 capsule 0   budesonide -formoterol  (SYMBICORT ) 160-4.5 MCG/ACT inhaler Inhale 2 puffs into the lungs in the morning and at bedtime. 1 each 12   levothyroxine  (SYNTHROID ) 100 MCG tablet TAKE 1 TABLET BY MOUTH DAILY 90 tablet 3   Multiple Vitamins-Iron (MULTIVITAMIN/IRON PO) Take by mouth.     triamcinolone  (KENALOG ) 0.025 % cream Apply 1 application topically 2 (two) times daily. 80 g 2   No current facility-administered medications for this visit.      PHYSICAL EXAMINATION:   Vitals:   08/27/23 1442  BP: (!) 130/93  Pulse: 71  Resp: 16  Temp: 98.3 F (36.8 C)  SpO2: 95%    Filed Weights   08/27/23 1442  Weight: 183 lb 11.2 oz (83.3 kg)     Physical Exam Vitals and nursing note reviewed.  HENT:     Head: Normocephalic and atraumatic.     Mouth/Throat:     Pharynx: Oropharynx is clear.  Eyes:     Extraocular Movements: Extraocular movements intact.     Pupils: Pupils are equal, round, and reactive to light.  Cardiovascular:     Rate and Rhythm: Normal rate and regular rhythm.  Pulmonary:     Comments: Decreased breath sounds bilaterally.  Abdominal:     Palpations: Abdomen is soft.  Musculoskeletal:        General: Normal range of motion.     Cervical back: Normal range of motion.  Skin:    General: Skin is warm.  Neurological:     General: No focal deficit present.     Mental Status: She is alert and oriented to person, place, and time.  Psychiatric:        Behavior: Behavior normal.        Judgment: Judgment normal.     LABORATORY DATA:  I  have reviewed the data as listed Lab Results  Component Value Date   WBC 3.7 07/09/2023   HGB 12.9 07/09/2023   HCT 41.1 07/09/2023   MCV 64 (L) 07/09/2023   PLT 243 07/09/2023   Recent Labs    07/09/23 1612  NA 139  K 4.7  CL 100  CO2 23  GLUCOSE 100*  BUN 16  CREATININE 0.85  CALCIUM 9.9  PROT 7.4  ALBUMIN 4.5  AST 26  ALT 27  ALKPHOS 81  BILITOT 1.0     MM 3D SCREENING MAMMOGRAM BILATERAL BREAST Result Date: 08/27/2023 CLINICAL DATA:  Screening. EXAM: DIGITAL SCREENING BILATERAL MAMMOGRAM WITH TOMOSYNTHESIS AND CAD TECHNIQUE: Bilateral screening digital craniocaudal and mediolateral oblique mammograms were obtained. Bilateral screening digital breast tomosynthesis  was performed. The images were evaluated with computer-aided detection. COMPARISON:  Previous exam(s). ACR Breast Density Category c: The breasts are heterogeneously dense, which may obscure small masses. FINDINGS: There are no findings suspicious for malignancy. IMPRESSION: No mammographic evidence of malignancy. A result letter of this screening mammogram will be mailed directly to the patient. RECOMMENDATION: Screening mammogram in one year. (Code:SM-B-01Y) BI-RADS CATEGORY  1: Negative. Electronically Signed   By: Toribio Agreste M.D.   On: 08/27/2023 12:49    Elevated ferritin # # ELEVATED FERRITIN: Patient slightly elevated ferritin [JUNE 2025- > 400; I sat- 26%], JULY 2025 hereditary hemochromatosis- NEGATIVE - Workup not suggestive of any hereditary hemochromatosis-likely suggestive of metabolic syndrome.  No further workup recommended.   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  # Fatty liver- -[incidental- US  2023- PCP]-patient stated that she is diligent with her diet [low carbs]; recommend exercise and consideration of weight loss medication-defer to PCP.  #Since patient is clinically stable I think is reasonable for the patient to follow-up with PCP/can follow-up  with us  as needed.  Patient comfortable with the plan; to call us  if any questions or concerns in the interim. Can follow up with PCP re: ferritin checks.   # DISPOSITION: # follow up as needed- Dr.B  All questions were answered. The patient knows to call the clinic with any proIblems, questions or concerns.    Cindy JONELLE Joe, MD 08/27/2023 3:02 PM

## 2023-10-20 ENCOUNTER — Ambulatory Visit: Admitting: Internal Medicine

## 2023-10-20 ENCOUNTER — Ambulatory Visit: Payer: Managed Care, Other (non HMO) | Admitting: Physician Assistant

## 2024-06-09 ENCOUNTER — Encounter: Admitting: Nurse Practitioner
